# Patient Record
Sex: Male | Born: 1995 | Race: Black or African American | Hispanic: No | Marital: Single | Smoking: Former smoker
Health system: Southern US, Community
[De-identification: ages and names within clinical notes are randomized; demographics above are authoritative.]

## PROBLEM LIST (undated history)

## (undated) DIAGNOSIS — F988 Other specified behavioral and emotional disorders with onset usually occurring in childhood and adolescence: Secondary | ICD-10-CM

## (undated) DIAGNOSIS — J45909 Unspecified asthma, uncomplicated: Secondary | ICD-10-CM

## (undated) DIAGNOSIS — F259 Schizoaffective disorder, unspecified: Secondary | ICD-10-CM

## (undated) DIAGNOSIS — F32A Depression, unspecified: Secondary | ICD-10-CM

## (undated) DIAGNOSIS — F329 Major depressive disorder, single episode, unspecified: Secondary | ICD-10-CM

---
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
## 2015-06-02 ENCOUNTER — Ambulatory Visit (HOSPITAL_COMMUNITY)
Admission: AD | Admit: 2015-06-02 | Discharge: 2015-06-02 | Disposition: A | Discharge status: Home / Self Care | Payer: Medicaid Other | Attending: Psychiatry | Admitting: Psychiatry

## 2015-06-02 NOTE — BH Assessment (Signed)
Tele Assessment Note   Lawrence [name] is an 19 y.o. male. Pt denies  SI. Reports passive HI. Pt reports depressive symptoms, anxiety, and anger issues. Pt reports ongoing issues with his mother. Pt reports current drug use. Pt states he uses marijuana, cocaine, Xanax, and alcohol. Pt denies current mental health treatment. Pt admitted to previous outpatient treatment. Pt states he did not follow through with therapy or medication management. Pt reports the following depressive symptoms: (a) depressed mood most of the day, (b) difficulty getting out of bed, (c) irritablity, (d), and increased anger. Pt denies previous abuse.   Writer consulted with Dr. Dub Mikes. Dr. Dub Mikes sat down with the Pt to access the Pt and his current situation. After Dr. Dub Mikes had a discussion with the Pt he recommended outpatient resources. Pt provided with outpatient resources.   Diagnosis:  Major Depressive Disorder, Recurrent, Severe  Past Medical History:  Past Medical History  Diagnosis Date  . Asthma   . Depressed   . ADD (attention deficit disorder)     No past surgical history on file.  Family History: No family history on file.  Social History:  reports that he has been smoking.  He does not have any smokeless tobacco history on file. He reports that he uses illicit drugs (Marijuana). He reports that he does not drink alcohol.  Additional Social History:  Alcohol / Drug Use Pain Medications: Pt denies Prescriptions: Pt denies  Over the Counter: Pt denies History of alcohol / drug use?: Yes Longest period of sobriety (when/how long): NA  Substance #1 Name of Substance 1: Cocaine 1 - Age of First Use: 15 1 - Amount (size/oz): Unknown 1 - Frequency: NA 1 - Duration: NA 1 - Last Use / Amount: NA Substance #2 Name of Substance 2: Alcohol 2 - Age of First Use: 15 2 - Amount (size/oz): Unknown 2 - Frequency: NA 2 - Duration: NA 2 - Last Use / Amount: NA Substance #3 Name of Substance 3: Marijuana 3 -  Age of First Use: 16 3 - Amount (size/oz): Unknown 3 - Frequency: NA 3 - Duration: NA 3 - Last Use / Amount: NA  CIWA:   COWS:    PATIENT STRENGTHS: (choose at least two) Average or above average intelligence Communication skills  Allergies:  Allergies  Allergen Reactions  . Apple   . Corn-Containing Products     Home Medications:  (Not in a hospital admission)  OB/GYN Status:  No LMP for male patient.  General Assessment Data Location of Assessment: [facility] TTS Assessment: In system Is this a Tele or Face-to-Face Assessment?: Face-to-Face Is this an Initial Assessment or a Re-assessment for this encounter?: Initial Assessment Marital status: Single Maiden name: NA Is patient pregnant?: No Pregnancy Status: No Living Arrangements: Parent Can pt return to current living arrangement?: Yes Admission Status: Voluntary Is patient capable of signing voluntary admission?: Yes Referral Source: Self/Family/Friend Insurance type: Medicaid     Crisis Care Plan Living Arrangements: Parent Name of Psychiatrist: NA Name of Therapist: NA  Education Status Is patient currently in school?: Yes Current Grade: freshmen in college Highest grade of school patient has completed: 12 Name of school: NA Contact person: NA  Risk to self with the past 6 months Suicidal Ideation: No Has patient been a risk to self within the past 6 months prior to admission? : No Suicidal Intent: No Has patient had any suicidal intent within the past 6 months prior to admission? : No Is patient  at risk for suicide?: Yes (excessive drug use) Suicidal Plan?: No Has patient had any suicidal plan within the past 6 months prior to admission? : No Access to Means: No What has been your use of drugs/alcohol within the last 12 months?: cocaine, xanax, marijuana, alcohol Previous Attempts/Gestures: No How many times?: 0 Other Self Harm Risks: NA Triggers for Past Attempts: None  known Intentional Self Injurious Behavior: None Family Suicide History: No Recent stressful life event(s): Conflict (Comment) (conflict with mother) Persecutory voices/beliefs?: No Depression: Yes Depression Symptoms: Despondent, Insomnia, Tearfulness, Isolating, Fatigue, Guilt, Feeling worthless/self pity, Loss of interest in usual pleasures, Feeling angry/irritable Substance abuse history and/or treatment for substance abuse?: No Suicide prevention information given to non-admitted patients: Not applicable  Risk to Others within the past 6 months Homicidal Ideation: Yes-Currently Present Does patient have any lifetime risk of violence toward others beyond the six months prior to admission? : No Thoughts of Harm to Others: Yes-Currently Present Comment - Thoughts of Harm to Others: Pt states thoughts of harm for everyone Current Homicidal Intent: No Current Homicidal Plan: No Access to Homicidal Means: No Identified Victim: Pt states everybody History of harm to others?: No Assessment of Violence: On admission Violent Behavior Description: verbal altercations Does patient have access to weapons?: No Criminal Charges Pending?: No Does patient have a court date: No Is patient on probation?: No  Psychosis Hallucinations: None noted Delusions: None noted  Mental Status Report Appearance/Hygiene: Unremarkable Eye Contact: Fair Motor Activity: Freedom of movement Speech: Logical/coherent Level of Consciousness: Alert Mood: Depressed Affect: Depressed Anxiety Level: None Thought Processes: Coherent, Relevant Judgement: Unimpaired Orientation: Person, Place, Time, Situation, Appropriate for developmental age Obsessive Compulsive Thoughts/Behaviors: None  Cognitive Functioning Concentration: Normal Memory: Recent Intact, Remote Intact IQ: Average Insight: Poor Impulse Control: Poor Appetite: Fair Weight Loss: 0 Weight Gain: 0 Sleep: Decreased Total Hours of Sleep:  5 Vegetative Symptoms: None  ADLScreening [facility]) Patient's cognitive ability adequate to safely complete daily activities?: Yes Patient able to express need for assistance with ADLs?: Yes Independently performs ADLs?: Yes (appropriate for developmental age)  Prior Inpatient Therapy Prior Inpatient Therapy: No Prior Therapy Dates: NA Prior Therapy Facilty/Provider(s): NA Reason for Treatment: NA  Prior Outpatient Therapy Prior Outpatient Therapy: No Prior Therapy Dates: NA Prior Therapy Facilty/Provider(s): NA Reason for Treatment: NA Does patient have an ACCT team?: No Does patient have Intensive In-House Services?  : No Does patient have [facility] services? : No Does patient have P4CC services?: No  ADL Screening (condition at time of admission) Patient's cognitive ability adequate to safely complete daily activities?: Yes Is the patient deaf or have difficulty hearing?: No Does the patient have difficulty seeing, even when wearing glasses/contacts?: No Does the patient have difficulty concentrating, remembering, or making decisions?: No Patient able to express need for assistance with ADLs?: Yes Does the patient have difficulty dressing or bathing?: No Independently performs ADLs?: Yes (appropriate for developmental age) Does the patient have difficulty walking or climbing stairs?: No Weakness of Legs: None Weakness of Arms/Hands: None       Abuse/Neglect Assessment (Assessment to be complete while patient is alone) Physical Abuse: Denies Verbal Abuse: Denies Sexual Abuse: Denies Exploitation of patient/patient's resources: Denies Self-Neglect: Denies Values / Beliefs Cultural Requests During Hospitalization: None Spiritual Requests During Hospitalization: None   Advance Directives (For Healthcare) Does patient have an advance directive?: No Would patient like information on creating an advanced directive?: No - patient declined information     Additional Information  1:1 In Past 12 Months?: No CIRT Risk: No Elopement Risk: No Does patient have medical clearance?: No     Disposition:  Disposition Initial Assessment Completed for this Encounter: Yes Disposition of Patient: Outpatient treatment (Provided outpatient resources)  Theona Muhs D 06/02/2015 6:59 PM

## 2015-08-11 ENCOUNTER — Encounter (HOSPITAL_COMMUNITY): Payer: Self-pay | Admitting: Neurology

## 2015-08-11 ENCOUNTER — Emergency Department (HOSPITAL_COMMUNITY): Payer: Medicaid Other

## 2015-08-11 ENCOUNTER — Emergency Department (HOSPITAL_COMMUNITY)
Admission: EM | Admit: 2015-08-11 | Discharge: 2015-08-11 | Disposition: A | Discharge status: Home / Self Care | Payer: Medicaid Other | Attending: Emergency Medicine | Admitting: Emergency Medicine

## 2015-08-11 DIAGNOSIS — F17200 Nicotine dependence, unspecified, uncomplicated: Secondary | ICD-10-CM | POA: Diagnosis not present

## 2015-08-11 DIAGNOSIS — J45909 Unspecified asthma, uncomplicated: Secondary | ICD-10-CM | POA: Diagnosis not present

## 2015-08-11 DIAGNOSIS — R079 Chest pain, unspecified: Secondary | ICD-10-CM | POA: Diagnosis present

## 2015-08-11 DIAGNOSIS — R002 Palpitations: Secondary | ICD-10-CM | POA: Diagnosis not present

## 2015-08-11 DIAGNOSIS — Z8659 Personal history of other mental and behavioral disorders: Secondary | ICD-10-CM | POA: Insufficient documentation

## 2015-08-11 LAB — I-STAT CHEM 8, ED
BUN: 9 mg/dL (ref 6–20)
Calcium, Ion: 1.19 mmol/L (ref 1.12–1.23)
Chloride: 99 mmol/L — ABNORMAL LOW (ref 101–111)
Creatinine, Ser: 1.1 mg/dL (ref 0.61–1.24)
GLUCOSE: 84 mg/dL (ref 65–99)
HEMATOCRIT: 48 % (ref 39.0–52.0)
Hemoglobin: 16.3 g/dL (ref 13.0–17.0)
POTASSIUM: 4.8 mmol/L (ref 3.5–5.1)
Sodium: 138 mmol/L (ref 135–145)
TCO2: 28 mmol/L (ref 0–100)

## 2015-08-11 NOTE — ED Notes (Signed)
Pt stable, ambulatory, states understanding of discharge instructions 

## 2015-08-11 NOTE — ED Notes (Signed)
Pt reports today felt like "his heart was hurting" felt that his heart was "bloated". He was driving homer when the feeling started. Pt denies any s/s at current.

## 2015-08-11 NOTE — ED Provider Notes (Signed)
CSN: [id number]     Arrival date & time 08/11/15  1413 History   First MD Initiated Contact with Patient 08/11/15 1559     Chief Complaint  Patient presents with  . Chest Pain      HPI Patient presents to the emergency department complaining of an abnormal sensation in his chest.  He feels like the left side of his chest was slightly swollen when he looked in the mirror.  He started feeling short of breath.  He also had a sensation of palpitations.  He does admit to drinking energy drinks daily.  He denies significant shortness of breath.  He is asymptomatic at this time.  No near-syncope or syncope.  Palpitations were transient.  Denies unilateral leg swelling.  No history DVT or pulmonary embolism.   Past Medical History  Diagnosis Date  . Asthma   . Depressed   . ADD (attention deficit disorder)    History reviewed. No pertinent past surgical history. No family history on file. Social History  Substance Use Topics  . Smoking status: Current Some Day Smoker  . Smokeless tobacco: None  . Alcohol Use: No    Review of Systems  All other systems reviewed and are negative.     Allergies  Bee venom; Apple; Corn-containing products; Lac bovis; and Other  Home Medications   Prior to Admission medications   Medication Sig Start Date End Date Taking? Authorizing Provider  EPINEPHrine (EPIPEN JR) 0.15 MG/0.3ML injection Inject 0.15 mg into the muscle once. 12/12/11  Yes Historical Provider, MD  fluticasone (FLONASE) 50 MCG/ACT nasal spray Place 1 spray into both nostrils daily as needed. 12/01/12  Yes Historical Provider, MD  loratadine (CLARITIN) 10 MG tablet Take 10 mg by mouth daily as needed for allergies.   Yes Historical Provider, MD  montelukast (SINGULAIR) 10 MG tablet Take 10 mg by mouth at bedtime as needed (allergies, asthma).  12/12/11  Yes Historical Provider, MD   BP 115/75 mmHg  Pulse 70  Temp(Src) 98.4 F (36.9 C) (Oral)  Resp 11  SpO2 100% Physical Exam   Constitutional: He is oriented to person, place, and time. He appears well-developed and well-nourished.  HENT:  Head: Normocephalic and atraumatic.  Eyes: EOM are normal.  Neck: Normal range of motion.  Cardiovascular: Normal rate, regular rhythm, normal heart sounds and intact distal pulses.   Pulmonary/Chest: Effort normal and breath sounds normal. No respiratory distress.  Abdominal: Soft. He exhibits no distension. There is no tenderness.  Musculoskeletal: Normal range of motion.  Neurological: He is alert and oriented to person, place, and time.  Skin: Skin is warm and dry.  Psychiatric: He has a normal mood and affect. Judgment normal.  Nursing note and vitals reviewed.   ED Course  Procedures (including critical care time) Labs Review Labs Reviewed  I-STAT CHEM 8, ED - Abnormal; Notable for the following:    Chloride 99 (*)    All other components within normal limits    Imaging Review Dg Chest 2 View  08/11/2015  CLINICAL DATA:  Mid epigastric pain. Shortness of breath, palpitations. Smoker, asthma. EXAM: CHEST  2 VIEW COMPARISON:  None. FINDINGS: The heart size and mediastinal contours are within normal limits. Both lungs are clear. The visualized skeletal structures are unremarkable. IMPRESSION: No active cardiopulmonary disease. Electronically Signed   By: Charlett Nose M.D.   On: 08/11/2015 15:09   I have personally reviewed and evaluated these images and lab results as part of my medical decision-making.  EKG Interpretation   Date/Time:  Thursday August 11 2015 14:25:29 EST Ventricular Rate:  95 PR Interval:  146 QRS Duration: 92 QT Interval:  332 QTC Calculation: 417 R Axis:   -143 Text Interpretation:  Normal sinus rhythm Right superior axis deviation  Possible Right ventricular hypertrophy Abnormal ECG No significant change  was found Confirmed by Katrina Daddona  MD, Caryn BeeKEVIN ([id number]) on 08/11/2015 4:23:19 PM      MDM   Final diagnoses:  Chest pain,  unspecified chest pain type  Palpitations     Patient feels good at this time.  EKG and chest x-ray without abnormality.  Likely much of this was secondary to anxiety.  He is dealing with some depression and is currently in a very introspective state.  He is scheduled to follow-up with his psychiatrist as well.  He is with his mother.  All questions were answered.  He understands return to the ER for new or worsening symptoms.  Azalia BilisKevin Zuha Dejonge, MD 08/11/15 [phone]

## 2016-05-12 ENCOUNTER — Emergency Department (HOSPITAL_COMMUNITY)
Admission: EM | Admit: 2016-05-12 | Discharge: 2016-05-13 | Disposition: A | Discharge status: Other Acute Inpatient Hospital | Payer: Medicaid Other | Attending: Emergency Medicine | Admitting: Emergency Medicine

## 2016-05-12 ENCOUNTER — Encounter (HOSPITAL_COMMUNITY): Payer: Self-pay

## 2016-05-12 DIAGNOSIS — F918 Other conduct disorders: Secondary | ICD-10-CM | POA: Insufficient documentation

## 2016-05-12 DIAGNOSIS — F6381 Intermittent explosive disorder: Secondary | ICD-10-CM

## 2016-05-12 DIAGNOSIS — F1994 Other psychoactive substance use, unspecified with psychoactive substance-induced mood disorder: Secondary | ICD-10-CM | POA: Diagnosis present

## 2016-05-12 DIAGNOSIS — Z79899 Other long term (current) drug therapy: Secondary | ICD-10-CM | POA: Insufficient documentation

## 2016-05-12 DIAGNOSIS — F1220 Cannabis dependence, uncomplicated: Secondary | ICD-10-CM | POA: Diagnosis present

## 2016-05-12 DIAGNOSIS — J45909 Unspecified asthma, uncomplicated: Secondary | ICD-10-CM | POA: Insufficient documentation

## 2016-05-12 DIAGNOSIS — F909 Attention-deficit hyperactivity disorder, unspecified type: Secondary | ICD-10-CM | POA: Insufficient documentation

## 2016-05-12 DIAGNOSIS — F17200 Nicotine dependence, unspecified, uncomplicated: Secondary | ICD-10-CM | POA: Insufficient documentation

## 2016-05-12 DIAGNOSIS — R4689 Other symptoms and signs involving appearance and behavior: Secondary | ICD-10-CM

## 2016-05-12 LAB — RAPID URINE DRUG SCREEN, HOSP PERFORMED
AMPHETAMINES: NOT DETECTED
BARBITURATES: NOT DETECTED
BENZODIAZEPINES: NOT DETECTED
COCAINE: NOT DETECTED
Opiates: NOT DETECTED
TETRAHYDROCANNABINOL: POSITIVE — AB

## 2016-05-12 LAB — COMPREHENSIVE METABOLIC PANEL
ALBUMIN: 4.6 g/dL (ref 3.5–5.0)
ALT: 25 U/L (ref 17–63)
ANION GAP: 6 (ref 5–15)
AST: 32 U/L (ref 15–41)
Alkaline Phosphatase: 59 U/L (ref 38–126)
BUN: 9 mg/dL (ref 6–20)
CHLORIDE: 104 mmol/L (ref 101–111)
CO2: 28 mmol/L (ref 22–32)
Calcium: 9.5 mg/dL (ref 8.9–10.3)
Creatinine, Ser: 1.05 mg/dL (ref 0.61–1.24)
GFR calc non Af Amer: >60 mL/min (ref >60)
GLUCOSE: 106 mg/dL — AB (ref 65–99)
Potassium: 4.2 mmol/L (ref 3.5–5.1)
SODIUM: 138 mmol/L (ref 135–145)
Total Bilirubin: 0.5 mg/dL (ref 0.3–1.2)
Total Protein: 7.6 g/dL (ref 6.5–8.1)

## 2016-05-12 LAB — CBC WITH DIFFERENTIAL/PLATELET
BASOS PCT: 0 %
Basophils Absolute: 0 10*3/uL (ref 0.0–0.1)
EOS PCT: 4 %
Eosinophils Absolute: 0.3 10*3/uL (ref 0.0–0.7)
HEMATOCRIT: 44.2 % (ref 39.0–52.0)
Hemoglobin: 15 g/dL (ref 13.0–17.0)
Lymphocytes Relative: 21 %
Lymphs Abs: 1.6 10*3/uL (ref 0.7–4.0)
MCH: 30.2 pg (ref 26.0–34.0)
MCHC: 33.9 g/dL (ref 30.0–36.0)
MCV: 89.1 fL (ref 78.0–100.0)
MONO ABS: 0.6 10*3/uL (ref 0.1–1.0)
MONOS PCT: 8 %
Neutro Abs: 5 10*3/uL (ref 1.7–7.7)
Neutrophils Relative %: 67 %
PLATELETS: 177 10*3/uL (ref 150–400)
RBC: 4.96 MIL/uL (ref 4.22–5.81)
RDW: 13.3 % (ref 11.5–15.5)
WBC: 7.5 10*3/uL (ref 4.0–10.5)

## 2016-05-12 LAB — ETHANOL

## 2016-05-12 MED ORDER — LORAZEPAM 1 MG PO TABS: 1.0000 mg | ORAL_TABLET | Freq: Three times a day (TID) | ORAL | Status: DC | PRN

## 2016-05-12 MED ORDER — ACETAMINOPHEN 325 MG PO TABS
650.0000 mg | ORAL_TABLET | ORAL | Status: DC | PRN
Start: 2016-05-12 — End: 2016-05-13

## 2016-05-12 NOTE — ED Notes (Signed)
Patient noted sleeping in room. No complaints, stable, in no acute distress. Q15 minute rounds and monitoring via Security Cameras to continue.  

## 2016-05-12 NOTE — ED Notes (Signed)
Pt. Transferred to [facility] from ED to room. Report to include Situation, Background, Assessment and [facility]ations from [facility]. Pt. Oriented to unit including Q15 minute rounds as well as the security cameras for their protection. Patient is alert and oriented, warm and dry in no acute distress. Patient denies SI, HI, and AVH. Pt. Encouraged to let me know if needs arise.
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
## 2016-05-12 NOTE — BH Assessment (Signed)
[facility] Assessment Progress Note   Case was staffed with Lord DNP who recommended patient be re-evaluated in the a.m.    
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
## 2016-05-12 NOTE — ED Notes (Signed)
Pt wanded by security. 

## 2016-05-12 NOTE — ED Notes (Signed)
[facility]: [facility] Expected date:  Expected time:  Means of arrival:  Comments: 
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
## 2016-05-13 ENCOUNTER — Encounter (HOSPITAL_COMMUNITY): Payer: Self-pay | Admitting: *Deleted

## 2016-05-13 ENCOUNTER — Inpatient Hospital Stay (HOSPITAL_COMMUNITY)
Admission: AD | Admit: 2016-05-13 | Discharge: 2016-05-18 | DRG: 883 | Disposition: A | Discharge status: Home / Self Care | Payer: Medicaid Other | Attending: Psychiatry | Admitting: Psychiatry

## 2016-05-13 DIAGNOSIS — R45851 Suicidal ideations: Secondary | ICD-10-CM

## 2016-05-13 DIAGNOSIS — F1220 Cannabis dependence, uncomplicated: Secondary | ICD-10-CM | POA: Diagnosis present

## 2016-05-13 DIAGNOSIS — F332 Major depressive disorder, recurrent severe without psychotic features: Secondary | ICD-10-CM | POA: Diagnosis not present

## 2016-05-13 DIAGNOSIS — F6381 Intermittent explosive disorder: Principal | ICD-10-CM | POA: Diagnosis present

## 2016-05-13 DIAGNOSIS — F1994 Other psychoactive substance use, unspecified with psychoactive substance-induced mood disorder: Secondary | ICD-10-CM | POA: Diagnosis present

## 2016-05-13 DIAGNOSIS — Z818 Family history of other mental and behavioral disorders: Secondary | ICD-10-CM | POA: Diagnosis not present

## 2016-05-13 DIAGNOSIS — J45909 Unspecified asthma, uncomplicated: Secondary | ICD-10-CM | POA: Diagnosis present

## 2016-05-13 DIAGNOSIS — F17200 Nicotine dependence, unspecified, uncomplicated: Secondary | ICD-10-CM | POA: Diagnosis present

## 2016-05-13 DIAGNOSIS — Z79899 Other long term (current) drug therapy: Secondary | ICD-10-CM | POA: Diagnosis not present

## 2016-05-13 DIAGNOSIS — F909 Attention-deficit hyperactivity disorder, unspecified type: Secondary | ICD-10-CM | POA: Diagnosis present

## 2016-05-13 DIAGNOSIS — F329 Major depressive disorder, single episode, unspecified: Secondary | ICD-10-CM

## 2016-05-13 DIAGNOSIS — F32A Depression, unspecified: Secondary | ICD-10-CM

## 2016-05-13 MED ORDER — TRAZODONE HCL 50 MG PO TABS
50.0000 mg | ORAL_TABLET | Freq: Every day | ORAL | Status: DC
  Administered 2016-05-13 – 2016-05-16 (×2): 50 mg via ORAL
  Filled 2016-05-13: qty 7
  Filled 2016-05-13 (×7): qty 1

## 2016-05-13 MED ORDER — OXCARBAZEPINE 300 MG PO TABS
300.0000 mg | ORAL_TABLET | Freq: Two times a day (BID) | ORAL | Status: DC
  Administered 2016-05-13: 300 mg via ORAL
  Filled 2016-05-13: qty 1

## 2016-05-13 MED ORDER — MAGNESIUM HYDROXIDE 400 MG/5ML PO SUSP
30.0000 mL | Freq: Every day | ORAL | Status: DC | PRN
Start: 2016-05-13 — End: 2016-05-18

## 2016-05-13 MED ORDER — ACETAMINOPHEN 325 MG PO TABS: 650.0000 mg | ORAL_TABLET | ORAL | Status: DC | PRN

## 2016-05-13 MED ORDER — TRAZODONE HCL 50 MG PO TABS: 50.0000 mg | ORAL_TABLET | Freq: Every day | ORAL | Status: DC

## 2016-05-13 MED ORDER — OXCARBAZEPINE 300 MG PO TABS
300.0000 mg | ORAL_TABLET | Freq: Two times a day (BID) | ORAL | Status: DC
  Administered 2016-05-13 – 2016-05-18 (×10): 300 mg via ORAL
  Filled 2016-05-13 (×7): qty 1
  Filled 2016-05-13: qty 14
  Filled 2016-05-13 (×5): qty 1
  Filled 2016-05-13: qty 14
  Filled 2016-05-13 (×2): qty 1

## 2016-05-13 MED ORDER — ALUM & MAG HYDROXIDE-SIMETH 200-200-20 MG/5ML PO SUSP: 30.0000 mL | ORAL | Status: DC | PRN

## 2016-05-13 NOTE — ED Notes (Signed)
Patient noted sleeping in room. No complaints, stable, in no acute distress. Q15 minute rounds and monitoring via Security Cameras to continue.  

## 2016-05-13 NOTE — ED Notes (Signed)
On the phone 

## 2016-05-13 NOTE — ED Notes (Signed)
Pt ambulatory w/o difficulty to [facility] w/ [facility], belongings given to [employer] 
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
## 2016-05-13 NOTE — ED Notes (Signed)
[facility] here to transport 

## 2016-05-13 NOTE — Tx Team (Signed)
Initial Treatment Plan 05/13/2016 6:39 PM Lawrence Minusameron Knecht [id number]    PATIENT STRESSORS: Educational concerns Financial difficulties Health problems   PATIENT STRENGTHS: Ability for insight Active sense of humor Average or above average intelligence   PATIENT IDENTIFIED PROBLEMS: ADD Asthma MDD Polysubstnace Abuse                " I'm not a bad person"05/13/16  " I love my mother" 9/24/`7   DISCHARGE CRITERIA:  Ability to meet basic life and health needs Adequate post-discharge living arrangements Improved stabilization in mood, thinking, and/or behavior Medical problems require only outpatient monitoring  PRELIMINARY DISCHARGE PLAN: Attend aftercare/continuing care group Attend PHP/IOP Attend 12-step recovery group Outpatient therapy  PATIENT/FAMILY INVOLVEMENT: This treatment plan has been presented to and reviewed with the patient, Lawrence [name], and/or family member, .  The patient and family have been given the opportunity to ask questions and make suggestions.  Rich Braveuke, Donoven Pett Lynn, RN 05/13/2016, 6:39 PM

## 2016-05-13 NOTE — ED Notes (Signed)
[facility] contacted for transport 

## 2016-05-13 NOTE — Consult Note (Signed)
[facility] Face-to-Face Psychiatry Consult   Reason for Consult: suicidal thoughts, aggressive behavior Referring Physician: EDP Patient Identification: Lawrence [name] MRN:  [id number] Principal Diagnosis: Intermittent explosive disorder Diagnosis:   Patient Active Problem List   Diagnosis Date Noted  . Intermittent explosive disorder [F63.81] 05/13/2016    Priority: High  . Cannabis use disorder, severe, dependence (HCC) [F12.20] 05/13/2016    Priority: High  . Substance induced mood disorder (HCC) [F19.94] 05/13/2016    Priority: High  . Intoxication by drug [facility]) [Z57.471] 07/12/2014  . Intoxication [R69] 07/12/2014  . Depression [F32.9] 07/12/2014  . Overdose of antitussive [T48.3X1A]     Total Time spent with patient: 45 minutes  Subjective:   Lawrence [name] is a 20 y.o. male patient admitted with physically aggressive.  HPI:  Lawrence [name] is an 20 y.o. male with past history of Polysubstance abuse, ADHD, Anxiety and depression. Patient was brought to [facility] under IVC taking out by his mother. Patient was interviewed in the presence of his mother and he reported that he was brought to the hospital after arguments with his mother. Mother reports that patient verbalized suicidal thoughts yesterday and stated that he wanted to die and be with Jesus. Patient has been getting increasingly agitated, hostile, physically aggressive towards his mother and has been punching holes in the wall, throws things, yells and cries for no reasons. Patient reports that he has been prescribed medications in the past but never took it. He has been self medicating with drugs.Patient reports prior history of  cocaine, Xanax, alcohol abuse and current addiction to Marijuana.  Patient is extremely labile but denies delusions, psychosis, SH/HI.  Past Psychiatric History: as above  Risk to Self: Suicidal Ideation: No Suicidal Intent: No Is patient at risk for suicide?: No Suicidal Plan?: No Access to Means:  No What has been your use of drugs/alcohol within the last 12 months?: denies current use How many times?: 1 Other Self Harm Risks: none Triggers for Past Attempts: Unknown Intentional Self Injurious Behavior: None Risk to Others: Homicidal Ideation: No Thoughts of Harm to Others: No Current Homicidal Intent: No Current Homicidal Plan: No Access to Homicidal Means: No Identified Victim: na History of harm to others?: No Assessment of Violence: None Noted Violent Behavior Description: patient did punch hole in wall Does patient have access to weapons?: No Criminal Charges Pending?: No Does patient have a court date: No Prior Inpatient Therapy: Prior Inpatient Therapy: No Prior Therapy Dates: na Prior Therapy Facilty/Provider(s): na Reason for Treatment: na Prior Outpatient Therapy: Prior Outpatient Therapy: No Prior Therapy Dates: na Prior Therapy Facilty/Provider(s): na Reason for Treatment: na Does patient have an ACCT team?: No Does patient have Intensive In-House Services?  : No Does patient have [facility] services? : No Does patient have P4CC services?: No  Past Medical History:  Past Medical History:  Diagnosis Date  . ADD (attention deficit disorder)   . Asthma   . Depressed    History reviewed. No pertinent surgical history. Family History: No family history on file. Family Psychiatric  History: Social History:  History  Alcohol Use No     History  Drug Use  . Types: Marijuana    Social History   Social History  . Marital status: Single    Spouse name: N/A  . Number of children: N/A  . Years of education: N/A   Social History Main Topics  . Smoking status: Current Some Day Smoker  . Smokeless tobacco: Never Used  . Alcohol use  No  . Drug use:     Types: Marijuana  . Sexual activity: Not Asked   Other Topics Concern  . None   Social History Narrative  . None   Additional Social History:    Allergies:   Allergies  Allergen Reactions  .  Bee Venom Anaphylaxis  . Apple   . Corn-Containing Products   . Lac Bovis Itching    Milk Related  . Other     Green Beans:   Other reaction(s): ASTHMA, RASH    Labs:  Results for orders placed or performed during the hospital encounter of 05/12/16 (from the past 48 hour(s))  Comprehensive metabolic panel     Status: Abnormal   Collection Time: 05/12/16  5:40 PM  Result Value Ref Range   Sodium 138 135 - 145 mmol/L   Potassium 4.2 3.5 - 5.1 mmol/L   Chloride 104 101 - 111 mmol/L   CO2 28 22 - 32 mmol/L   Glucose, Bld 106 (H) 65 - 99 mg/dL   BUN 9 6 - 20 mg/dL   Creatinine, Ser 1.05 0.61 - 1.24 mg/dL   Calcium 9.5 8.9 - 10.3 mg/dL   Total Protein 7.6 6.5 - 8.1 g/dL   Albumin 4.6 3.5 - 5.0 g/dL   AST 32 15 - 41 U/L   ALT 25 17 - 63 U/L   Alkaline Phosphatase 59 38 - 126 U/L   Total Bilirubin 0.5 0.3 - 1.2 mg/dL   GFR calc non Af Amer >60 >60 mL/min   GFR calc Af Amer >60 >60 mL/min    Comment: (NOTE) The eGFR has been calculated using the CKD EPI equation. This calculation has not been validated in all clinical situations. eGFR's persistently <60 mL/min signify possible Chronic Kidney Disease.    Anion gap 6 5 - 15  Urine rapid drug screen (hosp performed)     Status: Abnormal   Collection Time: 05/12/16  5:40 PM  Result Value Ref Range   Opiates NONE DETECTED NONE DETECTED   Cocaine NONE DETECTED NONE DETECTED   Benzodiazepines NONE DETECTED NONE DETECTED   Amphetamines NONE DETECTED NONE DETECTED   Tetrahydrocannabinol POSITIVE (A) NONE DETECTED   Barbiturates NONE DETECTED NONE DETECTED    Comment:        DRUG SCREEN FOR MEDICAL PURPOSES ONLY.  IF CONFIRMATION IS NEEDED FOR ANY PURPOSE, NOTIFY LAB WITHIN 5 DAYS.        LOWEST DETECTABLE LIMITS FOR URINE DRUG SCREEN Drug Class       Cutoff (ng/mL) Amphetamine      1000 Barbiturate      200 Benzodiazepine   762 Tricyclics       831 Opiates          300 Cocaine          300 THC              50   Ethanol      Status: None   Collection Time: 05/12/16  5:40 PM  Result Value Ref Range   Alcohol, Ethyl (B) <5 <5 mg/dL    Comment:        LOWEST DETECTABLE LIMIT FOR SERUM ALCOHOL IS 5 mg/dL FOR MEDICAL PURPOSES ONLY   CBC with Differential     Status: None   Collection Time: 05/12/16  5:40 PM  Result Value Ref Range   WBC 7.5 4.0 - 10.5 K/uL   RBC 4.96 4.22 - 5.81 MIL/uL   Hemoglobin 15.0 13.0 - 17.0  g/dL   HCT 44.2 39.0 - 52.0 %   MCV 89.1 78.0 - 100.0 fL   MCH 30.2 26.0 - 34.0 pg   MCHC 33.9 30.0 - 36.0 g/dL   RDW 13.3 11.5 - 15.5 %   Platelets 177 150 - 400 K/uL   Neutrophils Relative % 67 %   Neutro Abs 5.0 1.7 - 7.7 K/uL   Lymphocytes Relative 21 %   Lymphs Abs 1.6 0.7 - 4.0 K/uL   Monocytes Relative 8 %   Monocytes Absolute 0.6 0.1 - 1.0 K/uL   Eosinophils Relative 4 %   Eosinophils Absolute 0.3 0.0 - 0.7 K/uL   Basophils Relative 0 %   Basophils Absolute 0.0 0.0 - 0.1 K/uL    Current Facility-Administered Medications  Medication Dose Route Frequency Provider Last Rate Last Dose  . acetaminophen (TYLENOL) tablet 650 mg  650 mg Oral Q4H PRN Davonna Belling, MD      . Oxcarbazepine (TRILEPTAL) tablet 300 mg  300 mg Oral BID Corena Pilgrim, MD      . traZODone (DESYREL) tablet 50 mg  50 mg Oral QHS Corena Pilgrim, MD       Current Outpatient Prescriptions  Medication Sig Dispense Refill  . EPINEPHrine (EPIPEN JR) 0.15 MG/0.3ML injection Inject 0.15 mg into the muscle as needed for anaphylaxis.       Musculoskeletal: Strength & Muscle Tone: within normal limits Gait & Station: normal Patient leans: N/A  Psychiatric Specialty Exam: Physical Exam  Psychiatric: His affect is angry and labile. He is agitated, aggressive and combative. Cognition and memory are normal. He expresses impulsivity. He expresses suicidal ideation.    Review of Systems  Constitutional: Negative.   HENT: Negative.   Eyes: Negative.   Respiratory: Negative.   Cardiovascular: Negative.    Gastrointestinal: Negative.   Genitourinary: Negative.   Musculoskeletal: Negative.   Skin: Negative.   Neurological: Negative.   Endo/Heme/Allergies: Negative.   Psychiatric/Behavioral: Positive for substance abuse. The patient has insomnia.     Blood pressure 102/55, pulse 89, temperature 97.7 F (36.5 C), temperature source Oral, resp. rate 17, height '6\' 2"'$  (1.88 m), weight 88.5 kg (195 lb), SpO2 93 %.Body mass index is 25.04 kg/m.  General Appearance: Disheveled  Eye Contact:  Good  Speech:  Pressured  Volume:  Increased  Mood:  Irritable  Affect:  Labile  Thought Process:  Coherent and Descriptions of Associations: Loose  Orientation:  Full (Time, Place, and Person)  Thought Content:  Obsessions  Suicidal Thoughts:  Yes.  without intent/plan  Homicidal Thoughts:  No  Memory:  Immediate;   Good Recent;   Good Remote;   Good  Judgement:  Impaired  Insight:  Shallow  Psychomotor Activity:  Increased  Concentration:  Concentration: Poor and Attention Span: Poor  Recall:  Good  Fund of Knowledge:  Good  Language:  Good  Akathisia:  No  Handed:  Right  AIMS (if indicated):     Assets:  Social Support  ADL's:  Intact  Cognition:  WNL  Sleep:   poor     Treatment Plan Summary: Daily contact with patient to assess and evaluate symptoms and progress in treatment, Medication management Start Trileptal 300 mg bid for mood stabilization Start Trazodone 50 mg qhs prn for sleep  Disposition: Recommend psychiatric Inpatient admission when medically cleared. Supportive therapy provided about ongoing stressors. Needs inpatient admission for stabilization  Corena Pilgrim, MD 05/13/2016 11:07 AM

## 2016-05-14 DIAGNOSIS — F1994 Other psychoactive substance use, unspecified with psychoactive substance-induced mood disorder: Secondary | ICD-10-CM

## 2016-05-14 DIAGNOSIS — F332 Major depressive disorder, recurrent severe without psychotic features: Secondary | ICD-10-CM

## 2016-05-14 DIAGNOSIS — F1220 Cannabis dependence, uncomplicated: Secondary | ICD-10-CM

## 2016-05-14 DIAGNOSIS — Z79899 Other long term (current) drug therapy: Secondary | ICD-10-CM

## 2016-05-14 DIAGNOSIS — Z791 Long term (current) use of non-steroidal anti-inflammatories (NSAID): Secondary | ICD-10-CM

## 2016-05-14 DIAGNOSIS — F6381 Intermittent explosive disorder: Principal | ICD-10-CM

## 2016-05-14 LAB — TSH: TSH: 0.913 u[IU]/mL (ref 0.350–4.500)

## 2016-05-14 MED ORDER — ESCITALOPRAM OXALATE 10 MG PO TABS
10.0000 mg | ORAL_TABLET | Freq: Every day | ORAL | Status: DC
  Administered 2016-05-14 – 2016-05-18 (×5): 10 mg via ORAL
  Filled 2016-05-14: qty 7
  Filled 2016-05-14 (×6): qty 1

## 2016-05-14 MED ORDER — NICOTINE POLACRILEX 2 MG MT GUM: 2.0000 mg | CHEWING_GUM | OROMUCOSAL | Status: DC | PRN

## 2016-05-14 NOTE — Progress Notes (Signed)
Adult Psychoeducational Group Note  Date:  05/14/2016 Time:  10:37 PM  Group Topic/Focus:  Wrap-Up Group:   The focus of this group is to help patients review their daily goal of treatment and discuss progress on daily workbooks.   Participation Level:  Active  Participation Quality:  Appropriate  Affect:  Appropriate  Cognitive:  Alert  Insight: Appropriate  Engagement in Group:  Engaged  Modes of Intervention:  Discussion  Additional Comments:  On a scale between 1-10, (1=worse, 10=best) patient rated his day a an 8. Patient states he maintained a good mood. Patient's goal for today was to control his anger and bitterness. Patient met goal.  Lawrence [name] 05/14/2016, 10:37 PM

## 2016-05-14 NOTE — [facility] Group Notes (Signed)
[facility] LCSW Group Therapy  05/14/2016 1:15pm  Type of Therapy:  Group Therapy vercoming Obstacles  Participation Level:  Active  Participation Quality:  Appropriate   Affect:  Appropriate  Cognitive:  Appropriate and Oriented  Insight:  Developing/Improving and Improving  Engagement in Therapy:  Improving  Modes of Intervention:  Discussion, Exploration, Problem-solving and Support  Description of Group:   In this group patients will be encouraged to explore what they see as obstacles to their own wellness and recovery. They will be guided to discuss their thoughts, feelings, and behaviors related to these obstacles. The group will process together ways to cope with barriers, with attention given to specific choices patients can make. Each patient will be challenged to identify changes they are motivated to make in order to overcome their obstacles. This group will be process-oriented, with patients participating in exploration of their own experiences as well as giving and receiving support and challenge from other group members.  Summary of Patient Progress: Pt identified social anxiety as an obstacle that he is facing and expresses a desire to have meaningful relationships. Pt reports that he is highly motivated to become more socially aware and engage with other people. Pt interacted well with peers and offered support and encouragement.   Therapeutic Modalities:   Cognitive Behavioral Therapy Solution Focused Therapy Motivational Interviewing Relapse Prevention Therapy   Chad CordialLauren Carter, LCSWA 05/14/2016 4:29 PM

## 2016-05-14 NOTE — H&P (Signed)
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 Intermittent explosive disorder [F63.81] 05/13/2016  . Cannabis use disorder, severe, dependence ([place]) [F12.20] 05/13/2016  . Substance induced mood disorder ([place]) [F19.94] 05/13/2016  . Intoxication by drug [facility]) [E72.094] 07/12/2014  . Intoxication [R69] 07/12/2014  . Depression [F32.9] 07/12/2014  . Overdose of antitussive [T48.3X1A]    History of Present Illness: Lawrence Harrisis an 20 y.o.male with past history of Polysubstance use disorder--alcohol,cocain and cannabis-, ADHD, Anxiety and depression. Patient was brought to [facility] under IVC taken out by his mother. At the [facility] Patient was interviewed in the presence of his mother and he reported that he was brought to the hospital after arguments with his mother. Mother reported that patient verbalized suicidal thoughts the day before and stated that he wanted to die and be with Jesus. She also reported that  Patient had been getting increasingly agitated, hostile, physically aggressive towards her and had been punching holes in the wall along with throwing things, yelling and crying for no reasons. Patient now reports that he has been prescribed adderral  in the past but never took it. He has been self medicating with drugs though he is quick to admit that cannabis increases his paranoia.Patient reports prior history of  cocaine, Xanax, alcohol abuse and current addiction to Marijuana.   Associated Signs/Symptoms: Depression Symptoms:  depressed mood, psychomotor agitation, feelings of worthlessness/guilt, suicidal thoughts without plan, anxiety, (Hypo) Manic Symptoms:  None Anxiety Symptoms:  None Psychotic Symptoms:   None PTSD Symptoms: None Total Time spent with patient: 1 hour  Past Psychiatric History: History of ADHD and was on [place] the last year of High School but none since graduation. Had both school and outside counseling last year of High School  Is the patient at risk to self? No.  Has the patient been a risk to self in the past 6 months? Yes.    Has the patient been a risk to self within the distant past? Yes.    Is the patient a risk to others? No.  Has the patient been a risk to others in the past 6 months? Yes.   Yelling at [facility] who felt threatened Has the patient been a risk to others within the distant past? Yes.   Yelling at [facility] who felt threatened  Prior Inpatient Therapy:  None Prior Outpatient Therapy:   See above  Alcohol Screening: Patient refused Alcohol Screening Tool: Yes 1. How often do you have a drink containing alcohol?: Monthly or less 2. How many drinks containing alcohol do you have on a typical day when you are drinking?: 3 or 4 3. How often do you have six or more drinks on one occasion?: Less than monthly Preliminary Score: 2 4. How often during the last year have you found that you were not able to stop drinking once you had started?: Never 5. How often during the last year have you failed to do what was normally expected from you becasue of drinking?: Never 6. How often during the last year have you needed a first drink in the morning to get yourself going after a heavy drinking session?: Never 7. How often during the last year have you had a feeling of guilt of remorse after drinking?: Never 8. How often during  the last year have you been unable to remember what happened the night before because you had been drinking?: Never 9. Have you or someone else been injured as a result of your drinking?: No 10. Has a relative or friend or a doctor or another health worker been concerned about your drinking or suggested you cut down?: No Alcohol Use Disorder  Identification Test Final Score (AUDIT): 3 Brief Intervention: AUDIT score less than 7 or less-screening does not suggest unhealthy drinking-brief intervention not indicated Substance Abuse History in the last 12 months:  Yes.   Consequences of Substance Abuse: Legal Consequences:  underage drinking ata 18 charge. Did 5 hours of community service Previous Psychotropic Medications: Yes  Psychological Evaluations: No  Past Medical History:  Past Medical History:  Diagnosis Date  . ADD (attention deficit disorder)   . Asthma   . Depressed    History reviewed. No pertinent surgical history. Family History: History reviewed. No pertinent family history. Family Psychiatric  History: Mother, Maternal Grandmother aunt have depression, Father has made 2 suicide attempts and a cousin completed suicide Tobacco Screening: Have you used any form of tobacco in the last 30 days? (Cigarettes, Smokeless Tobacco, Cigars, and/or Pipes): Yes Tobacco use, Select all that apply: 4 or less cigarettes per day Are you interested in Tobacco Cessation Medications?: Yes, will notify MD for an order Counseled patient on smoking cessation including recognizing danger situations, developing coping skills and basic information about quitting provided: Yes Social History:  History  Alcohol Use No     History  Drug Use  . Types: Marijuana    Additional Social History:      Pain Medications: n/a History of alcohol / drug use?: Yes Longest period of sobriety (when/how long): drinks  few drinks a week Negative Consequences of Use: Legal Withdrawal Symptoms: Agitation, Aggressive/Assaultive                    Allergies:   Allergies  Allergen Reactions  . Bee Venom Anaphylaxis  . Apple   . Corn-Containing Products   . Lac Bovis Itching    Milk Related  . Other     Green Beans:   Other reaction(s): ASTHMA, RASH   Lab Results:  Results for orders placed or performed during the hospital encounter  of 05/12/16 (from the past 48 hour(s))  Comprehensive metabolic panel     Status: Abnormal   Collection Time: 05/12/16  5:40 PM  Result Value Ref Range   Sodium 138 135 - 145 mmol/L   Potassium 4.2 3.5 - 5.1 mmol/L   Chloride 104 101 - 111 mmol/L   CO2 28 22 - 32 mmol/L   Glucose, Bld 106 (H) 65 - 99 mg/dL   BUN 9 6 - 20 mg/dL   Creatinine, Ser 1.05 0.61 - 1.24 mg/dL   Calcium 9.5 8.9 - 10.3 mg/dL   Total Protein 7.6 6.5 - 8.1 g/dL   Albumin 4.6 3.5 - 5.0 g/dL   AST 32 15 - 41 U/L   ALT 25 17 - 63 U/L   Alkaline Phosphatase 59 38 - 126 U/L   Total Bilirubin 0.5 0.3 - 1.2 mg/dL   GFR calc non Af Amer >60 >60 mL/min   GFR calc Af Amer >60 >60 mL/min    Comment: (NOTE) The eGFR has been calculated using the CKD EPI equation. This calculation has not been validated in all clinical situations. eGFR's persistently <60 mL/min signify possible Chronic Kidney Disease.  Anion gap 6 5 - 15  Urine rapid drug screen (hosp performed)     Status: Abnormal   Collection Time: 05/12/16  5:40 PM  Result Value Ref Range   Opiates NONE DETECTED NONE DETECTED   Cocaine NONE DETECTED NONE DETECTED   Benzodiazepines NONE DETECTED NONE DETECTED   Amphetamines NONE DETECTED NONE DETECTED   Tetrahydrocannabinol POSITIVE (A) NONE DETECTED   Barbiturates NONE DETECTED NONE DETECTED    Comment:        DRUG SCREEN FOR MEDICAL PURPOSES ONLY.  IF CONFIRMATION IS NEEDED FOR ANY PURPOSE, NOTIFY LAB WITHIN 5 DAYS.        LOWEST DETECTABLE LIMITS FOR URINE DRUG SCREEN Drug Class       Cutoff (ng/mL) Amphetamine      1000 Barbiturate      200 Benzodiazepine   656 Tricyclics       812 Opiates          300 Cocaine          300 THC              50   Ethanol     Status: None   Collection Time: 05/12/16  5:40 PM  Result Value Ref Range   Alcohol, Ethyl (B) <5 <5 mg/dL    Comment:        LOWEST DETECTABLE LIMIT FOR SERUM ALCOHOL IS 5 mg/dL FOR MEDICAL PURPOSES ONLY   CBC with Differential      Status: None   Collection Time: 05/12/16  5:40 PM  Result Value Ref Range   WBC 7.5 4.0 - 10.5 K/uL   RBC 4.96 4.22 - 5.81 MIL/uL   Hemoglobin 15.0 13.0 - 17.0 g/dL   HCT 44.2 39.0 - 52.0 %   MCV 89.1 78.0 - 100.0 fL   MCH 30.2 26.0 - 34.0 pg   MCHC 33.9 30.0 - 36.0 g/dL   RDW 13.3 11.5 - 15.5 %   Platelets 177 150 - 400 K/uL   Neutrophils Relative % 67 %   Neutro Abs 5.0 1.7 - 7.7 K/uL   Lymphocytes Relative 21 %   Lymphs Abs 1.6 0.7 - 4.0 K/uL   Monocytes Relative 8 %   Monocytes Absolute 0.6 0.1 - 1.0 K/uL   Eosinophils Relative 4 %   Eosinophils Absolute 0.3 0.0 - 0.7 K/uL   Basophils Relative 0 %   Basophils Absolute 0.0 0.0 - 0.1 K/uL    Blood Alcohol level:  Lab Results  Component Value Date   ETH <5 05/12/2016   ETH <11 75/17/0017    Metabolic Disorder Labs:  No results found for: HGBA1C, MPG No results found for: PROLACTIN No results found for: CHOL, TRIG, HDL, CHOLHDL, VLDL, LDLCALC  Current Medications: Current Facility-Administered Medications  Medication Dose Route Frequency Provider Last Rate Last Dose  . acetaminophen (TYLENOL) tablet 650 mg  650 mg Oral Q4H PRN Patrecia Pour, NP      . alum & mag hydroxide-simeth (MAALOX/MYLANTA) 200-200-20 MG/5ML suspension 30 mL  30 mL Oral Q4H PRN Patrecia Pour, NP      . magnesium hydroxide (MILK OF MAGNESIA) suspension 30 mL  30 mL Oral Daily PRN Patrecia Pour, NP      . nicotine polacrilex (NICORETTE) gum 2 mg  2 mg Oral PRN Jenne Campus, MD      . Oxcarbazepine (TRILEPTAL) tablet 300 mg  300 mg Oral BID Patrecia Pour, NP   300 mg at 05/14/16 4944  .  traZODone (DESYREL) tablet 50 mg  50 mg Oral QHS Patrecia Pour, NP   50 mg at 05/13/16 2122   PTA Medications: Prescriptions Prior to Admission  Medication Sig Dispense Refill Last Dose  . EPINEPHrine (EPIPEN JR) 0.15 MG/0.3ML injection Inject 0.15 mg into the muscle as needed for anaphylaxis.    unknown    Musculoskeletal: Strength & Muscle Tone: within  normal limits Gait & Station: normal Patient leans: N/A  Psychiatric Specialty Exam: Physical Exam  ROS  Blood pressure 128/71, pulse 74, temperature 98.1 F (36.7 C), temperature source Oral, resp. rate 16, height 6' (1.829 m), weight 80.7 kg (178 lb).Body mass index is 24.14 kg/m.  General Appearance: Casual  Eye Contact:  Good  Speech:  Clear and Coherent and Normal Rate  Volume:  Normal  Mood:  Depressed  Affect:  Depressed  Thought Process:  Coherent and Goal Directed  Orientation:  Full (Time, Place, and Person)  Thought Content:  Logical  Suicidal Thoughts:  No  Homicidal Thoughts:  No  Memory:  Immediate;   Fair Recent;   Fair Remote;   Fair  Judgement:  Fair  Insight:  Fair  Psychomotor Activity:  Normal  Concentration:  Concentration: Fair and Attention Span: Fair  Recall:  [employer] of Knowledge:  Fair  Language:  Fair  Akathisia:  No  Handed:  Right  AIMS (if indicated):     Assets:  Communication Skills Desire for Improvement Housing Physical Health Social Support Transportation  ADL's:  Intact  Cognition:  WNL  Sleep:  Number of Hours: 6.5    Treatment Plan Summary: Daily contact with patient to assess and evaluate symptoms and progress in treatment, Medication management and Plan is for patient to continue trileptal but add an antidepressant since his predominant mood is depressed.  Observation Level/Precautions:  15 minute checks  Laboratory:  tsh  Psychotherapy:    Medications:    Consultations:    Discharge Concerns:    Estimated LOS:  Other:     Physician Treatment Plan for Primary Diagnosis: <principal problem not specified> Long Term Goal(s): Improvement in symptoms so as ready for discharge  Short Term Goals: Ability to identify changes in lifestyle to reduce recurrence of condition will improve, Ability to verbalize feelings will improve, Ability to disclose and discuss suicidal ideas, Ability to demonstrate self-control will  improve, Ability to identify and develop effective coping behaviors will improve, Ability to maintain clinical measurements within normal limits will improve, Compliance with prescribed medications will improve and Ability to identify triggers associated with substance abuse/mental health issues will improve  Physician Treatment Plan for Secondary Diagnosis: Active Problems:   Intermittent explosive disorder  Long Term Goal(s): Improvement in symptoms so as ready for discharge  Short Term Goals: Ability to identify changes in lifestyle to reduce recurrence of condition will improve, Ability to verbalize feelings will improve, Ability to disclose and discuss suicidal ideas, Ability to demonstrate self-control will improve, Ability to identify and develop effective coping behaviors will improve, Ability to maintain clinical measurements within normal limits will improve, Compliance with prescribed medications will improve and Ability to identify triggers associated with substance abuse/mental health issues will improve  I certify that inpatient services furnished can reasonably be expected to improve the patient's condition.    Ruffin Frederick, MD 9/25/20178:31 AM

## 2016-05-14 NOTE — [facility] Suicide Risk Assessment (Signed)
[facility]   Nursing information obtained from:    Demographic factors:    Current Mental Status:    Loss Factors:    Historical Factors:    Risk Reduction Factors:     Total Time spent with patient: 1 hour Principal Problem: <principal problem not specified> Diagnosis:   Patient Active Problem List   Diagnosis Date Noted  . Intermittent explosive disorder [F63.81] 05/13/2016  . Cannabis use disorder, severe, dependence ([facility]) [F12.20] 05/13/2016  . Substance induced mood disorder ([facility]) [F19.94] 05/13/2016  . Intoxication by drug [facility]) [Z61.096][F19.929] 07/12/2014  . Intoxication [R69] 07/12/2014  . Depression [F32.9] 07/12/2014  . Overdose of antitussive [T48.3X1A]    Subjective Data: See admission assessment  Continued Clinical Symptoms:  Alcohol Use Disorder Identification Test Final Score (AUDIT): 3 The "Alcohol Use Disorders Identification Test", Guidelines for Use in Primary Care, Second Edition.  World [employer] [facility] [facility](WHO). Score between 0-7:  no or low risk or alcohol related problems. Score between 8-15:  moderate risk of alcohol related problems. Score between 16-19:  high risk of alcohol related problems. Score 20 or above:  warrants further diagnostic evaluation for alcohol dependence and treatment.   CLINICAL FACTORS:   Depression:   Impulsivity   Musculoskeletal:See admission Assessment   Psychiatric Specialty Exam:See admission assessment Physical Exam  ROS  Blood pressure 128/71, pulse 74, temperature 98.1 F (36.7 C), temperature source Oral, resp. rate 16, height 6' (1.829 m), weight 80.7 kg (178 lb).Body mass index is 24.14 kg/m.      COGNITIVE FEATURES THAT CONTRIBUTE TO RISK:  None    SUICIDE RISK:   Moderate:  Frequent suicidal ideation with limited intensity, and duration, some specificity in terms of plans, no associated intent, good self-control, limited dysphoria/symptomatology, some risk factors present, and  identifiable protective factors, including available and accessible social support.   PLAN OF CARE: See admission assessment  I certify that inpatient services furnished can reasonably be expected to improve the patient's condition.  Wynelle BourgeoisUreh N Lekauwa, MD 05/14/2016, 12:53 PM

## 2016-05-14 NOTE — Tx Team (Signed)
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 acetaminophen (TYLENOL) tablet 650 mg  650 mg Oral Q4H PRN Patrecia Pour, NP      . alum & mag hydroxide-simeth (MAALOX/MYLANTA) 200-200-20 MG/5ML suspension 30 mL  30 mL Oral Q4H PRN Patrecia Pour, NP      . escitalopram (LEXAPRO) tablet 10 mg  10 mg Oral Daily Sueanne Margarita, MD   10 mg at 05/14/16 1208  . magnesium hydroxide (MILK OF MAGNESIA) suspension 30 mL  30 mL Oral Daily PRN Patrecia Pour, NP      . nicotine polacrilex (NICORETTE) gum 2 mg  2 mg Oral PRN Jenne Campus, MD      . Oxcarbazepine (TRILEPTAL) tablet 300 mg  300 mg Oral BID Patrecia Pour, NP   300 mg at 05/14/16 0223  . traZODone (DESYREL) tablet 50 mg  50 mg Oral QHS Patrecia Pour, NP   50 mg at 05/13/16 2122    PTA Medications: Prescriptions Prior to Admission  Medication Sig Dispense Refill Last Dose  . EPINEPHrine (EPIPEN JR) 0.15 MG/0.3ML injection Inject 0.15 mg into the muscle as needed for anaphylaxis.    unknown    Treatment Modalities: Medication Management, Group therapy, Case management,  1 to 1 session with clinician, Psychoeducation, Recreational therapy.   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, moderate Long Term Goal(s): Improvement in symptoms so as ready for discharge  Short Term Goals: Ability to identify changes in lifestyle to reduce recurrence of condition will improve, Ability to verbalize feelings will improve, Ability to disclose and discuss suicidal ideas, Ability to demonstrate self-control will improve, Ability to identify and  develop effective coping behaviors will improve, Ability to maintain clinical measurements within normal limits will improve, Compliance with prescribed medications will improve and Ability to identify triggers associated with substance abuse/mental health issues will improve  Medication Management: Evaluate patient's response, side effects, and tolerance of medication regimen.  Therapeutic Interventions: 1 to 1 sessions, Unit Group sessions and Medication administration.  Evaluation of Outcomes: Not Met  Physician Treatment Plan for Secondary Diagnosis: Active Problems:   Intermittent explosive disorder   Long Term Goal(s): Improvement in symptoms so as ready for discharge  Short Term Goals: Ability to identify changes in lifestyle to reduce recurrence of condition will improve, Ability to verbalize feelings will improve, Ability to disclose and discuss suicidal ideas, Ability to demonstrate self-control will improve, Ability to identify and develop effective coping behaviors will improve, Ability to maintain clinical measurements within normal limits will improve, Compliance with prescribed medications will improve and Ability to identify triggers associated with substance abuse/mental health issues will improve  Medication Management: Evaluate patient's response, side effects, and tolerance of medication regimen.  Therapeutic Interventions: 1 to 1 sessions, Unit Group sessions and Medication administration.  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 RN will report any adverse and/or side effects to prescribing provider.  Therapeutic Interventions: 1 on 1 counseling sessions, Psychoeducation, Medication administration, Evaluate responses to treatment, Monitor vital signs and CBGs as ordered, Perform/monitor CIWA, COWS, AIMS and Fall Risk screenings as ordered, Perform wound care treatments as ordered.  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  Short Term Goals: Engage patient in aftercare planning with referrals and resources, Increase emotional regulation, Identify triggers associated with mental health/substance abuse issues and Increase skills for wellness and recovery  Therapeutic Interventions: Assess for all discharge needs, 1 to 1 time with Social worker, Explore available resources and support systems, Assess for adequacy in community support network, Educate family and significant other(s) on suicide prevention, Complete Psychosocial Assessment, Interpersonal group therapy.  Evaluation of Outcomes: Not Met   Progress in Treatment: Attending groups: Pt is new to milieu, continuing to assess  Participating in groups: Pt is new to milieu, continuing to assess  Taking medication as prescribed: Yes, MD continues to assess for medication changes as needed Toleration medication: Yes, no side effects reported at this time Family/Significant other contact made: No, CSW assessing for appropriate contact Patient understands diagnosis: Continuing to assess Discussing patient identified problems/goals with staff: Yes Medical problems stabilized or resolved: Yes Denies suicidal/homicidal ideation: Yes Issues/concerns  per patient self-inventory: None Other: N/A  New problem(s) identified: None identified at this time.   New Short Term/Long Term Goal(s): None identified at this time.   Discharge Plan or Barriers: Pt will return home and follow-up with outpatient services.   Reason for Continuation of Hospitalization: Anxiety Depression Medication stabilization Suicidal ideation  Estimated Length of Stay: 3-5 days  Attendees: Patient: 05/14/2016  4:31 PM  Physician: Dr. Ananias Pilgrim 05/14/2016  4:31 PM  Nursing: Loletta Specter 05/14/2016  4:31 PM  RN Care Manager: Lars Pinks, RN 05/14/2016  4:31 PM  Social Worker: Peri Maris, LCSW; Erasmo Downer Drinkard, LCSW 05/14/2016  4:31 PM  Recreational Therapist:  05/14/2016  4:31 PM  Other: Lindell Spar, NP; Samuel Jester, NP 05/14/2016  4:31 PM  Other:  05/14/2016  4:31 PM  Other: 05/14/2016  4:31 PM    Scribe for Treatment Team: Bo Mcclintock, LCSW 05/14/2016 4:31 PM

## 2016-05-14 NOTE — Plan of Care (Signed)
Problem: Medication: Goal: Compliance with prescribed medication regimen will improve Outcome: Progressing Pt has been compliant with medications tonight.    

## 2016-05-14 NOTE — Progress Notes (Signed)
Adult Psychoeducational Group Note  Date:  05/13/2016 Time:  2145  Group Topic/Focus:  Wrap-Up Group:   The focus of this group is to help patients review their daily goal of treatment and discuss progress on daily workbooks.   Participation Level:  Active  Participation Quality:  Appropriate  Affect:  Appropriate  Cognitive:  Alert, Appropriate and Oriented  Insight: Appropriate  Engagement in Group:  Engaged  Modes of Intervention:  Discussion  Additional Comments:  Patient attended group and said her day was a 7.  He did not any goal for the day.  His coping skills are drawing, mediation and socializing.  Tylen Leverich W Reygan Heagle 05/14/2016, 2:00 AM

## 2016-05-14 NOTE — Progress Notes (Signed)
D: Patient up and visible in the milieu. Spoke with patient 1:1. Rates sleep good, appetite fair, energy normal and concentration good. Patient's affect anxious, mood depressed. Rating his depression at a 5/10, hopelessness at a 5/10 and anxiety at a 6/10. States goal for today is to "control anger." Denies pain, physical problems.   A: Medicated per orders, no prns requested or needed. Emotional support offered and self inventory reviewed.  R: Patient denies SI/HI and remains safe on level III obs. Behavior appropriate, no outbursts or episodes of anger.

## 2016-05-14 NOTE — Progress Notes (Signed)
D: Pt was in the day room upon initial approach.  Pt is apprehensive with depressed, anxious mood.  Pt denies SI/HI, denies hallucinations, denies pain.  Pt's goal is to work on "anger" and to "adjust to the unit."  Pt has been visible in milieu interacting with peers and staff appropriately.  Pt attended evening group.   A: Introduced self to pt.  Actively listened to pt and offered support and encouragement. Medications administered per order.   R: Pt is compliant with medications.  Pt verbally contracts for safety.  Will continue to monitor and assess.

## 2016-05-15 NOTE — Progress Notes (Signed)
[facility] MD Progress Note  05/15/2016 1:33 PM Lawrence [name]  MRN:  [id number] Subjective:  Met with treatment team this morning who reported that patient was still depressed though patient reports improving mood Denies any suicidal thought. Feel Grandmother is supportive. Patient is participating in groups. No medication side effects reported Principal Problem: <principal problem not specified> Diagnosis:   Patient Active Problem List   Diagnosis Date Noted  . Intermittent explosive disorder [F63.81] 05/13/2016  . Cannabis use disorder, severe, dependence (HCC) [F12.20] 05/13/2016  . Substance induced mood disorder (HCC) [F19.94] 05/13/2016  . Intoxication by drug (HCC) [F19.929] 07/12/2014  . Intoxication [R69] 07/12/2014  . Depression [F32.9] 07/12/2014  . Overdose of antitussive [T48.3X1A]    Total Time spent with patient: 30 minutes  Past Psychiatric History: see admission assessment  Past Medical History:  Past Medical History:  Diagnosis Date  . ADD (attention deficit disorder)   . Asthma   . Depressed    History reviewed. No pertinent surgical history. Family History: History reviewed. No pertinent family history. Family Psychiatric  History: See admission assessment Social History:  History  Alcohol Use No     History  Drug Use  . Types: Marijuana    Social History   Social History  . Marital status: Single    Spouse name: N/A  . Number of children: N/A  . Years of education: N/A   Social History Main Topics  . Smoking status: Current Some Day Smoker  . Smokeless tobacco: Never Used  . Alcohol use No  . Drug use:     Types: Marijuana  . Sexual activity: Not Asked   Other Topics Concern  . None   Social History Narrative  . None   Additional Social History:    Pain Medications: n/a History of alcohol / drug use?: Yes Longest period of sobriety (when/how long): drinks  few drinks a week Negative Consequences of Use: Legal Withdrawal Symptoms:  Agitation, Aggressive/Assaultive                    Sleep: Good  Appetite:  Good  Current Medications: Current Facility-Administered Medications  Medication Dose Route Frequency Provider Last Rate Last Dose  . acetaminophen (TYLENOL) tablet 650 mg  650 mg Oral Q4H PRN Jamison Y Lord, NP      . alum & mag hydroxide-simeth (MAALOX/MYLANTA) 200-200-20 MG/5ML suspension 30 mL  30 mL Oral Q4H PRN Jamison Y Lord, NP      . escitalopram (LEXAPRO) tablet 10 mg  10 mg Oral Daily  N , MD   10 mg at 05/15/16 0807  . magnesium hydroxide (MILK OF MAGNESIA) suspension 30 mL  30 mL Oral Daily PRN Jamison Y Lord, NP      . nicotine polacrilex (NICORETTE) gum 2 mg  2 mg Oral PRN Fernando A Cobos, MD      . Oxcarbazepine (TRILEPTAL) tablet 300 mg  300 mg Oral BID Jamison Y Lord, NP   300 mg at 05/15/16 0806  . traZODone (DESYREL) tablet 50 mg  50 mg Oral QHS Jamison Y Lord, NP   50 mg at 05/13/16 2122    Lab Results:  Results for orders placed or performed during the hospital encounter of 05/13/16 (from the past 48 hour(s))  TSH     Status: None   Collection Time: 05/14/16  6:24 PM  Result Value Ref Range   TSH 0.913 0.350 - 4.500 uIU/mL    Comment: Performed at [facility]      Blood Alcohol level:  Lab Results  Component Value Date   ETH <5 05/12/2016   ETH <11 63/14/9702    Metabolic Disorder Labs: No results found for: HGBA1C, MPG No results found for: PROLACTIN No results found for: CHOL, TRIG, HDL, CHOLHDL, VLDL, LDLCALC  Physical Findings: AIMS: Facial and Oral Movements Muscles of Facial Expression: None, normal Lips and Perioral Area: None, normal Jaw: None, normal Tongue: None, normal,Extremity Movements Upper (arms, wrists, hands, fingers): None, normal Lower (legs, knees, ankles, toes): None, normal, Trunk Movements Neck, shoulders, hips: None, normal, Overall Severity Severity of abnormal movements (highest score from questions above):  None, normal Incapacitation due to abnormal movements: None, normal Patient's awareness of abnormal movements (rate only patient's report): No Awareness, Dental Status Current problems with teeth and/or dentures?: No Does patient usually wear dentures?: No  CIWA:  CIWA-Ar Total: 0 COWS:  COWS Total Score: 0  Musculoskeletal: Strength & Muscle Tone: within normal limits Gait & Station: normal Patient leans: N/A  Psychiatric Specialty Exam: Physical Exam  ROS  Blood pressure 110/70, pulse 68, temperature 97.8 F (36.6 C), temperature source Oral, resp. rate 16, height 6' (1.829 m), weight 80.7 kg (178 lb).Body mass index is 24.14 kg/m.  General Appearance: Casual  Eye Contact:  Good  Speech:  Clear and Coherent and Normal Rate  Volume:  Normal  Mood:  Depressed  Affect:  Appropriate and Congruent  Thought Process:  Coherent and Goal Directed  Orientation:  Full (Time, Place, and Person)  Thought Content:  Logical  Suicidal Thoughts:  No  Homicidal Thoughts:  No  Memory:  Immediate;   Fair Recent;   Fair Remote;   Fair  Judgement:  Fair  Insight:  Fair  Psychomotor Activity:  Normal  Concentration:  Concentration: Fair and Attention Span: Fair  Recall:  [employer] of Knowledge:  Fair  Language:  Fair  Akathisia:  No  Handed:  Right  AIMS (if indicated):     Assets:  Communication Skills Desire for Improvement Housing Physical Health Social Support  ADL's:  Intact  Cognition:  WNL  Sleep:  Number of Hours: 6.5     Treatment Plan Summary: Daily contact with patient to assess and evaluate symptoms and progress in treatment, Medication management and Plan is for patient to continue to participate in milieu individual and group therapy  Ruffin Frederick, MD 05/15/2016, 1:33 PM

## 2016-05-15 NOTE — [facility] Group Notes (Signed)
[facility] LCSW Group Therapy 05/15/2016 1:15 PM  Type of Therapy: Group Therapy- Feelings about Diagnosis  Participation Level: Active   Participation Quality:  Bizarre, off-topic  Affect:  Incongruent at times  Cognitive: Alert and Oriented   Insight:  Limited  Engagement in Therapy: Developing/Improving and Engaged   Modes of Intervention: Clarification, Confrontation, Discussion, Education, Exploration, Limit-setting, Orientation, Problem-solving, Rapport Building, Dance movement psychotherapisteality Testing, Socialization and Support  Description of Group:   This group will allow patients to explore their thoughts and feelings about diagnoses they have received. Patients will be guided to explore their level of understanding and acceptance of these diagnoses. Facilitator will encourage patients to process their thoughts and feelings about the reactions of others to their diagnosis, and will guide patients in identifying ways to discuss their diagnosis with significant others in their lives. This group will be process-oriented, with patients participating in exploration of their own experiences as well as giving and receiving support and challenge from other group members.  Summary of Progress/Problems:  Pt participation was bizarre at times, describing that he feels "his brain is still trying to create it's own life and even if I only had 25% of a brain, it would still be trying to grow" and "if there isn't a pill that can be taken by 30 million people and interact the exact same way with no side effects and make everyone feel great, it shouldn't even be given to 30 people." Pt reports that he is "anti-medication" and thinks "natural" substances will not kill anyone.  Therapeutic Modalities:   Cognitive Behavioral Therapy Solution Focused Therapy Motivational Interviewing Relapse Prevention Therapy  Chad CordialLauren Carter, LCSWA 05/15/2016 3:52 PM
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
## 2016-05-15 NOTE — Progress Notes (Signed)
D: Pt denies any form of depression, anxiety, pain, SI, HI or AVH; he states, "I'm fine now because I have had the opportunity to think about my actions." Pt was observed in the dayroom interacting with peers A: Medications offered as prescribed.  Support, encouragement, and safe environment provided.  15-minute safety checks continue. R: Pt was med compliant.  Pt attended group. Safety checks continue.

## 2016-05-15 NOTE — [facility] Group Notes (Signed)
Pt attended spiritual care group on grief and loss facilitated by chaplain Burnis KingfisherMatthew Wilbur [place]   Group opened with brief discussion and psycho-social ed around grief and loss in relationships and in relation to self - identifying life patterns, circumstances, changes that cause losses. Established group norm of speaking from own life experience. Group goal of establishing open and affirming space for members to share loss and experience with grief, normalize grief experience and provide psycho social education and grief support.    [name] was present throughout group.  He was attentive to other group members and participated in discussion voluntarily.  He identified with another group member who described feeling isolated and was acknowledging how history of incarceration influenced his reluctance to trust others.  [name] stated he could not know what experience of incarceration was like, but he felt a similar feeling of isolation from others. [name] was supportive of other group members, though vague in his answers / contributions.  Spoke about relying on love and being hopeful that he could love himself and love others.    Belva CromeStalnaker, Katiana Ruland Wayne MDiv

## 2016-05-15 NOTE — [facility] Group Notes (Signed)
Adult Psychoeducational Group Note  Date:  05/15/2016 Time:  9:36 PM  Group Topic/Focus:  Wrap-Up Group:   The focus of this group is to help patients review their daily goal of treatment and discuss progress on daily workbooks.   Participation Level:  Active  Participation Quality:  Appropriate  Affect:  Appropriate  Cognitive:  Appropriate  Insight: Appropriate  Engagement in Group:  Engaged  Modes of Intervention:  Discussion  Additional Comments:  Pt stated that her goal was to learn how to forgive people. Pt stated that her day was a 10 out 10. Lawrence [name], [name] 05/15/2016, 9:36 PM

## 2016-05-15 NOTE — Progress Notes (Signed)
D: Pt visible in milieu, verbally engaged with peers this AM on initial approach. Pt presents with depressed affect and mood. Speech is logical, denies SI, HI, AVH and pain at this time. Verbally contracts for safety "yeah, I'll let you know".  A: Support and availability provided to pt. Encouraged to voice concerns. Scheduled medication administered as prescribed. Q 15 minutes checks maintained for safety on and off unit without incident or self harm gestures.  R: Pt attended scheduled groups. Compliant with medications. Denies concerns at this time. POC continues for mood stabilization and safety.

## 2016-05-15 NOTE — [facility] Group Notes (Signed)
[facility] Group Notes:  (Nursing/MHT/Case Management/Adjunct)  Date:  05/15/2016  Time:  0900 am  Type of Therapy:  Orientation Group  Participation Level:  Active  Participation Quality:  Appropriate  Affect:  Appropriate  Cognitive:  Appropriate  Insight:  Appropriate  Engagement in Group:  Supportive  Modes of Intervention:  Support  Summary of Progress/Problems: Patient states he is new to the unit.  He listened attentively and asked appropriate questions.  He was supportive of his peers.  Cranford MonBeaudry, Rembert Browe Evans 05/15/2016, 10:48 AM

## 2016-05-15 NOTE — Progress Notes (Signed)
D: Lawrence [name] states his goal today was all about "forgiveness". Forgiving himself as well as his mother for the argument they had prior to him being admitted. He was able to speak with his mother tonight on the telephone and he states "everything is ok now". Denies SI/HI/AVH at this time. Contracts for safety.  A: Encouragement and support given. Q15 minute room checks for patient safety.  R: Continue to monitor for patient safety and medication effectiveness.

## 2016-05-16 MED ORDER — OXCARBAZEPINE 300 MG PO TABS: 300.0000 mg | ORAL_TABLET | Freq: Two times a day (BID) | ORAL | 0 refills | Status: DC

## 2016-05-16 MED ORDER — ESCITALOPRAM OXALATE 10 MG PO TABS: 10.0000 mg | ORAL_TABLET | Freq: Every day | ORAL | 0 refills | Status: DC

## 2016-05-16 MED ORDER — RISPERIDONE 1 MG PO TABS: 1.0000 mg | ORAL_TABLET | Freq: Every day | ORAL | 0 refills | Status: DC

## 2016-05-16 MED ORDER — RISPERIDONE 1 MG PO TABS
1.0000 mg | ORAL_TABLET | Freq: Every day | ORAL | Status: DC
  Administered 2016-05-16: 1 mg via ORAL
  Filled 2016-05-16 (×2): qty 1
  Filled 2016-05-16: qty 7
  Filled 2016-05-16: qty 1

## 2016-05-16 MED ORDER — TRAZODONE HCL 50 MG PO TABS: 50.0000 mg | ORAL_TABLET | Freq: Every day | ORAL | 0 refills | Status: DC

## 2016-05-16 NOTE — Progress Notes (Signed)
DAR NOTE: Patient presents with anxious affect and depressed mood.  Denies pain, auditory and visual hallucinations.  Rates depression at 5, hopelessness at 6, and anxiety at 6.  Maintained on routine safety checks.  Medications given as prescribed.  Support and encouragement offered as needed. Patient observed socializing with peers in the dayroom.  Offered no complaint.

## 2016-05-16 NOTE — Progress Notes (Signed)
[facility] MD Progress Note  05/16/2016 10:19 AM Lawrence [name]  MRN:  [id number] Subjective:  Met with treatment team this morning who reported that patient has been difficulty communicating in group and processing information. When asked about this patient eas tangential and talked about anxiety instead. He is agreeable to giving risperdal a trial. He reports depressed and anxious mood but denies suicidal ideation and audio/visual hallucinations. Feel Grandmother is supportive. Patient is participating in groups. No medication side effects reported Principal Problem: <principal problem not specified> Diagnosis:   Patient Active Problem List   Diagnosis Date Noted  . Intermittent explosive disorder [F63.81] 05/13/2016  . Cannabis use disorder, severe, dependence ([place]) [F12.20] 05/13/2016  . Substance induced mood disorder ([place]) [F19.94] 05/13/2016  . Intoxication by drug [facility]) [C12.751] 07/12/2014  . Intoxication [R69] 07/12/2014  . Depression [F32.9] 07/12/2014  . Overdose of antitussive [T48.3X1A]    Total Time spent with patient: 30 minutes  Past Psychiatric History: see admission assessment  Past Medical History:  Past Medical History:  Diagnosis Date  . ADD (attention deficit disorder)   . Asthma   . Depressed    History reviewed. No pertinent surgical history. Family History: History reviewed. No pertinent family history. Family Psychiatric  History: See admission assessment Social History:  History  Alcohol Use No     History  Drug Use  . Types: Marijuana    Social History   Social History  . Marital status: Single    Spouse name: N/A  . Number of children: N/A  . Years of education: N/A   Social History Main Topics  . Smoking status: Current Some Day Smoker  . Smokeless tobacco: Never Used  . Alcohol use No  . Drug use:     Types: Marijuana  . Sexual activity: Not Asked   Other Topics Concern  . None   Social History Narrative  . None   Additional Social  History:    Pain Medications: n/a History of alcohol / drug use?: Yes Longest period of sobriety (when/how long): drinks  few drinks a week Negative Consequences of Use: Legal Withdrawal Symptoms: Agitation, Aggressive/Assaultive                    Sleep: Good  Appetite:  Good  Current Medications: Current Facility-Administered Medications  Medication Dose Route Frequency Provider Last Rate Last Dose  . acetaminophen (TYLENOL) tablet 650 mg  650 mg Oral Q4H PRN Patrecia Pour, NP      . alum & mag hydroxide-simeth (MAALOX/MYLANTA) 200-200-20 MG/5ML suspension 30 mL  30 mL Oral Q4H PRN Patrecia Pour, NP      . escitalopram (LEXAPRO) tablet 10 mg  10 mg Oral Daily Sueanne Margarita, MD   10 mg at 05/16/16 0749  . magnesium hydroxide (MILK OF MAGNESIA) suspension 30 mL  30 mL Oral Daily PRN Patrecia Pour, NP      . nicotine polacrilex (NICORETTE) gum 2 mg  2 mg Oral PRN Jenne Campus, MD      . Oxcarbazepine (TRILEPTAL) tablet 300 mg  300 mg Oral BID Patrecia Pour, NP   300 mg at 05/16/16 0749  . traZODone (DESYREL) tablet 50 mg  50 mg Oral QHS Patrecia Pour, NP   50 mg at 05/13/16 2122    Lab Results:  Results for orders placed or performed during the hospital encounter of 05/13/16 (from the past 48 hour(s))  TSH     Status: None  Collection Time: 05/14/16  6:24 PM  Result Value Ref Range   TSH 0.913 0.350 - 4.500 uIU/mL    Comment: Performed at [facility]    Blood Alcohol level:  Lab Results  Component Value Date   [facility] <5 05/12/2016   ETH <11 89/37/3428    Metabolic Disorder Labs: No results found for: HGBA1C, MPG No results found for: PROLACTIN No results found for: CHOL, TRIG, HDL, CHOLHDL, VLDL, LDLCALC  Physical Findings: AIMS: Facial and Oral Movements Muscles of Facial Expression: None, normal Lips and Perioral Area: None, normal Jaw: None, normal Tongue: None, normal,Extremity Movements Upper (arms, wrists, hands, fingers):  None, normal Lower (legs, knees, ankles, toes): None, normal, Trunk Movements Neck, shoulders, hips: None, normal, Overall Severity Severity of abnormal movements (highest score from questions above): None, normal Incapacitation due to abnormal movements: None, normal Patient's awareness of abnormal movements (rate only patient's report): No Awareness, Dental Status Current problems with teeth and/or dentures?: No Does patient usually wear dentures?: No  CIWA:  CIWA-Ar Total: 0 COWS:  COWS Total Score: 0  Musculoskeletal: Strength & Muscle Tone: within normal limits Gait & Station: normal Patient leans: N/A  Psychiatric Specialty Exam: Physical Exam  ROS  Blood pressure 126/66, pulse 79, temperature 98.6 F (37 C), temperature source Oral, resp. rate 18, height 6' (1.829 m), weight 80.7 kg (178 lb).Body mass index is 24.14 kg/m.  General Appearance: Casual  Eye Contact:  Good  Speech:  Patient is having difficulty processing information and is often tangential  Volume:  Normal  Mood:  Depressed  Affect:  Appropriate and Congruent  Thought Process:    Orientation:  Full (Time, Place, and Person)  Thought Content:  Logical  Suicidal Thoughts:  No  Homicidal Thoughts:  No  Memory:  Immediate;   Fair Recent;   Fair Remote;   Fair  Judgement:  Fair  Insight:  Fair  Psychomotor Activity:  Normal  Concentration:  Concentration: Fair and Attention Span: Fair  Recall:  [employer] of Knowledge:  Fair  Language:  Fair  Akathisia:  No  Handed:  Right  AIMS (if indicated):     Assets:  Communication Skills Desire for Improvement Housing Physical Health Social Support  ADL's:  Intact  Cognition:  WNL  Sleep:  Number of Hours: 6     Treatment Plan Summary: Daily contact with patient to assess and evaluate symptoms and progress in treatment, Medication management and Plan is for patient to continue to participate in milieu individual and group therapy Start rispedal '1mg'$  at  bedtime Ruffin Frederick, MD 05/16/2016, 10:19 AM

## 2016-05-16 NOTE — [facility] Group Notes (Signed)
[facility] LCSW Group Therapy 05/16/2016 1:15 PM  Type of Therapy: Group Therapy- Emotion Regulation  Participation Level: Active   Participation Quality:  Appropriate  Affect: Appropriate  Cognitive: Alert and Oriented   Insight:  Developing/Improving  Engagement in Therapy: Developing/Improving and Engaged   Modes of Intervention: Clarification, Confrontation, Discussion, Education, Exploration, Limit-setting, Orientation, Problem-solving, Rapport Building, Dance movement psychotherapisteality Testing, Socialization and Support  Summary of Progress/Problems: The topic for group today was emotional regulation. This group focused on both positive and negative emotion identification and allowed group members to process ways to identify feelings, regulate negative emotions, and find healthy ways to manage internal/external emotions. Group members were asked to reflect on a time when their reaction to an emotion led to a negative outcome and explored how alternative responses using emotion regulation would have benefited them. Group members were also asked to discuss a time when emotion regulation was utilized when a negative emotion was experienced. Pt was receptive to emotion regulation handout but continues to be hesitant to accept having a mental health condition.    Lawrence CordialLauren Carter, LCSWA 05/16/2016 3:32 PM

## 2016-05-16 NOTE — Progress Notes (Signed)
Per Dr. Seth Bake[place]ekwauwa, do not discharge pt today. Order entered in error. Writer notified Erskine SquibbJane, RN., that pt is not discharging today per MD.

## 2016-05-16 NOTE — [facility] Group Notes (Signed)
Pt attended group today. Pt stated his day went well he has worked on [employer]"Forgiveness" especially related to his mother. Pt expressed random things that did not relate to forgiveness and never expressed why it was related to his mother. Pt had a great day.

## 2016-05-16 NOTE — [facility] Suicide Risk Assessment (Signed)
[facility] INPATIENT:  Family/Significant Other Suicide Prevention Education  Suicide Prevention Education:  Education Completed; Lawrence Chessmananika Clark, Pt's mother [phone], has been identified by the patient as the family member/significant other with whom the patient will be residing, and identified as the person(s) who will aid the patient in the event of a mental health crisis (suicidal ideations/suicide attempt).  With written consent from the patient, the family member/significant other has been provided the following suicide prevention education, prior to the and/or following the discharge of the patient.  The suicide prevention education provided includes the following:  Suicide risk factors  Suicide prevention and interventions  National Suicide Hotline telephone number  [facility] assessment telephone number  [facility] Emergency Assistance 911  [facility] and/or [facility]idential Mobile [facility] telephone number  Request made of family/significant other to:  Remove weapons (e.g., guns, rifles, knives), all items previously/currently identified as safety concern.    Remove drugs/medications (over-the-counter, prescriptions, illicit drugs), all items previously/currently identified as a safety concern.  The family member/significant other verbalizes understanding of the suicide prevention education information provided.  The family member/significant other agrees to remove the items of safety concern listed above.  Elaina HoopsLauren M Carter 05/16/2016, 3:52 PM

## 2016-05-17 NOTE — Progress Notes (Signed)
Adult Psychoeducational Group Note  Date:  05/17/2016 Time:  0845 am  Group Topic/Focus:  Orientation:   The focus of this group is to educate the patient on the purpose and policies of crisis stabilization and provide a format to answer questions about their admission.  The group details unit policies and expectations of patients while admitted.   Participation Level:  Active  Participation Quality:  Appropriate  Affect:  Appropriate  Cognitive:  Appropriate  Insight: Appropriate  Engagement in Group:  Engaged  Modes of Intervention:  Discussion, Education and Orientation  Additional Comments:   Shyvonne Chastang L 05/17/2016, 4:08 PM

## 2016-05-17 NOTE — [facility] Group Notes (Signed)
[facility] Group Therapy 05/17/2016 1:15pm  Type of Therapy: Mental Health Association Presentation  Participation Level: Active  Participation Quality: Attentive  Affect: Appropriate  Cognitive: Oriented  Insight: Developing/Improving  Engagement in Therapy: Engaged  Modes of Intervention: Discussion, Education and Socialization  Summary of Progress/Problems: Mental Health Association (MHA) Speaker came to talk about his personal journey with substance abuse and addiction. The pt processed ways by which to relate to the speaker. MHA speaker provided handouts and educational information pertaining to groups and services offered by the [facility]. Pt was engaged in speaker's presentation and was receptive to resources provided.    Chad CordialLauren Carter, LCSWA 05/17/2016 2:08 PM

## 2016-05-17 NOTE — Progress Notes (Signed)
   D: Pt was in the dayroom interacting with his peers. Informed the writer that he may be up for discharge tomorrow. Stated, however that he may not want to go. Stated, " I may wait until Friday". Per pt he is concerned about his temper, and wants to make sure he's better before discharge. Stated, "I want to make sure it doesn't happen again. Im concerned for my family".  A:  Support and encouragement was offered. 15 min checks continued for safety.  R: Pt remains safe.

## 2016-05-17 NOTE — [facility] Group Notes (Signed)
Pt attended group and the subject was on lifestyle changes. Writer ask what is one thing you would change about yourself or your life? Pt stated to change my perception on life. Learning how to forgive myself and love myself.

## 2016-05-17 NOTE — Progress Notes (Signed)
D: Pt presents with flat affect and depressed mood. Pt reported feeling groggy this morning from taking bedtime medications. MD made aware. Pt reported improved mood and denies any agitation and anger episodes this morning. Pt rated depression 6/10. Anxiety 5/10. Pt compliant with taking meds and participating in groups.  A: Medications reviewed with pt. Medications administered as ordered per MD. Verbal support provided. Pt encouraged to attend groups. 15 minute checks performed for safety.  R: Pt receptive to tx.

## 2016-05-17 NOTE — Progress Notes (Signed)
[facility] MD Progress Note  05/17/2016 2:36 PM Lawrence [name]  MRN:  [id number] Subjective:  Met with treatment team this morning who had reported that patient has been difficulty communicating in group and processing information.Patient was statred on risperdal alst night and reports today that his thoughts are not racing as much and so able to process information better. He is attending groups   No medication side effects reported Principal Problem: <principal problem not specified> Diagnosis:   Patient Active Problem List   Diagnosis Date Noted  . Intermittent explosive disorder [F63.81] 05/13/2016  . Cannabis use disorder, severe, dependence ([place]) [F12.20] 05/13/2016  . Substance induced mood disorder ([place]) [F19.94] 05/13/2016  . Intoxication by drug [facility]) [W09.811] 07/12/2014  . Intoxication [R69] 07/12/2014  . Depression [F32.9] 07/12/2014  . Overdose of antitussive [T48.3X1A]    Total Time spent with patient: 30 minutes  Past Psychiatric History: see admission assessment  Past Medical History:  Past Medical History:  Diagnosis Date  . ADD (attention deficit disorder)   . Asthma   . Depressed    History reviewed. No pertinent surgical history. Family History: History reviewed. No pertinent family history. Family Psychiatric  History: See admission assessment Social History:  History  Alcohol Use No     History  Drug Use  . Types: Marijuana    Social History   Social History  . Marital status: Single    Spouse name: N/A  . Number of children: N/A  . Years of education: N/A   Social History Main Topics  . Smoking status: Current Some Day Smoker  . Smokeless tobacco: Never Used  . Alcohol use No  . Drug use:     Types: Marijuana  . Sexual activity: Not Asked   Other Topics Concern  . None   Social History Narrative  . None   Additional Social History:    Pain Medications: n/a History of alcohol / drug use?: Yes Longest period of sobriety (when/how long):  drinks  few drinks a week Negative Consequences of Use: Legal Withdrawal Symptoms: Agitation, Aggressive/Assaultive                    Sleep: Good  Appetite:  Good  Current Medications: Current Facility-Administered Medications  Medication Dose Route Frequency Provider Last Rate Last Dose  . acetaminophen (TYLENOL) tablet 650 mg  650 mg Oral Q4H PRN Patrecia Pour, NP      . alum & mag hydroxide-simeth (MAALOX/MYLANTA) 200-200-20 MG/5ML suspension 30 mL  30 mL Oral Q4H PRN Patrecia Pour, NP      . escitalopram (LEXAPRO) tablet 10 mg  10 mg Oral Daily Sueanne Margarita, MD   10 mg at 05/17/16 0824  . magnesium hydroxide (MILK OF MAGNESIA) suspension 30 mL  30 mL Oral Daily PRN Patrecia Pour, NP      . nicotine polacrilex (NICORETTE) gum 2 mg  2 mg Oral PRN Jenne Campus, MD      . Oxcarbazepine (TRILEPTAL) tablet 300 mg  300 mg Oral BID Patrecia Pour, NP   300 mg at 05/17/16 0824  . risperiDONE (RISPERDAL) tablet 1 mg  1 mg Oral QHS Sueanne Margarita, MD   1 mg at 05/16/16 2343  . traZODone (DESYREL) tablet 50 mg  50 mg Oral QHS Patrecia Pour, NP   50 mg at 05/16/16 2343    Lab Results:  No results found for this or any previous visit (from the past 49 hour(s)).  Blood Alcohol level:  Lab Results  Component Value Date   ETH <5 05/12/2016   ETH <11 16/05/9603    Metabolic Disorder Labs: No results found for: HGBA1C, MPG No results found for: PROLACTIN No results found for: CHOL, TRIG, HDL, CHOLHDL, VLDL, LDLCALC  Physical Findings: AIMS: Facial and Oral Movements Muscles of Facial Expression: None, normal Lips and Perioral Area: None, normal Jaw: None, normal Tongue: None, normal,Extremity Movements Upper (arms, wrists, hands, fingers): None, normal Lower (legs, knees, ankles, toes): None, normal, Trunk Movements Neck, shoulders, hips: None, normal, Overall Severity Severity of abnormal movements (highest score from questions above): None, normal Incapacitation  due to abnormal movements: None, normal Patient's awareness of abnormal movements (rate only patient's report): No Awareness, Dental Status Current problems with teeth and/or dentures?: No Does patient usually wear dentures?: No  CIWA:  CIWA-Ar Total: 0 COWS:  COWS Total Score: 0  Musculoskeletal: Strength & Muscle Tone: within normal limits Gait & Station: normal Patient leans: N/A  Psychiatric Specialty Exam: Physical Exam  ROS None reporte  Blood pressure (!) 102/57, pulse 80, temperature 97.8 F (36.6 C), temperature source Oral, resp. rate 18, height 6' (1.829 m), weight 80.7 kg (178 lb).Body mass index is 24.14 kg/m.  General Appearance: Casual  Eye Contact:  Good  Speech:  Patient is having difficulty processing information and is often tangential  Volume:  Normal  Mood:  Less depressed  Affect:  Appropriate and Congruent  Thought Process:  Less tangential  Orientation:  full  Thought Content:  Logical  Suicidal Thoughts:  No  Homicidal Thoughts:  No  Memory:  Immediate;   Fair Recent;   Fair Remote;   Fair  Judgement:  Fair  Insight:  Fair  Psychomotor Activity:  Normal  Concentration:  Concentration: Fair and Attention Span: Fair  Recall:  [employer] of Knowledge:  Fair  Language:  Fair  Akathisia:  No  Handed:  Right  AIMS (if indicated):     Assets:  Communication Skills Desire for Improvement Housing Physical Health Social Support  ADL's:  Intact  Cognition:  WNL  Sleep:  Number of Hours: 6.75     Treatment Plan Summary: Daily contact with patient to assess and evaluate symptoms and progress in treatment, Medication management and Plan is for patient to continue to participate in milieu individual and group therapy Continue rispedal 91m at bedtime URuffin Frederick MD 05/17/2016, 2:36 PM

## 2016-05-18 MED ORDER — RISPERIDONE 1 MG PO TABS: 1.0000 mg | ORAL_TABLET | Freq: Every day | ORAL | 0 refills | Status: DC

## 2016-05-18 MED ORDER — ESCITALOPRAM OXALATE 10 MG PO TABS: 10.0000 mg | ORAL_TABLET | Freq: Every day | ORAL | 0 refills | Status: DC

## 2016-05-18 MED ORDER — NICOTINE POLACRILEX 2 MG MT GUM: 2.0000 mg | CHEWING_GUM | OROMUCOSAL | 0 refills | Status: DC | PRN

## 2016-05-18 MED ORDER — TRAZODONE HCL 50 MG PO TABS: 50.0000 mg | ORAL_TABLET | Freq: Every day | ORAL | 0 refills | Status: DC

## 2016-05-18 MED ORDER — OXCARBAZEPINE 300 MG PO TABS: 300.0000 mg | ORAL_TABLET | Freq: Two times a day (BID) | ORAL | 0 refills | Status: DC

## 2016-05-18 NOTE — Progress Notes (Addendum)
D: Pt denies any form of depression, anxiety, pain, SI, HI or AVH; he states, "I'm doing okay; ready to go home." Pt was observed in the dayroom interacting with peers. Pt remained cal and cooperative. A: Medications offered as prescribed.  Support, encouragement, and safe environment provided.  15-minute safety checks continue. R: Pt refused 2200 medicaions.  Pt attended karaoke group. Safety checks continue.

## 2016-05-18 NOTE — Progress Notes (Signed)
D: patient is A&Ox4, denies SI/HI and A/VH. Patient stated he hated taking medications but will take them when he goes home, however, he stated he believes the meds are only temporary and he hopes to get off them soon. Patient stated he is going home with his mom to get his clothes and some belongings, but will be staying with his grandmother.  A: Patient took medications as scheduled. Continue monitoring patient 1:1.  R: Patient remains safe. Patient verbally agreed to let staff know if he no longer feels safe.  Elyza Whitt, Wyman SongsterAngela Marie, RN

## 2016-05-18 NOTE — Tx Team (Signed)
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 acetaminophen (TYLENOL) tablet 650 mg  650 mg Oral Q4H PRN Charm Rings, NP      . alum & mag hydroxide-simeth (MAALOX/MYLANTA) 200-200-20 MG/5ML suspension 30 mL  30 mL Oral Q4H PRN Charm Rings, NP      . escitalopram (LEXAPRO) tablet 10 mg  10 mg Oral Daily Molinda Bailiff, MD   10 mg at 05/18/16 0814  . magnesium hydroxide (MILK OF MAGNESIA) suspension 30 mL  30 mL Oral Daily PRN Charm Rings, NP      . nicotine polacrilex (NICORETTE) gum 2 mg  2 mg Oral PRN Craige Cotta, MD      . Oxcarbazepine (TRILEPTAL) tablet 300 mg  300 mg Oral BID Charm Rings, NP   300 mg at 05/18/16 0814  . risperiDONE (RISPERDAL) tablet 1 mg  1 mg Oral QHS Molinda Bailiff, MD   1 mg at 05/16/16 2343  . traZODone (DESYREL) tablet 50 mg  50 mg Oral QHS Charm Rings, NP   50 mg at 05/16/16 2343    PTA Medications: Prescriptions Prior to Admission  Medication Sig Dispense Refill Last Dose  . EPINEPHrine (EPIPEN JR) 0.15 MG/0.3ML injection Inject 0.15 mg into the muscle as needed for anaphylaxis.    unknown    Treatment Modalities: Medication Management, Group therapy, Case management,  1 to 1 session with clinician, Psychoeducation, Recreational therapy.   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, moderate Long Term Goal(s): Improvement in symptoms so as ready for discharge  Short Term Goals: Ability to identify changes in lifestyle to reduce recurrence of condition will improve, Ability to verbalize feelings will improve, Ability to disclose and  discuss suicidal ideas, Ability to demonstrate self-control will improve, Ability to identify and develop effective coping behaviors will improve, Ability to maintain clinical measurements within normal limits will improve, Compliance with prescribed medications will improve and Ability to identify triggers associated with substance abuse/mental health issues will improve  Medication Management: Evaluate patient's response, side effects, and tolerance of medication regimen.  Therapeutic Interventions: 1 to 1 sessions, Unit Group sessions and Medication administration.  Evaluation of Outcomes: Adequate for Discharge  Physician Treatment Plan for Secondary Diagnosis: Active Problems:   Intermittent explosive disorder   Long Term Goal(s): Improvement in symptoms so as ready for discharge  Short Term Goals: Ability to identify changes in lifestyle to reduce recurrence of condition will improve, Ability to verbalize feelings will improve, Ability to disclose and discuss suicidal ideas, Ability to demonstrate self-control will improve, Ability to identify and develop effective coping behaviors will improve, Ability to maintain clinical measurements within normal limits will improve, Compliance with prescribed medications will improve and Ability to identify triggers associated with substance abuse/mental health issues will improve  Medication Management: Evaluate patient's response, side effects, and tolerance of medication regimen.  Therapeutic Interventions: 1 to 1 sessions, Unit Group sessions and Medication administration.  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 RN will report any adverse and/or side effects to prescribing provider.  Therapeutic Interventions: 1 on 1 counseling sessions, Psychoeducation, Medication administration, Evaluate responses to treatment, Monitor vital signs and CBGs as ordered, Perform/monitor CIWA, COWS, AIMS and Fall Risk screenings as ordered, Perform wound care treatments as ordered.  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  Short Term Goals: Engage patient in aftercare planning with referrals and resources, Increase emotional regulation, Identify triggers associated with mental health/substance abuse issues and Increase skills for wellness and recovery  Therapeutic Interventions: Assess for all discharge needs, 1 to 1 time with Social worker, Explore available resources and support systems, Assess for adequacy in community support network, Educate family and significant other(s) on suicide prevention, Complete Psychosocial Assessment, Interpersonal group therapy.  Evaluation of Outcomes: Adequate for Discharge   Progress in Treatment: Attending groups: yes Participating in groups: Yes Taking medication as prescribed: Yes, MD continues to assess for medication changes as needed Toleration medication: Yes, no side effects reported at this time Family/Significant other contact made: Yes with mother Patient understands diagnosis: Continuing to assess Discussing patient identified problems/goals with staff: Yes Medical problems stabilized or resolved:  Yes Denies suicidal/homicidal ideation: Yes Issues/concerns per patient self-inventory: None Other: N/A  New problem(s) identified: None identified at this time.   New Short Term/Long Term Goal(s): None identified at this time.   Discharge Plan or Barriers: Pt will return home and follow-up with outpatient services.   Reason for Continuation of Hospitalization: Anxiety Depression Medication stabilization Suicidal ideation  Estimated Length of Stay: 0 days  Attendees: Patient: 05/18/2016  10:38 AM  Physician: Dr. Seth BakeLekwauwa 05/18/2016  10:38 AM  Nursing: Melissa NoonAngie Traxler, RN 05/18/2016  10:38 AM  RN Care Manager: Onnie BoerJennifer Clark, RN 05/18/2016  10:38 AM  Social Worker: Chad CordialLauren Carter, LCSW; Kristin Drinkard, LCSW 05/18/2016  10:38 AM  Recreational Therapist:  05/18/2016  10:38 AM  Other: Armandina StammerAgnes Nwoko, NP 05/18/2016  10:38 AM  Other:  05/18/2016  10:38 AM  Other: 05/18/2016  10:38 AM    Scribe for Treatment Team: Elaina HoopsLauren M Carter, LCSW 05/18/2016 10:38 AM

## 2016-05-18 NOTE — Progress Notes (Signed)
  [facility] :  Will you be returning to the same living situation after discharge:  Yes,  Pt returning home with family At discharge, do you have transportation home?: Yes,  mother to pick up Do you have the ability to pay for your medications: Yes,  Pt provided with samples and prescriptions  Release of information consent forms completed and in the chart;  Patient's signature needed at discharge.  Patient to Follow up at: Follow-up Information    [facility] .   Specialty:  Behavioral Health Why:  Please use the walk-in clinic within 1-3 days of discharge to be established for medication managment and therapy services. Clinic hours: Monday-Friday, 8am-3pm. Arrive early and inform staff that you are there for a hospital DC appointment Contact information: [address] [place] [address] [phone]           Next level of care provider has [facility] to [facility] Link:yes  Safety Planning and Suicide Prevention discussed: Yes,  with mother; see SPE note  Have you used any form of tobacco in the last 30 days? (Cigarettes, Smokeless Tobacco, Cigars, and/or Pipes): Yes  Has patient been referred to the Quitline?: Patient refused referral  Patient has been referred for addiction treatment: Yes  Britteny Fiebelkorn Lavonna RuaM Carter 05/18/2016, 10:39 AM

## 2016-05-18 NOTE — [facility] Suicide Risk Assessment (Signed)
[facility]   Principal Problem: <principal problem not specified> Discharge Diagnoses:  Patient Active Problem List   Diagnosis Date Noted  . Intermittent explosive disorder [F63.81] 05/13/2016  . Cannabis use disorder, severe, dependence (HCC) [F12.20] 05/13/2016  . Substance induced mood disorder (HCC) [F19.94] 05/13/2016  . Intoxication by drug [facility](HCC) [W09.811][F19.929] 07/12/2014  . Intoxication [R69] 07/12/2014  . Depression [F32.9] 07/12/2014  . Overdose of antitussive [T48.3X1A]     Total Time spent with patient: 30 minutes  Musculoskeletal: Strength & Muscle Tone: within normal limits Gait & Station: normal Patient leans: N/A  Psychiatric Specialty Exam: ROS  Blood pressure 132/69, pulse 78, temperature 97.8 F (36.6 C), temperature source Oral, resp. rate 18, height 6' (1.829 m), weight 80.7 kg (178 lb).Body mass index is 24.14 kg/m.  General Appearance: Casual  Eye Contact::  Good  Speech:  Clear and Coherent and Normal Rate409  Volume:  Normal  Mood:  Euthymic  Affect:  Congruent  Thought Process:  Coherent and Goal Directed  Orientation:  Full (Time, Place, and Person)  Thought Content:  Logical  Suicidal Thoughts:  No  Homicidal Thoughts:  No  Memory:  Immediate;   Fair Recent;   Fair Remote;   Fair  Judgement:  Fair  Insight:  Fair  Psychomotor Activity:  Normal  Concentration:  Fair  Recall:  Fis[employer]Fair  Fund of Knowledge:Fair  Language: Fair  Akathisia:  No  Handed:  Right  AIMS (if indicated):     Assets:  Communication Skills Desire for Improvement Financial Resources/Insurance Housing Physical Health Social Support  Sleep:  Number of Hours: 6.5  Cognition: WNL  ADL's:  Intact   Mental Status Per Nursing Assessment::   On Admission:     Demographic Factors:  Male and Adolescent or young adult  Loss Factors: Decrease in vocational status  Historical Factors: Family history of suicide  2 cousin Mental illness  Risk  Reduction Factors:   Religious beliefs about death, Living with another person, especially a relative, Positive social support and Positive therapeutic relationship  Continued Clinical Symptoms:  None  Cognitive Features That Contribute To Risk:  None    Suicide Risk:  Minimal: No identifiable suicidal ideation.  Patients presenting with no risk factors but with morbid ruminations; may be classified as minimal risk based on the severity of the depressive symptoms  [facility] Information    [facility]   Specialty:  Behavioral Health Why:  Please use the walk-in clinic within 1-3 days of discharge to be established for medication managment and therapy services. Clinic hours: Monday-Friday, 8am-3pm. Arrive early and inform staff that you are there for a hospital DC appointment Contact information: [address] [place] [address] [phone]           Plan Of Care/Follow-up recommendations:  Activity:  patient will follow up with all out[atient appointments and continue discharge medications  Wynelle BourgeoisUreh N Lekauwa, MD 05/18/2016, 12:07 PM

## 2016-05-18 NOTE — Progress Notes (Signed)
Patient verbalized understanding of discharge instructions and follow up. Patient filled out Suicide safety plan and copy given to patient for discharge. Suicide risk assessment, transitional care record, and AVS given to patient. All belongings will be returned to patient upon discharge. Patient will be discharged home with mom, then on to his grandmothers.  Lawrence [name], Lawrence SongsterAngela Marie, RN

## 2016-05-18 NOTE — Discharge Summary (Signed)
Physician Discharge Summary Note  Patient:  Lawrence [name] is an 20 y.o., male MRN:  [id number] DOB:  05/26/1996 Patient phone:  [phone] (home)  Patient address:   [address] [place] [address],   Total Time spent with patient: Greater than 30 minutes  Date of Admission:  05/13/2016  Date of Discharge: 05-18-16  Reason for Admission: Suicidal threates  Principal Problem: Intermittent Explosive disorder  Discharge Diagnoses: Patient Active Problem List   Diagnosis Date Noted  . Intermittent explosive disorder [F63.81] 05/13/2016  . Cannabis use disorder, severe, dependence (HCC) [F12.20] 05/13/2016  . Substance induced mood disorder (HCC) [F19.94] 05/13/2016  . Intoxication by drug [facility](HCC) [O13.086][F19.929] 07/12/2014  . Intoxication [R69] 07/12/2014  . Depression [F32.9] 07/12/2014  . Overdose of antitussive [T48.3X1A]    Past Psychiatric History: Intermittent Explosive disorder  Past Medical History:  Past Medical History:  Diagnosis Date  . ADD (attention deficit disorder)   . Asthma   . Depressed    History reviewed. No pertinent surgical history. Family History: History reviewed. No pertinent family history.  Family Psychiatric  History: See H&P  Social History:  History  Alcohol Use No     History  Drug Use  . Types: Marijuana    Social History   Social History  . Marital status: Single    Spouse name: N/A  . Number of children: N/A  . Years of education: N/A   Social History Main Topics  . Smoking status: Current Some Day Smoker  . Smokeless tobacco: Never Used  . Alcohol use No  . Drug use:     Types: Marijuana  . Sexual activity: Not Asked   Other Topics Concern  . None   Social History Narrative  . None   Hospital Course: Lawrence EkeCameron Harrisis an 20 y.o.male with past history of Polysubstance use disorder--alcohol,cocain and cannabis-, ADHD, Anxiety and depression. [facility] out by his mother. [facility] interviewed in the presence of his mother and he reported that he was brought to the hospital after arguments with his mother. Mother reported that patient verbalized suicidal thoughts the day before and stated that he wantedto die and be with Jesus. She also reported that  Patient had been getting increasingly agitated, hostile, physically aggressive towards her and had been punching holes in the wall along with throwing things, yelling and crying for no reasons. Patient now reports that he has been prescribed adderral  in the past but never took it. He has been self medicating with drugs though he is quick to admit that cannabis increases his paranoia.Patient reports prior history of cocaine, Xanax, alcohol abuse and current addiction to Marijuana.    While a patient in this hospital, Lawrence [name] received medication management for mood control. See medication list below. He was enrolled in the group counseling sessions being offered & held on this unit. He learned coping skills. His symptoms responded well to his treatment regimen. This is evidenced by his reports of improved mood & absence of suicidal ideations. Part of his discharge plans is a follow-up appointment & referral to an outpatient clinic for further psychiatric care & medication management. He was provided with all the necessary information needed to make this appointment without problems.   Upon discharge, Lawrence [name] both mentally & medically stable. He adamantly denies any SIHI, AVH, delusional thoughts or paranoia. [facility] in no apparent distress. Transportation per mother.  Physical Findings: AIMS:  Facial and Oral Movements Muscles of Facial Expression: None, normal Lips and Perioral Area: None, normal Jaw: None, normal Tongue: None, normal,Extremity Movements Upper (arms, wrists, hands, fingers): None, normal Lower (legs, knees, ankles, toes): None, normal, Trunk Movements Neck, shoulders, hips:  None, normal, Overall Severity Severity of abnormal movements (highest score from questions above): None, normal Incapacitation due to abnormal movements: None, normal Patient's awareness of abnormal movements (rate only patient's report): No Awareness, Dental Status Current problems with teeth and/or dentures?: No Does patient usually wear dentures?: No  CIWA:  CIWA-Ar Total: 0 COWS:  COWS Total Score: 0  Musculoskeletal: Strength & Muscle Tone: within normal limits Gait & Station: normal Patient leans: N/A  Psychiatric Specialty Exam: Physical Exam  Constitutional: He appears well-developed.  HENT:  Head: Normocephalic.  Eyes: Pupils are equal, round, and reactive to light.  Cardiovascular: Normal rate.   Respiratory: Effort normal.  GI: Soft.  Genitourinary:  Genitourinary Comments: Deferred  Musculoskeletal: Normal range of motion.  Neurological: He is alert.  Skin: Skin is warm.    Review of Systems  Constitutional: Negative.   HENT: Negative.   Eyes: Negative.   Respiratory: Negative.   Cardiovascular: Negative.   Gastrointestinal: Negative.   Genitourinary: Negative.   Musculoskeletal: Negative.   Skin: Negative.   Neurological: Negative.   Endo/Heme/Allergies: Negative.   Psychiatric/Behavioral: Positive for depression (Stable) and substance abuse (Hx. Cannabis use disorder). Negative for hallucinations, memory loss and suicidal ideas. The patient has insomnia (Stable). The patient is not nervous/anxious.     Blood pressure 132/69, pulse 78, temperature 97.8 F (36.6 C), temperature source Oral, resp. rate 18, height 6' (1.829 m), weight 80.7 kg (178 lb).Body mass index is 24.14 kg/m.  See Md's SRA   Have you used any form of tobacco in the last 30 days? (Cigarettes, Smokeless Tobacco, Cigars, and/or Pipes): Yes  Has this patient used any form of tobacco in the last 30 days? (Cigarettes, Smokeless Tobacco, Cigars, and/or Pipes): Yes, Nicorette gum prescription  provided.  Blood Alcohol level:  Lab Results  Component Value Date   ETH <5 05/12/2016   ETH <11 07/12/2014   Metabolic Disorder Labs:  No results found for: HGBA1C, MPG No results found for: PROLACTIN No results found for: CHOL, TRIG, HDL, CHOLHDL, VLDL, LDLCALC  See Psychiatric Specialty Exam and Suicide Risk Assessment completed by Attending Physician prior to discharge.  Discharge destination:  Home  Is patient on multiple antipsychotic therapies at discharge:  No   Has Patient had three or more failed trials of antipsychotic monotherapy by history:  No  Recommended Plan for Multiple Antipsychotic Therapies: NA  Discharge Instructions    Discharge patient    Complete by:  As directed        Medication List    TAKE these medications     Indication  EPINEPHrine 0.15 MG/0.3ML injection Commonly known as:  EPIPEN JR Inject 0.15 mg into the muscle as needed for anaphylaxis.  Indication:  Life-Threatening Allergic Reaction   escitalopram 10 MG tablet Commonly known as:  LEXAPRO Take 1 tablet (10 mg total) by mouth daily. For depression  Indication:  Major Depressive Disorder   nicotine polacrilex 2 MG gum Commonly known as:  NICORETTE Take 1 each (2 mg total) by mouth as needed for smoking cessation.  Indication:  Nicotine Addiction   Oxcarbazepine 300 MG tablet Commonly known as:  TRILEPTAL Take 1 tablet (300 mg total) by mouth 2 (two) times daily. For mood stabilization  Indication:  Manic-Depression, Mood stabilization   risperiDONE 1 MG tablet Commonly known as:  RISPERDAL Take 1 tablet (1 mg total) by mouth at bedtime. For mood control  Indication:  Mood control   traZODone 50 MG tablet Commonly known as:  DESYREL Take 1 tablet (50 mg total) by mouth at bedtime. For sleep  Indication:  Trouble Sleeping      Follow-up Information    [facility] .   Specialty:  Behavioral Health Why:  Please use the walk-in clinic within 1-3 days of discharge to be  established for medication managment and therapy services. Clinic hours: Monday-Friday, 8am-3pm. Arrive early and inform staff that you are there for a hospital DC appointment Contact information: [address] [place] [address] [phone]          Follow-up recommendations: Activity:  As tolerated Diet: As recommended by your primary care doctor. Keep all scheduled follow-up appointments as recommended.    Comments: Patient is instructed prior to discharge to: Take all medications as prescribed by his/her mental healthcare provider. Report any adverse effects and or reactions from the medicines to his/her outpatient provider promptly. Patient has been instructed & cautioned: To not engage in alcohol and or illegal drug use while on prescription medicines. In the event of worsening symptoms, patient is instructed to call the crisis hotline, 911 and or go to the nearest ED for appropriate evaluation and treatment of symptoms. To follow-up with his/her primary care provider for your other medical issues, concerns and or health care needs.   Signed: Sanjuana Kava, NP, PMHNP, FNP-BC 05/18/2016, 11:59 AM

## 2016-11-22 ENCOUNTER — Encounter (HOSPITAL_BASED_OUTPATIENT_CLINIC_OR_DEPARTMENT_OTHER): Payer: Self-pay | Admitting: *Deleted

## 2016-11-22 ENCOUNTER — Emergency Department (HOSPITAL_BASED_OUTPATIENT_CLINIC_OR_DEPARTMENT_OTHER): Payer: Self-pay

## 2016-11-22 ENCOUNTER — Emergency Department (HOSPITAL_BASED_OUTPATIENT_CLINIC_OR_DEPARTMENT_OTHER)
Admission: EM | Admit: 2016-11-22 | Discharge: 2016-11-22 | Disposition: A | Discharge status: Home / Self Care | Payer: Self-pay | Attending: Physician Assistant | Admitting: Physician Assistant

## 2016-11-22 DIAGNOSIS — F17200 Nicotine dependence, unspecified, uncomplicated: Secondary | ICD-10-CM | POA: Insufficient documentation

## 2016-11-22 DIAGNOSIS — Y9389 Activity, other specified: Secondary | ICD-10-CM | POA: Insufficient documentation

## 2016-11-22 DIAGNOSIS — H5789 Other specified disorders of eye and adnexa: Secondary | ICD-10-CM

## 2016-11-22 DIAGNOSIS — Y929 Unspecified place or not applicable: Secondary | ICD-10-CM | POA: Insufficient documentation

## 2016-11-22 DIAGNOSIS — J45909 Unspecified asthma, uncomplicated: Secondary | ICD-10-CM | POA: Insufficient documentation

## 2016-11-22 DIAGNOSIS — Z79899 Other long term (current) drug therapy: Secondary | ICD-10-CM | POA: Insufficient documentation

## 2016-11-22 DIAGNOSIS — Y999 Unspecified external cause status: Secondary | ICD-10-CM | POA: Insufficient documentation

## 2016-11-22 DIAGNOSIS — H578 Other specified disorders of eye and adnexa: Secondary | ICD-10-CM | POA: Insufficient documentation

## 2016-11-22 DIAGNOSIS — G9389 Other specified disorders of brain: Secondary | ICD-10-CM | POA: Insufficient documentation

## 2016-11-22 NOTE — ED Notes (Signed)
ED Provider at bedside. 
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
## 2016-11-22 NOTE — Discharge Instructions (Signed)
The CT scans of your maxillofacial and head did not show any acute injuries.  There is no orbital or skull bone fracture.    Take tylenol or ibuprofen for pain, as needed.  Follow up with ophthalmologist in 2-3 days for re-evaluation of your right eye redness.    Return to the ED if you notice double vision, blurred vision, severe eye pain with movements.

## 2017-01-08 ENCOUNTER — Emergency Department (HOSPITAL_COMMUNITY)
Admission: EM | Admit: 2017-01-08 | Discharge: 2017-01-09 | Disposition: A | Discharge status: Other Acute Inpatient Hospital | Payer: Medicaid Other | Attending: Psychiatry | Admitting: Psychiatry

## 2017-01-08 ENCOUNTER — Encounter (HOSPITAL_COMMUNITY): Payer: Self-pay | Admitting: *Deleted

## 2017-01-08 DIAGNOSIS — F250 Schizoaffective disorder, bipolar type: Secondary | ICD-10-CM | POA: Insufficient documentation

## 2017-01-08 DIAGNOSIS — F909 Attention-deficit hyperactivity disorder, unspecified type: Secondary | ICD-10-CM | POA: Insufficient documentation

## 2017-01-08 DIAGNOSIS — Z79899 Other long term (current) drug therapy: Secondary | ICD-10-CM | POA: Insufficient documentation

## 2017-01-08 DIAGNOSIS — F17200 Nicotine dependence, unspecified, uncomplicated: Secondary | ICD-10-CM | POA: Insufficient documentation

## 2017-01-08 DIAGNOSIS — F1220 Cannabis dependence, uncomplicated: Secondary | ICD-10-CM | POA: Diagnosis present

## 2017-01-08 DIAGNOSIS — J45909 Unspecified asthma, uncomplicated: Secondary | ICD-10-CM | POA: Insufficient documentation

## 2017-01-08 HISTORY — DX: Schizoaffective disorder, unspecified: F25.9

## 2017-01-08 LAB — SALICYLATE LEVEL

## 2017-01-08 LAB — ACETAMINOPHEN LEVEL

## 2017-01-08 LAB — CBC
HCT: 42.3 % (ref 39.0–52.0)
Hemoglobin: 14.6 g/dL (ref 13.0–17.0)
MCH: 31.3 pg (ref 26.0–34.0)
MCHC: 34.5 g/dL (ref 30.0–36.0)
MCV: 90.8 fL (ref 78.0–100.0)
PLATELETS: 185 10*3/uL (ref 150–400)
RBC: 4.66 MIL/uL (ref 4.22–5.81)
RDW: 13.1 % (ref 11.5–15.5)
WBC: 10.6 10*3/uL — AB (ref 4.0–10.5)

## 2017-01-08 LAB — COMPREHENSIVE METABOLIC PANEL
ALBUMIN: 4.6 g/dL (ref 3.5–5.0)
ALT: 31 U/L (ref 17–63)
ANION GAP: 8 (ref 5–15)
AST: 39 U/L (ref 15–41)
Alkaline Phosphatase: 50 U/L (ref 38–126)
BUN: 14 mg/dL (ref 6–20)
CO2: 27 mmol/L (ref 22–32)
Calcium: 9.3 mg/dL (ref 8.9–10.3)
Chloride: 105 mmol/L (ref 101–111)
Creatinine, Ser: 1.32 mg/dL — ABNORMAL HIGH (ref 0.61–1.24)
GFR calc Af Amer: >60 mL/min (ref >60)
GFR calc non Af Amer: >60 mL/min (ref >60)
GLUCOSE: 88 mg/dL (ref 65–99)
POTASSIUM: 4.2 mmol/L (ref 3.5–5.1)
SODIUM: 140 mmol/L (ref 135–145)
TOTAL PROTEIN: 6.6 g/dL (ref 6.5–8.1)
Total Bilirubin: 0.8 mg/dL (ref 0.3–1.2)

## 2017-01-08 LAB — ETHANOL

## 2017-01-08 MED ORDER — ESCITALOPRAM OXALATE 10 MG PO TABS
10.0000 mg | ORAL_TABLET | Freq: Every day | ORAL | Status: DC
  Administered 2017-01-09: 10 mg via ORAL
  Filled 2017-01-08: qty 1

## 2017-01-08 MED ORDER — OXCARBAZEPINE 300 MG PO TABS
300.0000 mg | ORAL_TABLET | Freq: Two times a day (BID) | ORAL | Status: DC
  Administered 2017-01-09 (×2): 300 mg via ORAL
  Filled 2017-01-08 (×2): qty 1

## 2017-01-08 MED ORDER — LORAZEPAM 1 MG PO TABS: 1.0000 mg | ORAL_TABLET | Freq: Three times a day (TID) | ORAL | Status: DC | PRN

## 2017-01-08 NOTE — ED Notes (Signed)
[facility] not [facility].
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
## 2017-01-08 NOTE — ED Triage Notes (Signed)
Pt reports SI for sometime now, no specific plan. Hx of depression and recent diagnosis schizoaffective. [facility], last saw them last Tuesday.

## 2017-01-08 NOTE — ED Notes (Addendum)
Pt stated "me and my mom got into an argument and I told her I wanted to kill myself.  I didn't have a plan.  I stay with my mom and we'll get into it and then I'll go to my grandmother's.  I don't work @ the present.  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  I haven't missed any of my medicines.  [facility].  I don't believe they've changed any medicines."  Pt denies having a plan.  Pt tearful during assessment.

## 2017-01-09 ENCOUNTER — Inpatient Hospital Stay (HOSPITAL_COMMUNITY)
Admission: EM | Admit: 2017-01-09 | Discharge: 2017-01-14 | DRG: 883 | Disposition: A | Discharge status: Home / Self Care | Payer: Medicaid Other | Source: Intra-hospital | Attending: Psychiatry | Admitting: Psychiatry

## 2017-01-09 ENCOUNTER — Encounter (HOSPITAL_COMMUNITY): Payer: Self-pay

## 2017-01-09 DIAGNOSIS — F1290 Cannabis use, unspecified, uncomplicated: Secondary | ICD-10-CM | POA: Diagnosis present

## 2017-01-09 DIAGNOSIS — Z79899 Other long term (current) drug therapy: Secondary | ICD-10-CM | POA: Diagnosis not present

## 2017-01-09 DIAGNOSIS — F419 Anxiety disorder, unspecified: Secondary | ICD-10-CM | POA: Diagnosis present

## 2017-01-09 DIAGNOSIS — F17290 Nicotine dependence, other tobacco product, uncomplicated: Secondary | ICD-10-CM | POA: Diagnosis present

## 2017-01-09 DIAGNOSIS — Z91018 Allergy to other foods: Secondary | ICD-10-CM | POA: Diagnosis not present

## 2017-01-09 DIAGNOSIS — F1994 Other psychoactive substance use, unspecified with psychoactive substance-induced mood disorder: Secondary | ICD-10-CM | POA: Diagnosis present

## 2017-01-09 DIAGNOSIS — R45851 Suicidal ideations: Secondary | ICD-10-CM | POA: Diagnosis present

## 2017-01-09 DIAGNOSIS — F250 Schizoaffective disorder, bipolar type: Secondary | ICD-10-CM | POA: Diagnosis present

## 2017-01-09 DIAGNOSIS — J45909 Unspecified asthma, uncomplicated: Secondary | ICD-10-CM | POA: Diagnosis present

## 2017-01-09 DIAGNOSIS — Z91030 Bee allergy status: Secondary | ICD-10-CM | POA: Diagnosis not present

## 2017-01-09 DIAGNOSIS — F6381 Intermittent explosive disorder: Principal | ICD-10-CM | POA: Diagnosis present

## 2017-01-09 DIAGNOSIS — G4700 Insomnia, unspecified: Secondary | ICD-10-CM | POA: Diagnosis present

## 2017-01-09 DIAGNOSIS — Z818 Family history of other mental and behavioral disorders: Secondary | ICD-10-CM

## 2017-01-09 DIAGNOSIS — Z91011 Allergy to milk products: Secondary | ICD-10-CM | POA: Diagnosis not present

## 2017-01-09 LAB — RAPID URINE DRUG SCREEN, HOSP PERFORMED
AMPHETAMINES: NOT DETECTED
BARBITURATES: NOT DETECTED
Benzodiazepines: NOT DETECTED
Cocaine: NOT DETECTED
Opiates: NOT DETECTED
Tetrahydrocannabinol: POSITIVE — AB

## 2017-01-09 MED ORDER — ALUM & MAG HYDROXIDE-SIMETH 200-200-20 MG/5ML PO SUSP: 30.0000 mL | ORAL | 0 refills | Status: DC | PRN

## 2017-01-09 MED ORDER — ESCITALOPRAM OXALATE 10 MG PO TABS
10.0000 mg | ORAL_TABLET | Freq: Every day | ORAL | Status: DC
  Administered 2017-01-10 – 2017-01-14 (×5): 10 mg via ORAL
  Filled 2017-01-09 (×4): qty 1
  Filled 2017-01-09: qty 7
  Filled 2017-01-09 (×4): qty 1

## 2017-01-09 MED ORDER — OXCARBAZEPINE 300 MG PO TABS
300.0000 mg | ORAL_TABLET | Freq: Two times a day (BID) | ORAL | Status: DC
  Administered 2017-01-09 – 2017-01-14 (×10): 300 mg via ORAL
  Filled 2017-01-09 (×4): qty 1
  Filled 2017-01-09: qty 14
  Filled 2017-01-09 (×8): qty 1
  Filled 2017-01-09: qty 14

## 2017-01-09 MED ORDER — MAGNESIUM HYDROXIDE 400 MG/5ML PO SUSP: 30.0000 mL | Freq: Every day | ORAL | 0 refills | Status: DC | PRN

## 2017-01-09 MED ORDER — MAGNESIUM HYDROXIDE 400 MG/5ML PO SUSP: 30.0000 mL | Freq: Every day | ORAL | Status: DC | PRN

## 2017-01-09 MED ORDER — ALUM & MAG HYDROXIDE-SIMETH 200-200-20 MG/5ML PO SUSP: 30.0000 mL | ORAL | Status: DC | PRN

## 2017-01-09 MED ORDER — MAGNESIUM HYDROXIDE 400 MG/5ML PO SUSP
30.0000 mL | Freq: Every day | ORAL | Status: DC | PRN
  Filled 2017-01-09: qty 30

## 2017-01-09 NOTE — [facility] Assessment (Signed)
[facility] Assessment Progress Note  Per Nira ConnJason Berry, NP, this pt requires psychiatric hospitalization at this time.  Clint Bolderori Beck, RN, [facility].  Pt has signed Voluntary Admission and Consent for Treatment, as well as Consent to Release Information to [facility], his outpatient provider, and a notification call has been placed.  [facility].  Pt's nurse, Lincoln MaxinOlivette, has been notified, and agrees to send original paperwork along with pt via [facility], and to call report to [phone].  Doylene Canninghomas Salome Hautala, MA Triage Specialist [phone]

## 2017-01-09 NOTE — [facility] Group Notes (Signed)
[facility] LCSW Group Therapy  01/09/2017 3:34 PM  Type of Therapy:  Group Therapy  Participation Level:  Active  Participation Quality:  Attentive  Affect:  Labile  Cognitive:  Lacking  Insight:  Limited  Engagement in Therapy:  Improving  Modes of Intervention:  Confrontation, Discussion, Education, Problem-solving, Socialization and Support  Summary of Progress/Problems: Self Sabotage: Patients were asked to explore the meaning of self sabotage and how this has affected their lives and recovery. Patients were given examples of self sabotage and encouraged to share personal examples of how this impacted their lives, what they learned from that experience, and explore ways to avoid these behaviors in the future. Lawrence [name] was attentive during group and engaged in discussion. He shared that "toxic relationships and living in as state of chaos" were his two means of self sabotage. "I feel like I'm totally exhausted mentally and physically, yet I am attracted to chaos in my life." Lawrence [name] made some bizarre/off topic statements during group but remained pleasant and redirectable. He continues to show some progress in the group setting with limited insight at this time.   Ledell PeoplesHeather N Smart LCSW 01/09/2017, 3:34 PM

## 2017-01-09 NOTE — ED Notes (Signed)
[facility] adult unit will be able to take patient after 8am today to room 301-1
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
## 2017-01-09 NOTE — Progress Notes (Signed)
Lawrence [name] is a 21 y.o. male voluntary [facility]  Pt stated that his relationship with his mother is not great, He stated his father is not in the picture, he has his mother and grandmother of whom he think take their frustrations on him. Pt also stated that he has been bullied in the past by friends and school mates. Pt stated he feels liking killing himself but he does not want to do it. Pt stated he is going to college in two months to study psychology. Pt calm and cooperative with the admission process, consents signed, skin/belongings search completed and pt oriented to unit. Pt stable at this time. Pt given the opportunity to express concerns and ask questions. Pt given toiletries. Will continue to monitor.

## 2017-01-09 NOTE — [facility] Suicide Risk Assessment (Signed)
[facility] Admission Suicide Risk Assessment   Nursing information obtained from:    Demographic factors:    Current Mental Status:    Loss Factors:    Historical Factors:    Risk Reduction Factors:     Total Time spent with patient: 30 minutes Principal Problem: <principal problem not specified> Diagnosis:   Patient Active Problem List   Diagnosis Date Noted  . Schizoaffective disorder, bipolar type (HCC) [F25.0] 01/09/2017  . Intermittent explosive disorder [F63.81] 05/13/2016  . Cannabis use disorder, severe, dependence (HCC) [F12.20] 05/13/2016  . Intoxication by drug [facility]) [Z61.096] 07/12/2014  . Intoxication [IMO0002] 07/12/2014  . Depression [F32.9] 07/12/2014  . Overdose of antitussive [T48.3X1A]    Subjective Data:  21 yo AAM, single, lives with his mom, unemployed. Presented for voluntary admission after an argument with his mom. Reports suicidal thoughts at presentation. Reports feeling depressed. Has not been socializing. Not in any relationship. Not currently employed and stays to self all the time. Daily use of THC. 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 Has not used any lately. UDS tested positive for THC. Transient visual and auditory hallucinations in the past. No homicidal thoughts. Linked to [employer]. Has has various diagnosis over the years. Recently diagnosed as Schizoaffective. Has had trials of antidepressants and recently Trileptal.  Significant family history mental illness. Significant family history of suicide. No personal history of suicidal attempt.  At interview, reports long history of substance use. Used to use cocaine, benzo's and pain pills. Past forensic history for breaking and entry. Pending charges for "being with the wrong crowd". Lives at his grandmothers sometimes. Was trying to transition back to his mom's place. Says his mom was trying to be controlling. Was upset during the argument and expressed suicidal thoughts. Says it was impulsive. No  precontemplation. Has no intention to harm self. Lost his job two months ago. Minimal social engagement since then. Not in any relationship. No kids. Reports negative ruminations about his life. Sometimes affects his ability to get into sleep. Thoughts does not jump from one topic to another. Transient auditory hallucinations and visual illusions in the past. Relates these to when high on street drugs. No current perceptual abnormality. No current thoughts of violence. No persecution. No other evidence of psychosis. No evidence of mania. Has agreed to recommence Trileptal.   Continued Clinical Symptoms:  Alcohol Use Disorder Identification Test Final Score (AUDIT): 0 The "Alcohol Use Disorders Identification Test", Guidelines for Use in Primary Care, Second Edition.  World Science writer [facility]). Score between 0-7:  no or low risk or alcohol related problems. Score between 8-15:  moderate risk of alcohol related problems. Score between 16-19:  high risk of alcohol related problems. Score 20 or above:  warrants further diagnostic evaluation for alcohol dependence and treatment.   CLINICAL FACTORS:  As above   Musculoskeletal: Strength & Muscle Tone: within normal limits Gait & Station: normal Patient leans: N/A  Psychiatric Specialty Exam: Physical Exam  Constitutional: He is oriented to person, place, and time. He appears well-developed and well-nourished.  HENT:  Head: Normocephalic and atraumatic.  Eyes: Conjunctivae are normal. Pupils are equal, round, and reactive to light.  Neck: Normal range of motion. Neck supple.  Cardiovascular: Normal rate and regular rhythm.   Respiratory: Effort normal and breath sounds normal.  GI: Soft. Bowel sounds are normal.  Musculoskeletal: Normal range of motion.  Neurological: He is alert and oriented to person, place, and time.  Skin: Skin is warm and dry.  Psychiatric:  As above    ROS  Blood pressure 118/74, pulse 63, temperature 98.6  F (37 C), temperature source Oral, resp. rate 18, height 6\' 1"  (1.854 m), weight 81.9 kg (180 lb 8 oz), SpO2 100 %.Body mass index is 23.81 kg/m.  General Appearance: Casually dressed, malodorous. Polite, calm and cooperative. Engages well. Not internally distracted. Appropriate behavior.   Eye Contact:  Good  Speech:  Clear and Coherent and Normal Rate  Volume:  Normal  Mood:  Euthymic  Affect:  Appropriate and Restricted  Thought Process:  Linear  Orientation:  Full (Time, Place, and Person)  Thought Content:  No delusional theme. No preoccupation with violent thoughts. No obsession.  No hallucination in any modality.   Suicidal Thoughts:  No  Homicidal Thoughts:  No  Memory:  Immediate;   Good Recent;   Good Remote;   Good  Judgement:  Fair  Insight:  Partial  Psychomotor Activity:  Normal  Concentration:  Concentration: Good and Attention Span: Good  Recall:  Good  Fund of Knowledge:  Good  Language:  Good  Akathisia:  No  Handed:    AIMS (if indicated):     Assets:  Communication Skills Desire for Improvement Housing Physical Health Resilience  ADL's:  Intact  Cognition:  WNL  Sleep:         COGNITIVE FEATURES THAT CONTRIBUTE TO RISK:  None    SUICIDE RISK:   Moderate:  Frequent suicidal ideation with limited intensity, and duration, some specificity in terms of plans, no associated intent, good self-control, limited dysphoria/symptomatology, some risk factors present, and identifiable protective factors, including available and accessible social support.  PLAN OF CARE:   Psychiatric: SUD Substance Induced Mood Disorder  Medical:  Psychosocial:  Unemployed  Limited social outlet  PLAN: 1. Continue Trileptal at home dose 2. Encourage unit groups and activities 3. Monitor mood, behavior and interaction with peers 4. Motivational enhancement  5. SW would gather collateral from his family   I certify that inpatient services furnished can reasonably  be expected to improve the patient's condition.   Georgiann CockerVincent A Izediuno, MD 01/09/2017, 2:32 PM

## 2017-01-09 NOTE — ED Notes (Signed)
Patient reports he got into a fight with his mother and that's when he was having the SI. Patient having some thought blocking and doesn't know what he is feeling right now. Patient was cooperative with body search. Pleasant but seems to not know how to explain himself at this time. Staff will continue to monitor for SI.

## 2017-01-09 NOTE — Tx Team (Signed)
Initial Treatment Plan 01/09/2017 12:20 PM Lawrence [name] [id number]    PATIENT STRESSORS: Educational concerns Occupational concerns Substance abuse   PATIENT STRENGTHS: Ability for insight Average or above average intelligence Communication skills Supportive family/friends   PATIENT IDENTIFIED PROBLEMS: anxiety  Substance abuse  "stop using drugs"  'Get my life back"               DISCHARGE CRITERIA:  Ability to meet basic life and health needs Adequate post-discharge living arrangements Improved stabilization in mood, thinking, and/or behavior Medical problems require only outpatient monitoring  PRELIMINARY DISCHARGE PLAN: Attend aftercare/continuing care group Attend PHP/IOP Attend 12-step recovery group Outpatient therapy  PATIENT/FAMILY INVOLVEMENT: This treatment plan has been presented to and reviewed with the patient, Lawrence [name], and/or family member.  The patient and family have been given the opportunity to ask questions and make suggestions.  Bethann PunchesJane O Rock Sobol, RN 01/09/2017, 12:20 PM

## 2017-01-09 NOTE — Plan of Care (Signed)
Problem: Safety: Goal: Periods of time without injury will increase Outcome: Progressing Pt. denies SI/HI/AVH at this time, remains a low fall risk, Q 15 checks in effect.    

## 2017-01-09 NOTE — H&P (Signed)
Psychiatric Admission Assessment Adult  Patient Identification: Lawrence [name] MRN:  [id number]  Date of Evaluation:  01/09/2017  Chief Complaint:  schizoaffective disorder  Principal Diagnosis: Schizoaffective disorder, bipolar-type.  Diagnosis:   Patient Active Problem List   Diagnosis Date Noted  . Schizoaffective disorder, bipolar type (HCC) [F25.0] 01/09/2017  . Intermittent explosive disorder [F63.81] 05/13/2016  . Cannabis use disorder, severe, dependence (HCC) [F12.20] 05/13/2016  . Intoxication by drug [facility]) [A59.034] 07/12/2014  . Intoxication [IMO0002] 07/12/2014  . Depression [F32.9] 07/12/2014  . Overdose of antitussive [T48.3X1A]    History of Present Illness: [name] is a 21 yo AAM, single, lives with his mom, unemployed. Presented for voluntary admission after an argument with his mom. Reports suicidal thoughts at presentation. Reports feeling depressed. Has not been socializing. Not in any relationship. Not currently employed and stays to self all the time. Daily use of THC. 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 Has not used any lately. UDS tested positive for THC. Transient visual and auditory hallucinations in the past. No homicidal thoughts. Linked to [employer]. Has has various diagnosis over the years. Recently diagnosed as Schizoaffective. Has had trials of antidepressants and recently Trileptal.  Significant family history mental illness. Significant family history of suicide. No personal history of suicidal attempt.  At interview, reports long history of substance use. Used to use cocaine, benzo's and pain pills. Past forensic history for breaking and entry. Pending charges for "being with the wrong crowd". Lives at his grandmothers sometimes. Was trying to transition back to his mom's place. Says his mom was trying to be controlling. Was upset during the argument and expressed suicidal thoughts. Says it was impulsive. No precontemplation. Has no intention to harm self.  Lost his job two months ago. Minimal social engagement since then. Not in any relationship. No kids. Reports negative ruminations about his life. Sometimes affects his ability to get into sleep. Thoughts does not jump from one topic to another. Transient auditory hallucinations and visual illusions in the past. Relates these to when high on street drugs. No current perceptual abnormality. No current thoughts of violence. No persecution. No other evidence of psychosis. No evidence of mania. Has agreed to recommence Trileptal.   Associated Signs/Symptoms:  Depression Symptoms:  depressed mood, anhedonia, insomnia, hopelessness, anxiety,  (Hypo) Manic Symptoms:  Irritable Mood, Labiality of Mood,  Anxiety Symptoms:  Excessive Worry, Social Anxiety,  Psychotic Symptoms:  Reports hx of visual & auditory hallucinations, but none at present.  PTSD Symptoms: Denies any PTSD symptoms or events.  Total Time spent with patient: 1 hour  Past Psychiatric History: Schizoaffective disorder.  Is the patient at risk to self? Yes.    Has the patient been a risk to self in the past 6 months? No.  Has the patient been a risk to self within the distant past? No.  Is the patient a risk to others? No.  Has the patient been a risk to others in the past 6 months? No.  Has the patient been a risk to others within the distant past? No.   Prior Inpatient Therapy:  Yes, [facility] remotely. Prior Outpatient Therapy: Yes, [facility].  Alcohol Screening: 1. How often do you have a drink containing alcohol?: Never 2. How many drinks containing alcohol do you have on a typical day when you are drinking?: 1 or 2 3. How often do you have six or more drinks on one occasion?: Never Preliminary Score: 0 4. How often during the last year have you found that  you were not able to stop drinking once you had started?: Never 5. How often during the last year have you failed to do what was normally expected from you becasue of  drinking?: Never 6. How often during the last year have you needed a first drink in the morning to get yourself going after a heavy drinking session?: Never 7. How often during the last year have you had a feeling of guilt of remorse after drinking?: Never 8. How often during the last year have you been unable to remember what happened the night before because you had been drinking?: Never 9. Have you or someone else been injured as a result of your drinking?: No 10. Has a relative or friend or a doctor or another health worker been concerned about your drinking or suggested you cut down?: No Alcohol Use Disorder Identification Test Final Score (AUDIT): 0 Brief Intervention: AUDIT score less than 7 or less-screening does not suggest unhealthy drinking-brief intervention not indicated  Substance Abuse History in the last 12 months:  Yes.    Consequences of Substance Abuse: Medical Consequences:  Liver damage, Possible death by overdose Legal Consequences:  Arrests, jail time, Loss of driving privilege. Family Consequences:  Family discord, divorce and or separation.   Previous Psychotropic Medications: Yes   Psychological Evaluations: No   Past Medical History:  Past Medical History:  Diagnosis Date  . ADD (attention deficit disorder)   . Asthma   . Depressed   . Schizoaffective disorder ([place])    History reviewed. No pertinent surgical history.  Family History: History reviewed. No pertinent family history.  Family Psychiatric  History: Major depression: Mother.  Tobacco Screening: Have you used any form of tobacco in the last 30 days? (Cigarettes, Smokeless Tobacco, Cigars, and/or Pipes): Yes Tobacco use, Select all that apply: cigar use daily Are you interested in Tobacco Cessation Medications?: No, patient refused Counseled patient on smoking cessation including recognizing danger situations, developing coping skills and basic information about quitting provided: Yes  Social  History:  History  Alcohol Use No     History  Drug Use  . Types: Marijuana    Additional Social History: Pain Medications: n/a Prescriptions: SEE MAR Over the Counter: Pt denies History of alcohol / drug use?: Yes Longest period of sobriety (when/how long): UNKNOWN Negative Consequences of Use: Work / School Withdrawal Symptoms: Agitation, Aggressive/Assaultive, Irritability Name of Substance 1: CANNABIS -  TESTED POSITVE TONIGHT IN ED 1 - Age of First Use: TEENS 1 - Amount (size/oz): VARIES 1 - Frequency:  DAILY WHEN HE CAN GET IT 1 - Duration: ONGOING 1 - Last Use / Amount: 01/08/17 Name of Substance 2: COCAINE - TESTED NEGATIVE IN ED TONIGHT 2 - Age of First Use: TEENS 2 - Amount (size/oz): VARIES 2 - Frequency: MAX 1 X MONTH 2 - Duration: ONGOING 2 - Last Use / Amount: "DONT REMEMBER" Name of Substance 3: NICOTINE/CIGARETTES 3 - Age of First Use: TEENS 3 - Amount (size/oz): 2-3 CIGARETTES 3 - Frequency: DAILY 3 - Duration: ONGOING 3 - Last Use / Amount: 01/08/17 Name of Substance 4: ALCOHOL - TESTED NEGATIVE IN ED 4 - Age of First Use: TEENS 4 - Amount (size/oz): UNK 4 - Frequency: UNK 4 - Duration: UNK 4 - Last Use / Amount: STOPPED OVER 1 YR AGO PER PT Name of Substance 5: XANAX-TESTED NEGATIVE IN ED 5 - Age of First Use: TEENS 5 - Amount (size/oz): UNK 5 - Frequency: UNK 5 - Duration: UNK  5 - Last Use / Amount: STOPPED OVER 1 YR AGO PER PT  Allergies:   Allergies  Allergen Reactions  . Bee Venom Anaphylaxis  . Apple   . Corn-Containing Products   . Lac Bovis Itching    Milk Related  . Other     Green Beans:   Other reaction(s): ASTHMA, RASH   Lab Results:  Results for orders placed or performed during the hospital encounter of 01/08/17 (from the past 48 hour(s))  Rapid urine drug screen (hospital performed)     Status: Abnormal   Collection Time: 01/08/17 10:30 PM  Result Value Ref Range   Opiates NONE DETECTED NONE DETECTED   Cocaine NONE  DETECTED NONE DETECTED   Benzodiazepines NONE DETECTED NONE DETECTED   Amphetamines NONE DETECTED NONE DETECTED   Tetrahydrocannabinol POSITIVE (A) NONE DETECTED   Barbiturates NONE DETECTED NONE DETECTED    Comment:        DRUG SCREEN FOR MEDICAL PURPOSES ONLY.  IF CONFIRMATION IS NEEDED FOR ANY PURPOSE, NOTIFY LAB WITHIN 5 DAYS.        LOWEST DETECTABLE LIMITS FOR URINE DRUG SCREEN Drug Class       Cutoff (ng/mL) Amphetamine      1000 Barbiturate      200 Benzodiazepine   546 Tricyclics       270 Opiates          300 Cocaine          300 THC              50   Comprehensive metabolic panel     Status: Abnormal   Collection Time: 01/08/17 11:05 PM  Result Value Ref Range   Sodium 140 135 - 145 mmol/L   Potassium 4.2 3.5 - 5.1 mmol/L   Chloride 105 101 - 111 mmol/L   CO2 27 22 - 32 mmol/L   Glucose, Bld 88 65 - 99 mg/dL   BUN 14 6 - 20 mg/dL   Creatinine, Ser 1.32 (H) 0.61 - 1.24 mg/dL   Calcium 9.3 8.9 - 10.3 mg/dL   Total Protein 6.6 6.5 - 8.1 g/dL   Albumin 4.6 3.5 - 5.0 g/dL   AST 39 15 - 41 U/L   ALT 31 17 - 63 U/L   Alkaline Phosphatase 50 38 - 126 U/L   Total Bilirubin 0.8 0.3 - 1.2 mg/dL   GFR calc non Af Amer >60 >60 mL/min   GFR calc Af Amer >60 >60 mL/min    Comment: (NOTE) The eGFR has been calculated using the CKD EPI equation. This calculation has not been validated in all clinical situations. eGFR's persistently <60 mL/min signify possible Chronic Kidney Disease.    Anion gap 8 5 - 15  Ethanol     Status: None   Collection Time: 01/08/17 11:05 PM  Result Value Ref Range   Alcohol, Ethyl (B) <5 <5 mg/dL    Comment:        LOWEST DETECTABLE LIMIT FOR SERUM ALCOHOL IS 5 mg/dL FOR MEDICAL PURPOSES ONLY   Salicylate level     Status: None   Collection Time: 01/08/17 11:05 PM  Result Value Ref Range   Salicylate Lvl <3.5 2.8 - 30.0 mg/dL  Acetaminophen level     Status: Abnormal   Collection Time: 01/08/17 11:05 PM  Result Value Ref Range    Acetaminophen (Tylenol), Serum <10 (L) 10 - 30 ug/mL    Comment:        THERAPEUTIC CONCENTRATIONS VARY  SIGNIFICANTLY. A RANGE OF 10-30 ug/mL MAY BE AN EFFECTIVE CONCENTRATION FOR MANY PATIENTS. HOWEVER, SOME ARE BEST TREATED AT CONCENTRATIONS OUTSIDE THIS RANGE. ACETAMINOPHEN CONCENTRATIONS >150 ug/mL AT 4 HOURS AFTER INGESTION AND >50 ug/mL AT 12 HOURS AFTER INGESTION ARE OFTEN ASSOCIATED WITH TOXIC REACTIONS.   cbc     Status: Abnormal   Collection Time: 01/08/17 11:05 PM  Result Value Ref Range   WBC 10.6 (H) 4.0 - 10.5 K/uL   RBC 4.66 4.22 - 5.81 MIL/uL   Hemoglobin 14.6 13.0 - 17.0 g/dL   HCT 42.3 39.0 - 52.0 %   MCV 90.8 78.0 - 100.0 fL   MCH 31.3 26.0 - 34.0 pg   MCHC 34.5 30.0 - 36.0 g/dL   RDW 13.1 11.5 - 15.5 %   Platelets 185 150 - 400 K/uL   Blood Alcohol level:  Lab Results  Component Value Date   ETH <5 01/08/2017   ETH <5 62/83/1517   Metabolic Disorder Labs:  No results found for: HGBA1C, MPG No results found for: PROLACTIN No results found for: CHOL, TRIG, HDL, CHOLHDL, VLDL, LDLCALC  Current Medications: Current Facility-Administered Medications  Medication Dose Route Frequency Provider Last Rate Last Dose  . alum & mag hydroxide-simeth (MAALOX/MYLANTA) 200-200-20 MG/5ML suspension 30 mL  30 mL Oral Q4H PRN Patrecia Pour, NP      . escitalopram (LEXAPRO) tablet 10 mg  10 mg Oral Daily Lord, Jamison Y, NP      . magnesium hydroxide (MILK OF MAGNESIA) suspension 30 mL  30 mL Oral Daily PRN Patrecia Pour, NP      . Oxcarbazepine (TRILEPTAL) tablet 300 mg  300 mg Oral BID Patrecia Pour, NP       PTA Medications: Prescriptions Prior to Admission  Medication Sig Dispense Refill Last Dose  . alum & mag hydroxide-simeth (MAALOX/MYLANTA) 200-200-20 MG/5ML suspension Take 30 mLs by mouth every 4 (four) hours as needed for indigestion. 355 mL 0   . EPINEPHrine (EPIPEN JR) 0.15 MG/0.3ML injection Inject 0.15 mg into the muscle as needed for  anaphylaxis.    not needed  . escitalopram (LEXAPRO) 10 MG tablet Take 1 tablet (10 mg total) by mouth daily. For depression 30 tablet 0 01/08/2017 at 0700  . magnesium hydroxide (MILK OF MAGNESIA) 400 MG/5ML suspension Take 30 mLs by mouth daily as needed for mild constipation. 360 mL 0   . Oxcarbazepine (TRILEPTAL) 300 MG tablet Take 1 tablet (300 mg total) by mouth 2 (two) times daily. For mood stabilization 60 tablet 0 01/08/2017 at 0700   Musculoskeletal: Strength & Muscle Tone: within normal limits Gait & Station: normal Patient leans: N/A  Psychiatric Specialty Exam: Physical Exam  Constitutional: He is oriented to person, place, and time. He appears well-developed.  HENT:  Head: Normocephalic.  Eyes: Pupils are equal, round, and reactive to light.  Neck: Normal range of motion.  Cardiovascular: Normal rate.   Respiratory: Effort normal.  GI: Soft.  Genitourinary:  Genitourinary Comments: Deferred  Musculoskeletal: Normal range of motion.  Neurological: He is alert and oriented to person, place, and time.  Skin: Skin is warm and dry.    Review of Systems  Constitutional: Negative.   HENT: Negative.   Eyes: Negative.   Respiratory: Negative.   Cardiovascular: Negative.   Gastrointestinal: Negative.   Genitourinary: Negative.   Musculoskeletal: Negative.   Skin: Negative.   Neurological: Negative.   Endo/Heme/Allergies: Negative.   Psychiatric/Behavioral: Positive for depression, substance abuse (UDS (+) for THC)  and suicidal ideas. Negative for memory loss. The patient is nervous/anxious and has insomnia.     Blood pressure 118/74, pulse 63, temperature 98.6 F (37 C), temperature source Oral, resp. rate 18, height '6\' 1"'$  (1.854 m), weight 81.9 kg (180 lb 8 oz), SpO2 100 %.Body mass index is 23.81 kg/m.  General Appearance: Casually dressed, malodorous. Polite, calm and cooperative. Engages well. Not internally distracted. Appropriate behavior.   Eye Contact:  Good   Speech:  Clear and Coherent and Normal Rate  Volume:  Normal  Mood:  Euthymic  Affect:  Appropriate and Restricted  Thought Process:  Linear  Orientation:  Full (Time, Place, and Person)  Thought Content:  No delusional theme. No preoccupation with violent thoughts. No obsession.  No hallucination in any modality.   Suicidal Thoughts:  No  Homicidal Thoughts:  No  Memory:  Immediate;   Good Recent;   Good Remote;   Good  Judgement:  Fair  Insight:  Partial  Psychomotor Activity:  Normal  Concentration:  Concentration: Good and Attention Span: Good  Recall:  Good  Fund of Knowledge:  Good  Language:  Good  Akathisia:  No  Handed:    AIMS (if indicated):     Assets:  Communication Skills Desire for Improvement Housing Physical Health Resilience  ADL's:  Intact  Cognition:  WNL  Sleep:        Treatment Plan/Recommendations: 1. Admit for crisis management and stabilization, estimated length of stay 3-5 days.  2. Medication management to reduce current symptoms to base line and improve the patient's overall level of functioning: (See Md's SRA & treatment plan).  3. Treat health problems as indicated.  4. Develop treatment plan to decrease risk of relapse upon discharge and the need for readmission.  5. Psycho-social education regarding relapse prevention and self care.  6. Health care follow up as needed for medical problems.  7. Review, reconcile, and reinstate any pertinent home medications for other health issues where appropriate. 8. Call for consults with hospitalist for any additional specialty patient care services as needed.  Observation Level/Precautions:  15 minute checks  Laboratory:  Per ED, UDS positive for [facility]  Psychotherapy: group sessions   Medications: See [facility]   Consultations: As needed   Discharge Concerns: Safety, mood stability.   Estimated LOS: 2-4 days  Other: Admit to the [facility].   Physician Treatment Plan for Primary Diagnosis: Will initiate  medication management for mood stability. Set up an outpatient psychiatric services for medication management. Will encourage medication adherence with psychiatric medications.  Long Term Goal(s): Improvement in symptoms so as ready for discharge  Short Term Goals: Ability to identify changes in lifestyle to reduce recurrence of condition will improve, Ability to verbalize feelings will improve, Ability to disclose and discuss suicidal ideas and Ability to demonstrate self-control will improve  Physician Treatment Plan for Secondary Diagnosis: Active Problems:   Schizoaffective disorder, bipolar type ([place])  Long Term Goal(s): Improvement in symptoms so as ready for discharge  Short Term Goals: Ability to identify and develop effective coping behaviors will improve, Compliance with prescribed medications will improve and Ability to identify triggers associated with substance abuse/mental health issues will improve  I certify that inpatient services furnished can reasonably be expected to improve the patient's condition.    Encarnacion Slates, NP, PMHNP, FNP-BC. 5/23/201812:47 PM

## 2017-01-10 MED ORDER — TRAZODONE HCL 50 MG PO TABS
50.0000 mg | ORAL_TABLET | Freq: Every evening | ORAL | Status: DC | PRN
  Administered 2017-01-10 – 2017-01-13 (×4): 50 mg via ORAL
  Filled 2017-01-10 (×11): qty 1

## 2017-01-10 MED ORDER — RISPERIDONE 1 MG PO TABS
1.0000 mg | ORAL_TABLET | Freq: Every day | ORAL | Status: DC
  Administered 2017-01-10 – 2017-01-13 (×4): 1 mg via ORAL
  Filled 2017-01-10 (×2): qty 1
  Filled 2017-01-10: qty 7
  Filled 2017-01-10 (×4): qty 1

## 2017-01-10 NOTE — Progress Notes (Signed)
[facility] MD Progress Note  01/10/2017 5:31 PM Lawrence [name]  MRN:  [id number] Subjective:   21 yo AAM, single, lives with his mom, unemployed. Presented for voluntary admission after an argument with his mom. Reports suicidal thoughts at presentation. Reports feeling depressed. Has not been socializing. Not in any relationship. Not currently employed and stays to self all the time. Daily use of THC. 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 Has not used any lately. UDS tested positive for THC.  Chart reviewed. Patient discussed at team  Staff notes that he was upset for no reason earlier today. He is described as being odd at group. He continues to voice visual distortions. Groomed self today. Has been socializing this evening. Taking his medications as prescribed.  Seen today. Says he woke up feeling weird. Says he has been ruminating about his life. Gets angry with self. Suicidal thoughts comes and goes. No intent to act. Says he would talk to staff if it becomes overbearing. Has the feelings of "a presence". Says it makes him feel as if some one is plotting against him. Not sure who is doing this. No passivity of will. No passivity of thought. No thoughts of violence towards others here.  We discussed use of Risperidone. He consented to treatment after we reviewed the risks and benefits.   Principal Problem: Substance induced mood disorder ([place]) Diagnosis:   Patient Active Problem List   Diagnosis Date Noted  . Schizoaffective disorder, bipolar type ([place]) [F25.0] 01/09/2017  . Intermittent explosive disorder [F63.81] 05/13/2016  . Cannabis use disorder, severe, dependence ([place]) [F12.20] 05/13/2016  . Substance induced mood disorder ([place]) [F19.94] 05/13/2016  . Intoxication by drug [facility]) [W09.811] 07/12/2014  . Intoxication [IMO0002] 07/12/2014  . Depression [F32.9] 07/12/2014  . Overdose of antitussive [T48.3X1A]    Total Time spent with patient: 20 minutes  Past Psychiatric History: As in  H&P  Past Medical History:  Past Medical History:  Diagnosis Date  . ADD (attention deficit disorder)   . Asthma   . Depressed   . Schizoaffective disorder ([place])    History reviewed. No pertinent surgical history. Family History: History reviewed. No pertinent family history. Family Psychiatric  History: As in H&P Social History:  History  Alcohol Use No     History  Drug Use  . Types: Marijuana    Social History   Social History  . Marital status: Single    Spouse name: N/A  . Number of children: N/A  . Years of education: N/A   Social History Main Topics  . Smoking status: Current Some Day Smoker    Types: Cigars  . Smokeless tobacco: Never Used  . Alcohol use No  . Drug use: Yes    Types: Marijuana  . Sexual activity: No   Other Topics Concern  . None   Social History Narrative  . 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 Symptoms: Agitation, Aggressive/Assaultive, Irritability Name of Substance 1: CANNABIS -  TESTED POSITVE TONIGHT IN ED 1 - Age of First Use: TEENS 1 - Amount (size/oz): VARIES 1 - Frequency:  DAILY WHEN HE CAN GET IT 1 - Duration: ONGOING 1 - Last Use / Amount: 01/08/17 Name of Substance 2: COCAINE - TESTED NEGATIVE IN ED TONIGHT 2 - Age of First Use: TEENS 2 -  Amount (size/oz): VARIES 2 - Frequency: MAX 1 X MONTH 2 - Duration: ONGOING 2 - Last Use / Amount: "DONT REMEMBER" Name of Substance 3: NICOTINE/CIGARETTES 3 - Age of First Use: TEENS 3 - Amount (size/oz): 2-3 CIGARETTES 3 - Frequency: DAILY 3 - Duration: ONGOING 3 - Last Use / Amount: 01/08/17 Name of Substance 4: ALCOHOL - TESTED NEGATIVE IN ED 4 - Age of First Use: TEENS 4 - Amount (size/oz): UNK 4 - Frequency: UNK 4 - Duration: UNK 4 - Last Use / Amount:  STOPPED OVER 1 YR AGO PER PT Name of Substance 5: XANAX-TESTED NEGATIVE IN ED 5 - Age of First Use: TEENS 5 - Amount (size/oz): UNK 5 - Frequency: UNK 5 - Duration: UNK 5 - Last Use / Amount: STOPPED OVER 1 YR AGO PER PT          Sleep: Good  Appetite:  Good  Current Medications: Current Facility-Administered Medications  Medication Dose Route Frequency Provider Last Rate Last Dose  . alum & mag hydroxide-simeth (MAALOX/MYLANTA) 200-200-20 MG/5ML suspension 30 mL  30 mL Oral Q4H PRN Patrecia Pour, NP      . escitalopram (LEXAPRO) tablet 10 mg  10 mg Oral Daily Patrecia Pour, NP   10 mg at 01/10/17 0856  . magnesium hydroxide (MILK OF MAGNESIA) suspension 30 mL  30 mL Oral Daily PRN Patrecia Pour, NP      . Oxcarbazepine (TRILEPTAL) tablet 300 mg  300 mg Oral BID Patrecia Pour, NP   300 mg at 01/10/17 1710    Lab Results:  Results for orders placed or performed during the hospital encounter of 01/08/17 (from the past 48 hour(s))  Rapid urine drug screen (hospital performed)     Status: Abnormal   Collection Time: 01/08/17 10:30 PM  Result Value Ref Range   Opiates NONE DETECTED NONE DETECTED   Cocaine NONE DETECTED NONE DETECTED   Benzodiazepines NONE DETECTED NONE DETECTED   Amphetamines NONE DETECTED NONE DETECTED   Tetrahydrocannabinol POSITIVE (A) NONE DETECTED   Barbiturates NONE DETECTED NONE DETECTED    Comment:        DRUG SCREEN FOR MEDICAL PURPOSES ONLY.  IF CONFIRMATION IS NEEDED FOR ANY PURPOSE, NOTIFY LAB WITHIN 5 DAYS.        LOWEST DETECTABLE LIMITS FOR URINE DRUG SCREEN Drug Class       Cutoff (ng/mL) Amphetamine      1000 Barbiturate      200 Benzodiazepine   073 Tricyclics       710 Opiates          300 Cocaine          300 THC              50   Comprehensive metabolic panel     Status: Abnormal   Collection Time: 01/08/17 11:05 PM  Result Value Ref Range   Sodium 140 135 - 145 mmol/L   Potassium 4.2 3.5 - 5.1 mmol/L   Chloride 105  101 - 111 mmol/L   CO2 27 22 - 32 mmol/L   Glucose, Bld 88 65 - 99 mg/dL   BUN 14 6 - 20 mg/dL   Creatinine, Ser 1.32 (H) 0.61 - 1.24 mg/dL   Calcium 9.3 8.9 - 10.3 mg/dL   Total Protein 6.6 6.5 - 8.1 g/dL   Albumin 4.6 3.5 - 5.0 g/dL   AST 39 15 - 41 U/L   ALT 31 17 - 63 U/L  Alkaline Phosphatase 50 38 - 126 U/L   Total Bilirubin 0.8 0.3 - 1.2 mg/dL   GFR calc non Af Amer >60 >60 mL/min   GFR calc Af Amer >60 >60 mL/min    Comment: (NOTE) The eGFR has been calculated using the CKD EPI equation. This calculation has not been validated in all clinical situations. eGFR's persistently <60 mL/min signify possible Chronic Kidney Disease.    Anion gap 8 5 - 15  Ethanol     Status: None   Collection Time: 01/08/17 11:05 PM  Result Value Ref Range   Alcohol, Ethyl (B) <5 <5 mg/dL    Comment:        LOWEST DETECTABLE LIMIT FOR SERUM ALCOHOL IS 5 mg/dL FOR MEDICAL PURPOSES ONLY   Salicylate level     Status: None   Collection Time: 01/08/17 11:05 PM  Result Value Ref Range   Salicylate Lvl <5.1 2.8 - 30.0 mg/dL  Acetaminophen level     Status: Abnormal   Collection Time: 01/08/17 11:05 PM  Result Value Ref Range   Acetaminophen (Tylenol), Serum <10 (L) 10 - 30 ug/mL    Comment:        THERAPEUTIC CONCENTRATIONS VARY SIGNIFICANTLY. A RANGE OF 10-30 ug/mL MAY BE AN EFFECTIVE CONCENTRATION FOR MANY PATIENTS. HOWEVER, SOME ARE BEST TREATED AT CONCENTRATIONS OUTSIDE THIS RANGE. ACETAMINOPHEN CONCENTRATIONS >150 ug/mL AT 4 HOURS AFTER INGESTION AND >50 ug/mL AT 12 HOURS AFTER INGESTION ARE OFTEN ASSOCIATED WITH TOXIC REACTIONS.   cbc     Status: Abnormal   Collection Time: 01/08/17 11:05 PM  Result Value Ref Range   WBC 10.6 (H) 4.0 - 10.5 K/uL   RBC 4.66 4.22 - 5.81 MIL/uL   Hemoglobin 14.6 13.0 - 17.0 g/dL   HCT 42.3 39.0 - 52.0 %   MCV 90.8 78.0 - 100.0 fL   MCH 31.3 26.0 - 34.0 pg   MCHC 34.5 30.0 - 36.0 g/dL   RDW 13.1 11.5 - 15.5 %   Platelets 185 150 - 400 K/uL     Blood Alcohol level:  Lab Results  Component Value Date   ETH <5 01/08/2017   ETH <5 70/08/7492    Metabolic Disorder Labs: No results found for: HGBA1C, MPG No results found for: PROLACTIN No results found for: CHOL, TRIG, HDL, CHOLHDL, VLDL, LDLCALC  Physical Findings: AIMS: Facial and Oral Movements Muscles of Facial Expression: None, normal Lips and Perioral Area: None, normal Jaw: None, normal Tongue: None, normal,Extremity Movements Upper (arms, wrists, hands, fingers): None, normal Lower (legs, knees, ankles, toes): None, normal, Trunk Movements Neck, shoulders, hips: None, normal, Overall Severity Severity of abnormal movements (highest score from questions above): None, normal Incapacitation due to abnormal movements: None, normal Patient's awareness of abnormal movements (rate only patient's report): No Awareness, Dental Status Current problems with teeth and/or dentures?: No Does patient usually wear dentures?: No  CIWA:  CIWA-Ar Total: 4 COWS:  COWS Total Score: 2  Musculoskeletal: Strength & Muscle Tone: within normal limits Gait & Station: normal Patient leans: N/A  Psychiatric Specialty Exam: Physical Exam  Constitutional: He is oriented to person, place, and time. He appears well-developed and well-nourished.  HENT:  Head: Normocephalic and atraumatic.  Eyes: Conjunctivae are normal. Pupils are equal, round, and reactive to light.  Neck: Normal range of motion. Neck supple.  Cardiovascular: Normal rate and regular rhythm.   Respiratory: Effort normal and breath sounds normal.  GI: Soft. Bowel sounds are normal.  Musculoskeletal: Normal range of motion.  Neurological: He is alert and oriented to person, place, and time.  Skin: Skin is warm and dry.  Psychiatric:  As above.     ROS  Blood pressure 138/79, pulse 86, temperature 98.2 F (36.8 C), temperature source Oral, resp. rate 18, height _0  (1.854 m), weight 81.9 kg (180 lb 8 oz), SpO2 100  %.Body mass index is 23.81 kg/m.  General Appearance: Better grooming today. Limited eye contact. Somewhat odd.   Eye Contact:  Minimal  Speech:  Clear and Coherent and Normal Rate  Volume:  Normal  Mood:  Overwhelmed by his subjective experience.   Affect:  Restricted  Thought Process:  Linear  Orientation:  Full (Time, Place, and Person)  Thought Content:  Ilusions, Paranoid Ideation and Rumination  Suicidal Thoughts:  Yes.  without intent/plan  Homicidal Thoughts:  No  Memory:  Immediate;   Good Recent;   Good Remote;   Good  Judgement:  Fair  Insight:  Fair  Psychomotor Activity:  Normal  Concentration:  Concentration: Fair and Attention Span: Fair  Recall:  Good  Fund of Knowledge:  Good  Language:  Good  Akathisia:  No  Handed:    AIMS (if indicated):     Assets:  Desire for Improvement Physical Health Resilience  ADL's:  Intact  Cognition:  WNL  Sleep:        Treatment Plan Summary: Patient still has some psychotic features. Unclear if this is solely related to substances. Associated  fleeting suicidal thoughts. No intent to act. No violent thoughts towards others. We have agreed to target psychosis with Risperidone.   Psychiatric: Schizoaffective disorder  SUD Substance Induced Mood Disorder  Medical:  Psychosocial:  Unemployed  Limited social outlet  PLAN: 1. Risperidone 1 mg HS 2. Continue to monitor mood, behavior and interaction with peers   [place]ist [place], MD 01/10/2017, 5:31 PM

## 2017-01-10 NOTE — Progress Notes (Signed)
  DATA ACTION RESPONSE  Objective- Pt. is visible in the room, seen laying in bed with eyes open. Presents with a flat/depressed affect and mood. Pt. brightens with interaction. Is isolative to room provided with encouragement. No further c/o.  Subjective- Denies having any HI/AVH/Pain at this time. Endorses passive SI but verbal contracts for safety. Pt. states " I just argue with my mom a lot; I need to move out". Is cooperative and remain safe on the unit.  1:1 interaction in private to establish rapport. Encouragement, education, & support given from staff. No meds ordered at bedtime. No PRNs needed.   Safety maintained with Q 15 checks. Continue with POC.

## 2017-01-10 NOTE — [facility] Group Notes (Signed)
[facility] LCSW Group Therapy  01/10/2017 1:12 PM  Type of Therapy:  Group Therapy  Participation Level:  Active  Participation Quality:  Attentive  Affect:  Depressed, Irritable and Labile  Cognitive:  Disorganized  Insight:  Lacking  Engagement in Therapy:  Lacking  Modes of Intervention:  Confrontation, Discussion, Education and Support  Summary of Progress/Problems: Feelings Around Diagnosis. Patients were asked to explore feelings associated with being diagnosed with a mental illness/substance abuse disorder. Group member were asked to identify support network and explore their understanding of their specific diagnosis, whether they agree with it, and how they can use this information to assist in recovery. Lawrence [name] shared that he cannot seem to understand what is wrong with his mind "I'm just done. Done." Lawrence [name] stated that he is having a bad day today, "full of anxiety and feeling worthless." His mother and grandmother will be visiting him today. Lawrence [name] is difficult to follow in conversation, often making bizarre, off topic statements. He reports feeling scared for himself due to his mindset and is able to contract for safety on the unit. He is agreeable to taking his medications and is hoping for more time in the hospital to stabilize. CSW offered emotional support and encouragement.   Sharia Averitt N Smart LCSW 01/10/2017, 1:12 PM

## 2017-01-10 NOTE — Progress Notes (Signed)
Pt attended the evening karaoke group. Pt was engaged but did not sing a song.

## 2017-01-10 NOTE — [facility] Group Notes (Signed)
The focus of this group is to educate the patient on the purpose and policies of crisis stabilization and provide a format to answer questions about their admission.  The group details unit policies and expectations of patients while admitted.  Patient attended 0900 nurse education orientation group this morning.  Patient actively participated and had appropriate affect.  Patient alert.  Patient had appropriate insight and appropriate engagement.  Today patient will work on 3 goals for discharge.   

## 2017-01-10 NOTE — Progress Notes (Signed)
D:  Patient's self inventory sheet, patient sleeps good, no sleep medication given.  Fair appetite, normal energy level, poor concentration.  Rated depression and anxiety 9, hopeless 10.  Denied withdrawals.  Then checked, irritability.  SI, sometimes, contracts for safety.  Denied physical problems.  Then checked headaches.  Denied physical pain.  Goal is "hope"  No discharge plans. A:  Medications administered per MD orders.  Emotional support and encouragement given patient. R:  Denied SI and HI this morning while talking to nurse, contracts for safety.  Denied A/V hallucinations.  Safety maintained with 15 minute checks.

## 2017-01-10 NOTE — Progress Notes (Signed)
Pt did not attend N/A group, pt remained in bed. Encouraged to attend group.

## 2017-01-10 NOTE — Plan of Care (Signed)
Problem: Coping: Goal: Ability to cope will improve Outcome: Progressing Nurse discussed depression/coping skills with patient.    

## 2017-01-10 NOTE — [facility] Suicide Risk Assessment (Signed)
[facility] INPATIENT:  Family/Significant Other Suicide Prevention Education  Suicide Prevention Education:  Education Completed; Rosalita Chessmananika Clark, Pt's mother [phone], has been identified by the patient as the family member/significant other with whom the patient will be residing, and identified as the person(s) who will aid the patient in the event of a mental health crisis (suicidal ideations/suicide attempt).  With written consent from the patient, the family member/significant other has been provided the following suicide prevention education, prior to the and/or following the discharge of the patient.  The suicide prevention education provided includes the following:  Suicide risk factors  Suicide prevention and interventions  National Suicide Hotline telephone number  [facility] assessment telephone number  [facility] 911  [facility] and/or [facility] telephone number  Request made of family/significant other to:  Remove weapons (e.g., guns, rifles, knives), all items previously/currently identified as safety concern.    Remove drugs/medications (over-the-counter, prescriptions, illicit drugs), all items previously/currently identified as a safety concern.  The family member/significant other verbalizes understanding of the suicide prevention education information provided.  The family member/significant other agrees to remove the items of safety concern listed above.  Pt mother expressed that Pt sounds some better today than on day of admission to ED. She reports increased agitation and aggression at home. She states that Pt can return home. She describes increased mood lability day- to-day.   Verdene LennertLauren C Demondre Aguas 01/10/2017, 3:14 PM

## 2017-01-10 NOTE — Plan of Care (Signed)
Problem: Activity: Goal: Interest or engagement in activities will improve Outcome: Progressing Pt. attended karaoke this evening.    

## 2017-01-10 NOTE — Tx Team (Signed)
Interdisciplinary Treatment and Diagnostic Plan Update  01/10/2017 Time of Session: 0930 Lawrence [name] MRN: [id number]  Principal Diagnosis: Substance induced mood disorder (HCC)  Secondary Diagnoses: Principal Problem:   Substance induced mood disorder (HCC) Active Problems:   Schizoaffective disorder, bipolar type (HCC)   Current Medications:  Current Facility-Administered Medications  Medication Dose Route Frequency Provider Last Rate Last Dose  . alum & mag hydroxide-simeth (MAALOX/MYLANTA) 200-200-20 MG/5ML suspension 30 mL  30 mL Oral Q4H PRN Charm RingsLord, Jamison Y, NP      . escitalopram (LEXAPRO) tablet 10 mg  10 mg Oral Daily Charm RingsLord, Jamison Y, NP   10 mg at 01/10/17 0856  . magnesium hydroxide (MILK OF MAGNESIA) suspension 30 mL  30 mL Oral Daily PRN Charm RingsLord, Jamison Y, NP      . Oxcarbazepine (TRILEPTAL) tablet 300 mg  300 mg Oral BID Charm RingsLord, Jamison Y, NP   300 mg at 01/10/17 [id number]   PTA Medications: Prescriptions Prior to Admission  Medication Sig Dispense Refill Last Dose  . alum & mag hydroxide-simeth (MAALOX/MYLANTA) 200-200-20 MG/5ML suspension Take 30 mLs by mouth every 4 (four) hours as needed for indigestion. 355 mL 0   . EPINEPHrine (EPIPEN JR) 0.15 MG/0.3ML injection Inject 0.15 mg into the muscle as needed for anaphylaxis.    not needed  . escitalopram (LEXAPRO) 10 MG tablet Take 1 tablet (10 mg total) by mouth daily. For depression 30 tablet 0 01/08/2017 at 0700  . magnesium hydroxide (MILK OF MAGNESIA) 400 MG/5ML suspension Take 30 mLs by mouth daily as needed for mild constipation. 360 mL 0   . Oxcarbazepine (TRILEPTAL) 300 MG tablet Take 1 tablet (300 mg total) by mouth 2 (two) times daily. For mood stabilization 60 tablet 0 01/08/2017 at 0700    Patient Stressors: Educational concerns Occupational concerns Substance abuse  Patient Strengths: Ability for insight Average or above average intelligence Communication skills Supportive family/friends  Treatment  Modalities: Medication Management, Group therapy, Case management,  1 to 1 session with clinician, Psychoeducation, Recreational therapy.   Physician Treatment Plan for Primary Diagnosis: Substance induced mood disorder (HCC) Long Term Goal(s): Improvement in symptoms so as ready for discharge Improvement in symptoms so as ready for discharge   Short Term Goals: Ability to identify changes in lifestyle to reduce recurrence of condition will improve Ability to verbalize feelings will improve Ability to disclose and discuss suicidal ideas Ability to demonstrate self-control will improve Ability to identify and develop effective coping behaviors will improve Compliance with prescribed medications will improve Ability to identify triggers associated with substance abuse/mental health issues will improve  Medication Management: Evaluate patient's response, side effects, and tolerance of medication regimen.  Therapeutic Interventions: 1 to 1 sessions, Unit Group sessions and Medication administration.  Evaluation of Outcomes: Progressing  Physician Treatment Plan for Secondary Diagnosis: Principal Problem:   Substance induced mood disorder (HCC) Active Problems:   Schizoaffective disorder, bipolar type (HCC)  Long Term Goal(s): Improvement in symptoms so as ready for discharge Improvement in symptoms so as ready for discharge   Short Term Goals: Ability to identify changes in lifestyle to reduce recurrence of condition will improve Ability to verbalize feelings will improve Ability to disclose and discuss suicidal ideas Ability to demonstrate self-control will improve Ability to identify and develop effective coping behaviors will improve Compliance with prescribed medications will improve Ability to identify triggers associated with substance abuse/mental health issues will improve     Medication Management: Evaluate patient's response, side effects, and tolerance of medication  regimen.  Therapeutic Interventions: 1 to 1 sessions, Unit Group sessions and Medication administration.  Evaluation of Outcomes: Progressing   RN Treatment Plan for Primary Diagnosis: Substance induced mood disorder (HCC) Long Term Goal(s): Knowledge of disease and therapeutic regimen to maintain health will improve  Short Term Goals: Ability to remain free from injury will improve, Ability to verbalize feelings will improve and Ability to disclose and discuss suicidal ideas  Medication Management: RN will administer medications as ordered by provider, will assess and evaluate patient's response and provide education to patient for prescribed medication. RN will report any adverse and/or side effects to prescribing provider.  Therapeutic Interventions: 1 on 1 counseling sessions, Psychoeducation, Medication administration, Evaluate responses to treatment, Monitor vital signs and CBGs as ordered, Perform/monitor CIWA, COWS, AIMS and Fall Risk screenings as ordered, Perform wound care treatments as ordered.  Evaluation of Outcomes: Progressing   LCSW Treatment Plan for Primary Diagnosis: Substance induced mood disorder (HCC) Long Term Goal(s): Safe transition to appropriate next level of care at discharge, Engage patient in therapeutic group addressing interpersonal concerns.  Short Term Goals: Engage patient in aftercare planning with referrals and resources, Facilitate patient progression through stages of change regarding substance use diagnoses and concerns and Identify triggers associated with mental health/substance abuse issues  Therapeutic Interventions: Assess for all discharge needs, 1 to 1 time with Social worker, Explore available resources and support systems, Assess for adequacy in community support network, Educate family and significant other(s) on suicide prevention, Complete Psychosocial Assessment, Interpersonal group therapy.  Evaluation of Outcomes:  Progressing   Progress in Treatment: Attending groups: Yes. Participating in groups: Yes. Taking medication as prescribed: Yes. Toleration medication: Yes. Family/Significant other contact made: No, will contact:  pt's parents for collateral information and to complete SPE Patient understands diagnosis: Yes. Discussing patient identified problems/goals with staff: Yes. Medical problems stabilized or resolved: Yes. Denies suicidal/homicidal ideation: Yes. Issues/concerns per patient self-inventory: No. Other: n/a   New problem(s) identified: No, Describe:  n/a  New Short Term/Long Term Goal(s): detox; medication stabilization; elimination of AVH and SI thoughts; mood stabilization; development of comprehensive mental wellness/sobriety plan.   Discharge Plan or Barriers: CSW assessing for appropriate referrals. Pt goes to [facility] for medication management and [facility]. Pt given [facility] pamphlet/information as well.  Reason for Continuation of Hospitalization: Depression Hallucinations Medication stabilization Suicidal ideation Withdrawal symptoms  Estimated Length of Stay: 2-4 days   Attendees: Patient: 01/10/2017 10:46 AM  Physician: Dr. Jackquline Berlin MD 01/10/2017 10:46 AM  Nursing: Peterson Ao RN 01/10/2017 10:46 AM  RN Care Manager: Onnie Boer CM 01/10/2017 10:46 AM  Social Worker: Herbert Seta Smart, LCSW; Donnelly Stager LCSWA 01/10/2017 10:46 AM  Recreational Therapist: x 01/10/2017 10:46 AM  Other: Armandina Stammer NP; Gray Bernhardt NP 01/10/2017 10:46 AM  Other:  01/10/2017 10:46 AM  Other: 01/10/2017 10:46 AM    Scribe for Treatment Team: Ledell Peoples Smart, LCSW 01/10/2017 10:46 AM

## 2017-01-11 NOTE — Progress Notes (Signed)
Recreation Therapy Notes  Date: 01/11/17 Time: 0930 Location: 300 Hall Dayroom  Group Topic: Stress Management  Goal Area(s) Addresses:  Patient will verbalize importance of using healthy stress management.  Patient will identify positive emotions associated with healthy stress management.   Behavioral Response: Engaged  Intervention: Stress Management  Activity :  Gratitude Meditation.  LRT introduced the stress management technique of meditation to patients.  LRT played a meditation from the Calm app to allow patients to engage in the technique.  Patients were to follow along as the meditation played to participate in the practice.  Education:  Stress Management, Discharge Planning.   Education Outcome: Acknowledges edcuation/In group clarification offered/Needs additional education  Clinical Observations/Feedback: Pt attended group.   Caroll RancherMarjette Shaunna Rosetti, LRT/CTRS        Lillia AbedLindsay, Lylah Lantis A 01/11/2017 11:09 AM

## 2017-01-11 NOTE — [facility] Group Notes (Signed)
[facility] LCSW Group Therapy  01/11/2017 3:30 PM  Type of Therapy:  Group Therapy  Participation Level:  Did Not Attend-pt in room resting. Invited.   Summary of Progress/Problems: Today's Topic: Overcoming Obstacles. Patients identified one short term goal and potential obstacles in reaching this goal. Patients processed barriers involved in overcoming these obstacles. Patients identified steps necessary for overcoming these obstacles and explored motivation (internal and external) for facing these difficulties head on.   Fermina Mishkin N Smart LCSW 01/11/2017, 3:30 PM

## 2017-01-11 NOTE — Progress Notes (Signed)
  DATA ACTION RESPONSE  Objective- Pt. is visible in the dayroom, seen interacting with peers.  Presents with a anxious/depressed affect and mood. Pt. requested something for sleep. Orders obtained. See MAR. No abnormal s/s.  Subjective- Denies having any HI/AVH/Pain at this time. Passive SI but verbally contacts for safety.  Endorsing racing thoughts.  Pt. 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 My mom just makes me angry".Is cooperative and remain safe on the unit.  1:1 interaction in private to establish rapport. Encouragement, education, & support given from staff.    Safety maintained with Q 15 checks. Continue with POC.

## 2017-01-11 NOTE — Progress Notes (Signed)
Pt attended the evening AA speaker meeting. Pt was engaged and appropriate. Shakiah Wester C, NT 01/11/17 9:59 PM  

## 2017-01-11 NOTE — Progress Notes (Signed)
Patient ID: [name], male   DOB: Jan 11, 1996, 21 y.o.   MRN: [id number]   D: Patient observed watching TV and interacting well with peers on approach. 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 Pt stated he has a lot of ideas but is not able to move forward and get started. Denies  SI/HI/AVH and pain.No behavioral issues noted.  A: Support and encouragement offered as needed. Medications administered as prescribed.  R: Patient is safe and cooperative on unit. Will continue to monitor patient for safety and stability.

## 2017-01-11 NOTE — Progress Notes (Signed)
DAR NOTE: Patient presents with bright  affect and jovial  mood.  Denies pain, auditory and visual hallucinations.  Rates depression at 8, hopelessness at 7, and anxiety at 7. Pt observed interacting with peers in the day room with any problems. Maintained on routine safety checks.  Medications given as prescribed.  Support and encouragement offered as needed.  Attended group and participated.  States goal for today is "hope."  Patient observed socializing with peers in the dayroom.  Offered no complaint.

## 2017-01-11 NOTE — Progress Notes (Signed)
[facility] MD Progress Note  01/11/2017 3:36 PM Lawrence [name]  MRN:  [id number] Subjective:   21 yo AAM, single, lives with his mom, unemployed. Presented for voluntary admission after an argument with his mom. Reports suicidal thoughts at presentation. Reports feeling depressed. Has not been socializing. Not in any relationship. Not currently employed and stays to self all the time. Daily use of THC. 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 Has not used any lately. UDS tested positive for THC.  Chart reviewed. Patient discussed at team  Staff reports that he is engaging more. He slept well last night. No behavioral issues. He is tolerating his medications well.   Seen today. Reports feeling better on Risperidone. Says feelings of a presence is less. He feels more relaxed. Suicidal thoughts are much less. He is now able to associate freely with peers. Says he slept well last night. No hallucination in any modality. He is about to out for gymn. No side effects from his medications.   Principal Problem: Substance induced mood disorder (HCC) Diagnosis:   Patient Active Problem List   Diagnosis Date Noted  . Schizoaffective disorder, bipolar type (HCC) [F25.0] 01/09/2017  . Intermittent explosive disorder [F63.81] 05/13/2016  . Cannabis use disorder, severe, dependence (HCC) [F12.20] 05/13/2016  . Substance induced mood disorder (HCC) [F19.94] 05/13/2016  . Intoxication by drug [facility]) [W09.811] 07/12/2014  . Intoxication [IMO0002] 07/12/2014  . Depression [F32.9] 07/12/2014  . Overdose of antitussive [T48.3X1A]    Total Time spent with patient: 20 minutes  Past Psychiatric History: As in H&P  Past Medical History:  Past Medical History:  Diagnosis Date  . ADD (attention deficit disorder)   . Asthma   . Depressed   . Schizoaffective disorder (HCC)    History reviewed. No pertinent surgical history. Family History: History reviewed. No pertinent family history. Family Psychiatric  History: As  in H&P Social History:  History  Alcohol Use No     History  Drug Use  . Types: Marijuana    Social History   Social History  . Marital status: Single    Spouse name: N/A  . Number of children: N/A  . Years of education: N/A   Social History Main Topics  . Smoking status: Current Some Day Smoker    Types: Cigars  . Smokeless tobacco: Never Used  . Alcohol use No  . Drug use: Yes    Types: Marijuana  . Sexual activity: No   Other Topics Concern  . None   Social History Narrative  . None   Additional Social History:    Pain Medications: n/a Prescriptions: SEE MAR Over the Counter: Pt denies History of alcohol / drug use?: Yes Longest period of sobriety (when/how long): UNKNOWN Negative Consequences of Use: Work / School Withdrawal Symptoms: Agitation, Aggressive/Assaultive, Irritability Name of Substance 1: CANNABIS -  TESTED POSITVE TONIGHT IN ED 1 - Age of First Use: TEENS 1 - Amount (size/oz): VARIES 1 - Frequency:  DAILY WHEN HE CAN GET IT 1 - Duration: ONGOING 1 - Last Use / Amount: 01/08/17 Name of Substance 2: COCAINE - TESTED NEGATIVE IN ED TONIGHT 2 - Age of First Use: TEENS 2 - Amount (size/oz): VARIES 2 - Frequency: MAX 1 X MONTH 2 - Duration: ONGOING 2 - Last Use / Amount: "DONT REMEMBER" Name of Substance 3: NICOTINE/CIGARETTES 3 - Age of First Use: TEENS 3 - Amount (size/oz): 2-3 CIGARETTES 3 - Frequency: DAILY 3 - Duration: ONGOING 3 - Last Use / Amount: 01/08/17  Name of Substance 4: ALCOHOL - TESTED NEGATIVE IN ED 4 - Age of First Use: TEENS 4 - Amount (size/oz): UNK 4 - Frequency: UNK 4 - Duration: UNK 4 - Last Use / Amount: STOPPED OVER 1 YR AGO PER PT Name of Substance 5: XANAX-TESTED NEGATIVE IN ED 5 - Age of First Use: TEENS 5 - Amount (size/oz): UNK 5 - Frequency: UNK 5 - Duration: UNK 5 - Last Use / Amount: STOPPED OVER 1 YR AGO PER PT          Sleep: Good  Appetite:  Good  Current Medications: Current  Facility-Administered Medications  Medication Dose Route Frequency Provider Last Rate Last Dose  . alum & mag hydroxide-simeth (MAALOX/MYLANTA) 200-200-20 MG/5ML suspension 30 mL  30 mL Oral Q4H PRN Charm RingsLord, Jamison Y, NP      . escitalopram (LEXAPRO) tablet 10 mg  10 mg Oral Daily Charm RingsLord, Jamison Y, NP   10 mg at 01/11/17 [id number]  . magnesium hydroxide (MILK OF MAGNESIA) suspension 30 mL  30 mL Oral Daily PRN Charm RingsLord, Jamison Y, NP      . Oxcarbazepine (TRILEPTAL) tablet 300 mg  300 mg Oral BID Charm RingsLord, Jamison Y, NP   300 mg at 01/11/17 [id number]  . risperiDONE (RISPERDAL) tablet 1 mg  1 mg Oral QHS Izediuno, Delight OvensVincent A, MD   1 mg at 01/10/17 2149  . traZODone (DESYREL) tablet 50 mg  50 mg Oral QHS,MR X 1 Nira ConnBerry, Jason A, NP   50 mg at 01/10/17 2245    Lab Results:  No results found for this or any previous visit (from the past 48 hour(s)).  Blood Alcohol level:  Lab Results  Component Value Date   ETH <5 01/08/2017   ETH <5 05/12/2016    Metabolic Disorder Labs: No results found for: HGBA1C, MPG No results found for: PROLACTIN No results found for: CHOL, TRIG, HDL, CHOLHDL, VLDL, LDLCALC  Physical Findings: AIMS: Facial and Oral Movements Muscles of Facial Expression: None, normal Lips and Perioral Area: None, normal Jaw: None, normal Tongue: None, normal,Extremity Movements Upper (arms, wrists, hands, fingers): None, normal Lower (legs, knees, ankles, toes): None, normal, Trunk Movements Neck, shoulders, hips: None, normal, Overall Severity Severity of abnormal movements (highest score from questions above): None, normal Incapacitation due to abnormal movements: None, normal Patient's awareness of abnormal movements (rate only patient's report): No Awareness, Dental Status Current problems with teeth and/or dentures?: No Does patient usually wear dentures?: No  CIWA:  CIWA-Ar Total: 1 COWS:  COWS Total Score: 2  Musculoskeletal: Strength & Muscle Tone: within normal limits Gait & Station:  normal Patient leans: N/A  Psychiatric Specialty Exam: Physical Exam  Constitutional: He is oriented to person, place, and time. He appears well-developed and well-nourished.  HENT:  Head: Normocephalic and atraumatic.  Eyes: Conjunctivae are normal. Pupils are equal, round, and reactive to light.  Neck: Normal range of motion. Neck supple.  Cardiovascular: Normal rate and regular rhythm.   Respiratory: Effort normal and breath sounds normal.  GI: Soft. Bowel sounds are normal.  Musculoskeletal: Normal range of motion.  Neurological: He is alert and oriented to person, place, and time.  Skin: Skin is warm and dry.  Psychiatric:  As above.     ROS  Blood pressure 112/61, pulse 76, temperature 97.9 F (36.6 C), resp. rate 16, height 6\' 1"  (1.854 m), weight 81.9 kg (180 lb 8 oz), SpO2 100 %.Body mass index is 23.81 kg/m.  General Appearance: Neatly dressed, calm and cooperative.  Much more appropriate today. Relates well.   Eye Contact:  Better  Speech:  Clear and Coherent and Normal Rate  Volume:  Normal  Mood: Feels much better  Affect:  Mobilizing some affect.   Thought Process:  Linear  Orientation:  Full (Time, Place, and Person)  Thought Content:  No delusional theme. No preoccupation with violent thoughts. No negative ruminations. No obsession.  No hallucination in any modality.   Suicidal Thoughts:  Much less  Homicidal Thoughts:  No  Memory:  Immediate;   Good Recent;   Good Remote;   Good  Judgement:  Better  Insight:  Better  Psychomotor Activity:  Normal  Concentration:  Concentration: Fair and Attention Span: Fair  Recall:  Good  Fund of Knowledge:  Good  Language:  Good  Akathisia:  No  Handed:    AIMS (if indicated):     Assets:  Desire for Improvement Physical Health Resilience  ADL's:  Intact  Cognition:  WNL  Sleep:  Number of Hours: 5.25     Treatment Plan Summary: Psychosis is responding to Risperidone. Suicidal thoughts are less. Needs further  evaluation.    Psychiatric: Schizoaffective disorder  SUD Substance Induced Mood Disorder  Medical:  Psychosocial:  Unemployed  Limited social outlet  PLAN: 1. Continue current regimen 2. Continue to monitor mood, behavior and interaction with peers   Georgiann Cocker, MD 01/11/2017, 3:36 PMPatient ID: Lawrence [name], male   DOB: 1995/10/07, 20 y.o.   MRN: [id number]

## 2017-01-12 MED ORDER — NICOTINE 14 MG/24HR TD PT24
14.0000 mg | MEDICATED_PATCH | Freq: Every day | TRANSDERMAL | Status: DC
  Filled 2017-01-12 (×4): qty 1

## 2017-01-12 MED ORDER — NICOTINE 14 MG/24HR TD PT24
14.0000 mg | MEDICATED_PATCH | TRANSDERMAL | Status: AC
  Administered 2017-01-12: 14 mg via TRANSDERMAL
  Filled 2017-01-12: qty 1

## 2017-01-12 MED ORDER — NICOTINE 14 MG/24HR TD PT24
MEDICATED_PATCH | TRANSDERMAL | Status: AC
  Administered 2017-01-12: 14 mg
  Filled 2017-01-12: qty 1

## 2017-01-12 NOTE — Plan of Care (Signed)
Problem: Medication: Goal: Compliance with prescribed medication regimen will improve Outcome: Progressing Pt compliant with medication regime this shift.   

## 2017-01-12 NOTE — Progress Notes (Signed)
Patient did not attend the evening speaker AA meeting. Pt entered the meeting but exited before fifteen minutes reporting that it was not what he needed to hear. Pt is irritable and explosive but is redirectable. Pt yelled and cursed at his visitors ordering them to leave during visitation. Pt shared with this Clinical research associatewriter that he was just "done with this life at [age] years old" Pt was able to tell this Clinical research associatewriter that he could see a future that was better for him. Pt shared that there is a lot of family conflict and he doesn't feel supported or understood.

## 2017-01-12 NOTE — Progress Notes (Signed)
Writer spoke with patient 1:1 at medication window. He requested a nicotine patch and reported that he left group early because he didn't want to listen to what the speaker had to say because he has other issues to deal with. He reports that he currently has issues with his living situation and needs to work on expressing himself more freely. It was reported to Clinical research associatewriter by L.McNeil MHT  that he became upset with his visitors tonight reporting that they just don't understand. He spoke with MHT 1:1 and calmed down. Writer informed him of his medications due at hs and he requested to take them closer to 11 pm. Support given and safety maintained on unit with 15 min checks.

## 2017-01-12 NOTE — [facility] Group Notes (Signed)
[facility] LCSW Group Therapy Note  01/12/2017  and  10:00 AM  Type of Therapy and Topic:  Group Therapy: Avoiding Self-Sabotaging and Enabling Behaviors  Participation Level:  Active  Participation Quality:  Monopolizing and Sharing  Affect:  Lethargic  Cognitive:  Alert and Oriented  Insight:  Monopolizing  Engagement in Therapy:  Monopolizing   Therapeutic models used: Engineer, manufacturing systemsCognitive Behavioral Therapy,  Person-Centered Therapy and Motivational Interviewing  Modes of Intervention:  Discussion, Exploration, Orientation, Rapport Building, Socialization and Support  Summary of Progress/Problems:  The main focus of today's process group was for the patient to identify ways in which they have in the past sabotaged their own recovery. Motivational Interviewing was utilized to identify motivation they may have for wanting to change. The Stages of Change were explained using a handout, and patients identified where they currently are with regard to stages of change.    Carney Bernatherine C Kazuto Sevey, LCSW

## 2017-01-12 NOTE — Progress Notes (Signed)
Patient ID: Lawrence [name], male   DOB: 1996-01-25, 21 y.o.   MRN: [id number]  DAR: Pt. Denies SI/HI and A/V Hallucinations. He reports sleep is good, appetite is good, energy level is normal, and concentration is poor. He rates depression 7/10, hopelessness 7/10, Patient does not report any pain or discomfort at this time. Support and encouragement provided to the patient to staff with questions, concerns, or needs. He verbalized understanding however has been minimal with Clinical research associatewriter throughout the day. Patient is anxious in mood and affect. He is seen in the milieu minimally. He reports some irritability on his daily inventory sheet. Q15 minute checks are maintained for safety.

## 2017-01-12 NOTE — Progress Notes (Signed)
Adult Psychoeducational Group Note  Date:  01/12/2017 Time: 1300  Group Topic/Focus:  Healthy Coping skills  Participation Level:  Active  Participation Quality:  Appropriate  Affect:  Appropriate  Cognitive:  Appropriate  Insight: Appropriate  Engagement in Group:  Engaged  Modes of Intervention:  Discussion and Education  Additional Comments:    Ottilie Wigglesworth L 01/12/2017, 4:20 PM 

## 2017-01-12 NOTE — Progress Notes (Signed)
[facility] MD Progress Note  01/12/2017 3:58 PM Lawrence [name]  MRN:  [id number] Subjective:   21 yo AAM, single, lives with his mom, unemployed. Presented for voluntary admission after an argument with his mom. Reports suicidal thoughts at presentation. Reports feeling depressed. Has not been socializing. Not in any relationship. Not currently employed and stays to self all the time. Daily use of THC. 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 Has not used any lately. UDS tested positive for THC.  Chart reviewed today. Patient discussed at team  Staff reports that he is participating at groups. No behavioral issues. He has not been observed to be internally preoccupied. No side effects from his medication. Enjoys going to the gymn.  Seen today. Maintains progress since starting Risperidone. Reports feeling better. Now more able to express himself. Says he is able to interact better with others. Less rage and irritability. No delusional atmosphere. No paranoia. No other form of delusion. No futility thoughts. No evidence of depression.  Says he has been talking with his family. They are visiting this evening.   Principal Problem: Substance induced mood disorder (HCC) Diagnosis:   Patient Active Problem List   Diagnosis Date Noted  . Schizoaffective disorder, bipolar type (HCC) [F25.0] 01/09/2017  . Intermittent explosive disorder [F63.81] 05/13/2016  . Cannabis use disorder, severe, dependence (HCC) [F12.20] 05/13/2016  . Substance induced mood disorder (HCC) [F19.94] 05/13/2016  . Intoxication by drug [facility]) [X91.478] 07/12/2014  . Intoxication [IMO0002] 07/12/2014  . Depression [F32.9] 07/12/2014  . Overdose of antitussive [T48.3X1A]    Total Time spent with patient: 20 minutes  Past Psychiatric History: As in H&P  Past Medical History:  Past Medical History:  Diagnosis Date  . ADD (attention deficit disorder)   . Asthma   . Depressed   . Schizoaffective disorder (HCC)    History reviewed.  No pertinent surgical history. Family History: History reviewed. No pertinent family history. Family Psychiatric  History: As in H&P Social History:  History  Alcohol Use No     History  Drug Use  . Types: Marijuana    Social History   Social History  . Marital status: Single    Spouse name: N/A  . Number of children: N/A  . Years of education: N/A   Social History Main Topics  . Smoking status: Current Some Day Smoker    Types: Cigars  . Smokeless tobacco: Never Used  . Alcohol use No  . Drug use: Yes    Types: Marijuana  . Sexual activity: No   Other Topics Concern  . None   Social History Narrative  . None   Additional Social History:    Pain Medications: n/a Prescriptions: SEE MAR Over the Counter: Pt denies History of alcohol / drug use?: Yes Longest period of sobriety (when/how long): UNKNOWN Negative Consequences of Use: Work / School Withdrawal Symptoms: Agitation, Aggressive/Assaultive, Irritability Name of Substance 1: CANNABIS -  TESTED POSITVE TONIGHT IN ED 1 - Age of First Use: TEENS 1 - Amount (size/oz): VARIES 1 - Frequency:  DAILY WHEN HE CAN GET IT 1 - Duration: ONGOING 1 - Last Use / Amount: 01/08/17 Name of Substance 2: COCAINE - TESTED NEGATIVE IN ED TONIGHT 2 - Age of First Use: TEENS 2 - Amount (size/oz): VARIES 2 - Frequency: MAX 1 X MONTH 2 - Duration: ONGOING 2 - Last Use / Amount: "DONT REMEMBER" Name of Substance 3: NICOTINE/CIGARETTES 3 - Age of First Use: TEENS 3 - Amount (size/oz): 2-3 CIGARETTES 3 -  Frequency: DAILY 3 - Duration: ONGOING 3 - Last Use / Amount: 01/08/17 Name of Substance 4: ALCOHOL - TESTED NEGATIVE IN ED 4 - Age of First Use: TEENS 4 - Amount (size/oz): UNK 4 - Frequency: UNK 4 - Duration: UNK 4 - Last Use / Amount: STOPPED OVER 1 YR AGO PER PT Name of Substance 5: XANAX-TESTED NEGATIVE IN ED 5 - Age of First Use: TEENS 5 - Amount (size/oz): UNK 5 - Frequency: UNK 5 - Duration: UNK 5 - Last Use /  Amount: STOPPED OVER 1 YR AGO PER PT          Sleep: Good  Appetite:  Good  Current Medications: Current Facility-Administered Medications  Medication Dose Route Frequency Provider Last Rate Last Dose  . alum & mag hydroxide-simeth (MAALOX/MYLANTA) 200-200-20 MG/5ML suspension 30 mL  30 mL Oral Q4H PRN Charm Rings, NP      . escitalopram (LEXAPRO) tablet 10 mg  10 mg Oral Daily Charm Rings, NP   10 mg at 01/12/17 0837  . magnesium hydroxide (MILK OF MAGNESIA) suspension 30 mL  30 mL Oral Daily PRN Charm Rings, NP      . Oxcarbazepine (TRILEPTAL) tablet 300 mg  300 mg Oral BID Charm Rings, NP   300 mg at 01/12/17 1610  . risperiDONE (RISPERDAL) tablet 1 mg  1 mg Oral QHS Izediuno, Delight Ovens, MD   1 mg at 01/11/17 2145  . traZODone (DESYREL) tablet 50 mg  50 mg Oral QHS,MR X 1 [name] A, NP   50 mg at 01/11/17 2145    Lab Results:  No results found for this or any previous visit (from the past 48 hour(s)).  Blood Alcohol level:  Lab Results  Component Value Date   ETH <5 01/08/2017   ETH <5 05/12/2016    Metabolic Disorder Labs: No results found for: HGBA1C, MPG No results found for: PROLACTIN No results found for: CHOL, TRIG, HDL, CHOLHDL, VLDL, LDLCALC  Physical Findings: AIMS: Facial and Oral Movements Muscles of Facial Expression: None, normal Lips and Perioral Area: None, normal Jaw: None, normal Tongue: None, normal,Extremity Movements Upper (arms, wrists, hands, fingers): None, normal Lower (legs, knees, ankles, toes): None, normal, Trunk Movements Neck, shoulders, hips: None, normal, Overall Severity Severity of abnormal movements (highest score from questions above): None, normal Incapacitation due to abnormal movements: None, normal Patient's awareness of abnormal movements (rate only patient's report): No Awareness, Dental Status Current problems with teeth and/or dentures?: No Does patient usually wear dentures?: No  CIWA:  CIWA-Ar  Total: 1 COWS:  COWS Total Score: 2  Musculoskeletal: Strength & Muscle Tone: within normal limits Gait & Station: normal Patient leans: N/A  Psychiatric Specialty Exam: Physical Exam  Constitutional: He is oriented to person, place, and time. He appears well-developed and well-nourished.  HENT:  Head: Normocephalic and atraumatic.  Eyes: Conjunctivae are normal. Pupils are equal, round, and reactive to light.  Neck: Normal range of motion. Neck supple.  Cardiovascular: Normal rate and regular rhythm.   Respiratory: Effort normal and breath sounds normal.  GI: Soft. Bowel sounds are normal.  Musculoskeletal: Normal range of motion.  Neurological: He is alert and oriented to person, place, and time.  Skin: Skin is warm and dry.  Psychiatric:  As above.     ROS  Blood pressure 114/67, pulse 65, temperature 98.2 F (36.8 C), temperature source Oral, resp. rate 16, height 6\' 1"  (1.854 m), weight 81.9 kg (180 lb 8 oz),  SpO2 100 %.Body mass index is 23.81 kg/m.  General Appearance: Neatly dressed. Good relatedness. Appropriate.   Eye Contact:  Moderate   Speech:  Clear and Coherent and Normal Rate  Volume:  Normal  Mood: Euthymic  Affect: Restricted but appropriate.   Thought Process:  Linear  Orientation:  Full (Time, Place, and Person)  Thought Content:  No delusional theme. No preoccupation with violent thoughts. No negative ruminations. No obsession.  No hallucination in any modality.   Suicidal Thoughts:  None  Homicidal Thoughts:  No  Memory:  Immediate;   Good Recent;   Good Remote;   Good  Judgement:  Better  Insight:  Better  Psychomotor Activity:  Normal  Concentration:  WNL  Recall:  Good  Fund of Knowledge:  Good  Language:  Good  Akathisia:  No  Handed:    AIMS (if indicated):     Assets:  Desire for Improvement Physical Health Resilience  ADL's:  Intact  Cognition:  WNL  Sleep:  Number of Hours: 6.25     Treatment Plan Summary: Patient is making  good progress. He is less introverted. His family would be visiting today. Hopefully we can get some feedback from them.   Psychiatric: Schizoaffective disorder  SUD Substance Induced Mood Disorder  Medical:  Psychosocial:  Unemployed  Limited social outlet  PLAN: 1. Continue current regimen 2. Continue to monitor mood, behavior and interaction with peers 3. Feedback from his family 4. Hopeful discharge early next week.    Georgiann CockerVincent A Izediuno, MD 01/12/2017, 3:58 PMPatient ID: Lawrence [name], male   DOB: 03-Dec-1995, 20 y.o.   MRN: [id number] Patient ID: Lawrence MinusCameron [name], male   DOB: 03-Dec-1995, 21 y.o.   MRN: [id number]

## 2017-01-13 NOTE — [facility] Group Notes (Signed)
[facility] LCSW Group Therapy  01/13/2017  10 AM  Type of Therapy:  Group Therapy  Participation Level:  Active  Participation Quality:  Attentive and Sharing  Affect:  Flat  Cognitive:  Alert  Insight:  Developing/Improving  Engagement in Therapy:  Developing/Improving  Modes of Intervention:  Clarification, Exploration, Rapport Building, Socialization and Support  Summary of Progress/Problems: Topic for today was thoughts and feelings regarding discharge. We discussed fears of upcoming changes including judgements, expectations and stigma of mental health issues. We then discussed supports: what constitutes a supportive framework, identification of supports and what to do when others are not supportive. Patients then identified a specific coping tool to use when others are not available. Patient seems to learn best through talking which can be monopolizing yet open to redirection.    Lawrence Bernatherine C Harrill, LCSW

## 2017-01-13 NOTE — Progress Notes (Signed)
D:  Patient's self inventory sheet, patient sleeps good.  Good appetite, normal energy level, poor concentration.  Rated depression 6, hopeless and anxiety 7.  Denied withdrawals, checked irritability.  Denied SI.  Denied physical problems.  Denied physical pain.  Goal is "hope".  Plans is expression.   Does have discharge plans. A:  Medications administered per MD orders.  Emotional support and encouragement given patient. R:  Denied SI and HI, contracts for safety.  Denied visual hallucinations.  Stated he does hear voices at times throughout the day.  Safety maintained with 15 minute checks.

## 2017-01-13 NOTE — Progress Notes (Signed)
[facility]  01/13/2017 11:25 AM Lawrence MinusCameron [name]  MRN:  [id number] Subjective:   21 yo AAM, single, lives with his mom, unemployed. Presented for voluntary admission after an argument with his mom. Reports suicidal thoughts at presentation. Reports feeling depressed. Has not been socializing. Not in any relationship. Not currently employed and stays to self all the time. Daily use of THC. 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 Has not used any lately. UDS tested positive for THC.  Chart reviewed today. Patient discussed at team  Staff reports that he was upset with his family. He asked them to leave as he felt they were not supportive. At one of the groups, he walked out as he did feel he wanted to be part of what is being discussed. Otherwise had been interacting with peers. Has not voiced any suicidal thoughts lately. Has been tolerating his medications well. Not observed to be internally stimulated.   Seen today. States that his family has called today. They apologized and they have all agreed to do family therapy. States that his mom has been weighing him down with er own emotional issues all his life. Says he hopes family therapy would help them heal. Mentally feels better. His mind has cleared up. He no longer has any abnormal feelings. No suicidal thoughts. Says he plans to start school on Wednesday. No difficulties from his medications. He is agreeable to collateral from his family.  Principal Problem: Substance induced mood disorder (HCC) Diagnosis:   Patient Active Problem List   Diagnosis Date Noted  . Schizoaffective disorder, bipolar type (HCC) [F25.0] 01/09/2017  . Intermittent explosive disorder [F63.81] 05/13/2016  . Cannabis use disorder, severe, dependence (HCC) [F12.20] 05/13/2016  . Substance induced mood disorder (HCC) [F19.94] 05/13/2016  . Intoxication by drug [facility](HCC) [X52.841][F19.929] 07/12/2014  . Intoxication [IMO0002] 07/12/2014  . Depression [F32.9] 07/12/2014  .  Overdose of antitussive [T48.3X1A]    Total Time spent with patient: 20 minutes  Past Psychiatric History: As in H&P  Past Medical History:  Past Medical History:  Diagnosis Date  . ADD (attention deficit disorder)   . Asthma   . Depressed   . Schizoaffective disorder (HCC)    History reviewed. No pertinent surgical history. Family History: History reviewed. No pertinent family history. Family Psychiatric  History: As in H&P Social History:  History  Alcohol Use No     History  Drug Use  . Types: Marijuana    Social History   Social History  . Marital status: Single    Spouse name: N/A  . Number of children: N/A  . Years of education: N/A   Social History Main Topics  . Smoking status: Current Some Day Smoker    Types: Cigars  . Smokeless tobacco: Never Used  . Alcohol use No  . Drug use: Yes    Types: Marijuana  . Sexual activity: No   Other Topics Concern  . None   Social History Narrative  . None   Additional Social History:    Pain Medications: n/a Prescriptions: SEE MAR Over the Counter: Pt denies History of alcohol / drug use?: Yes Longest period of sobriety (when/how long): UNKNOWN Negative Consequences of Use: Work / School Withdrawal Symptoms: Agitation, Aggressive/Assaultive, Irritability Name of Substance 1: CANNABIS -  TESTED POSITVE TONIGHT IN ED 1 - Age of First Use: TEENS 1 - Amount (size/oz): VARIES 1 - Frequency:  DAILY WHEN HE CAN GET IT 1 - Duration: ONGOING 1 - Last Use / Amount:  01/08/17 Name of Substance 2: COCAINE - TESTED NEGATIVE IN ED TONIGHT 2 - Age of First Use: TEENS 2 - Amount (size/oz): VARIES 2 - Frequency: MAX 1 X MONTH 2 - Duration: ONGOING 2 - Last Use / Amount: "DONT REMEMBER" Name of Substance 3: NICOTINE/CIGARETTES 3 - Age of First Use: TEENS 3 - Amount (size/oz): 2-3 CIGARETTES 3 - Frequency: DAILY 3 - Duration: ONGOING 3 - Last Use / Amount: 01/08/17 Name of Substance 4: ALCOHOL - TESTED NEGATIVE IN ED 4  - Age of First Use: TEENS 4 - Amount (size/oz): UNK 4 - Frequency: UNK 4 - Duration: UNK 4 - Last Use / Amount: STOPPED OVER 1 YR AGO PER PT Name of Substance 5: XANAX-TESTED NEGATIVE IN ED 5 - Age of First Use: TEENS 5 - Amount (size/oz): UNK 5 - Frequency: UNK 5 - Duration: UNK 5 - Last Use / Amount: STOPPED OVER 1 YR AGO PER PT          Sleep: Good  Appetite:  Good  Current Medications: Current Facility-Administered Medications  Medication Dose Route Frequency Provider Last Rate Last Dose  . alum & mag hydroxide-simeth (MAALOX/MYLANTA) 200-200-20 MG/5ML suspension 30 mL  30 mL Oral Q4H PRN Charm Rings, NP      . escitalopram (LEXAPRO) tablet 10 mg  10 mg Oral Daily Charm Rings, NP   10 mg at 01/13/17 0743  . magnesium hydroxide (MILK OF MAGNESIA) suspension 30 mL  30 mL Oral Daily PRN Charm Rings, NP      . nicotine (NICODERM CQ - dosed in mg/24 hours) patch 14 mg  14 mg Transdermal Daily Cobos, Fernando A, MD      . nicotine (NICODERM CQ - dosed in mg/24 hours) patch 14 mg  14 mg Transdermal NOW Cobos, Rockey Situ, MD   14 mg at 01/12/17 2055  . Oxcarbazepine (TRILEPTAL) tablet 300 mg  300 mg Oral BID Charm Rings, NP   300 mg at 01/13/17 0744  . risperiDONE (RISPERDAL) tablet 1 mg  1 mg Oral QHS Longino Trefz, Delight Ovens, MD   1 mg at 01/12/17 2320  . traZODone (DESYREL) tablet 50 mg  50 mg Oral QHS,MR X 1 [name] A, NP   50 mg at 01/12/17 2320    Lab Results:  No results found for this or any previous visit (from the past 48 hour(s)).  Blood Alcohol level:  Lab Results  Component Value Date   ETH <5 01/08/2017   ETH <5 05/12/2016    Metabolic Disorder Labs: No results found for: HGBA1C, MPG No results found for: PROLACTIN No results found for: CHOL, TRIG, HDL, CHOLHDL, VLDL, LDLCALC  Physical Findings: AIMS: Facial and Oral Movements Muscles of Facial Expression: None, normal Lips and Perioral Area: None, normal Jaw: None, normal Tongue: None,  normal,Extremity Movements Upper (arms, wrists, hands, fingers): None, normal Lower (legs, knees, ankles, toes): None, normal, Trunk Movements Neck, shoulders, hips: None, normal, Overall Severity Severity of abnormal movements (highest score from questions above): None, normal Incapacitation due to abnormal movements: None, normal Patient's awareness of abnormal movements (rate only patient's report): No Awareness, Dental Status Current problems with teeth and/or dentures?: No Does patient usually wear dentures?: No  CIWA:  CIWA-Ar Total: 1 COWS:  COWS Total Score: 2  Musculoskeletal: Strength & Muscle Tone: within normal limits Gait & Station: normal Patient leans: N/A  Psychiatric Specialty Exam: Physical Exam  Constitutional: He is oriented to person, place, and time. He appears  well-developed and well-nourished.  HENT:  Head: Normocephalic and atraumatic.  Eyes: Conjunctivae are normal. Pupils are equal, round, and reactive to light.  Neck: Normal range of motion. Neck supple.  Cardiovascular: Normal rate and regular rhythm.   Respiratory: Effort normal and breath sounds normal.  GI: Soft. Bowel sounds are normal.  Musculoskeletal: Normal range of motion.  Neurological: He is alert and oriented to person, place, and time.  Skin: Skin is warm and dry.  Psychiatric:  As above.     ROS  Blood pressure 108/63, pulse 97, temperature 98.2 F (36.8 C), temperature source Oral, resp. rate 20, height 6\' 1"  (1.854 m), weight 81.9 kg (180 lb 8 oz), SpO2 100 %.Body mass index is 23.81 kg/m.  General Appearance: Neatly dressed, pleasant, engaging well and cooperative. Appropriate behavior. Not in any distress. Good relatedness. Not internally stimulated   Eye Contact:  Moderate   Speech:  Clear and Coherent and Normal Rate  Volume:  Normal  Mood: Euthymic  Affect: Full range and appropriate. Mobilizing positive affect appropriately.   Thought Process:  Linear  Orientation:  Full  (Time, Place, and Person)  Thought Content:  No delusional theme. No preoccupation with violent thoughts. No negative ruminations. No obsession.  No hallucination in any modality.   Suicidal Thoughts:  No  Homicidal Thoughts:  No  Memory:  Immediate;   Good Recent;   Good Remote;   Good  Judgement:  Good  Insight:  Good  Psychomotor Activity:  Normal  Concentration:  WNL  Recall:  Good  Fund of Knowledge:  Good  Language:  Good  Akathisia:  No  Handed:    AIMS (if indicated):     Assets:  Desire for Improvement Physical Health Resilience  ADL's:  Intact  Cognition:  WNL  Sleep:  Number of Hours: 4.75     Treatment Plan Summary: Patient is not psychotic or depressed. He is not a danger to himself or others. Hopeful discharge tomorrow.    Psychiatric: Schizoaffective disorder  SUD Substance Induced Mood Disorder  Medical:  Psychosocial:  Unemployed  Limited social outlet  PLAN: 1. Continue current regimen 2. Continue to monitor mood, behavior and interaction with peers 3. SW would obtain collateral from his family   Georgiann Cocker, MD 01/13/2017, 11:25 AMPatient ID: Lawrence [name], male   DOB: 05-02-1996, 20 y.o.   MRN: [id number] Patient ID: Lawrence [name], male   DOB: Nov 03, 1995, 21 y.o.   MRN: [id number] Patient ID: Lawrence [name], male   DOB: 10-02-1995, 21 y.o.   MRN: [id number]

## 2017-01-13 NOTE — Plan of Care (Signed)
Problem: Education: Goal: Utilization of techniques to improve thought processes will improve Outcome: Progressing Nurse discussed depression/coping skills with patient.    

## 2017-01-13 NOTE — [facility] Group Notes (Signed)
The focus of this group is to educate the patient on the purpose and policies of crisis stabilization and provide a format to answer questions about their admission.  The group details unit policies and expectations of patients while admitted.  Patient attended 0900 nurse education orientation group this morning.  Patient actively participated and had appropriate affect.  Patient is alert.  Patient had appropriate insight and appropriate engagement.  Today patient will work on 3 goals for discharge.  

## 2017-01-13 NOTE — Progress Notes (Signed)
[facility] Group Notes:  (Nursing/MHT/Case Management/Adjunct)  Date:  01/13/2017  Time:  2045  Type of Therapy:  wrap up group  Participation Level:  Active  Participation Quality:  Appropriate, Attentive, Sharing and Supportive  Affect:  Flat  Cognitive:  Alert  Insight:  Improving  Engagement in Group:  Engaged  Modes of Intervention:  Clarification, Education and Support  Summary of Progress/Problems: Pt shared that he is having better communication with his family and agreed to go to family counseling. Pt feels like his medicine is helping and is not having suicidal feelings. Pt shared that he would like to not put limits on himself because he often feels like he can not do a lot.   Marcille BuffyMcNeil, Donja Tipping S 01/13/2017, 9:27 PM

## 2017-01-14 MED ORDER — ESCITALOPRAM OXALATE 10 MG PO TABS: 10.0000 mg | ORAL_TABLET | Freq: Every day | ORAL | 0 refills | Status: DC

## 2017-01-14 MED ORDER — NICOTINE 14 MG/24HR TD PT24: 14.0000 mg | MEDICATED_PATCH | Freq: Every day | TRANSDERMAL | 0 refills | Status: DC

## 2017-01-14 MED ORDER — RISPERIDONE 1 MG PO TABS: 1.0000 mg | ORAL_TABLET | Freq: Every day | ORAL | 0 refills | Status: DC

## 2017-01-14 MED ORDER — TRAZODONE HCL 50 MG PO TABS
50.0000 mg | ORAL_TABLET | Freq: Every day | ORAL | Status: DC
  Filled 2017-01-14: qty 7

## 2017-01-14 MED ORDER — MAGNESIUM HYDROXIDE 400 MG/5ML PO SUSP: 30.0000 mL | Freq: Every day | ORAL | 0 refills | Status: DC | PRN

## 2017-01-14 MED ORDER — ALUM & MAG HYDROXIDE-SIMETH 200-200-20 MG/5ML PO SUSP: 30.0000 mL | ORAL | 0 refills | Status: DC | PRN

## 2017-01-14 MED ORDER — OXCARBAZEPINE 300 MG PO TABS: 300.0000 mg | ORAL_TABLET | Freq: Two times a day (BID) | ORAL | 0 refills | Status: DC

## 2017-01-14 MED ORDER — TRAZODONE HCL 50 MG PO TABS: ORAL_TABLET | ORAL | 0 refills | Status: DC

## 2017-01-14 NOTE — Progress Notes (Signed)
Discharge Note:  Patient discharged home with family member.  Patient denied SI and HI.  Denied A/V hallucinations.  Suicide prevention information given and discussed with patient who stated he understood and had no questions.  Patient stated he received all his belongings, clothing, toiletries, misc items, prescriptions, medications, shoes, phone, etc.  Patient stated he [facility].  All required discharge information given to patient at discharge.

## 2017-01-14 NOTE — Plan of Care (Signed)
Problem: Coping: Goal: Ability to cope will improve Outcome: Progressing Nurse discussed depression/anxiety/coping skills with patient.    

## 2017-01-14 NOTE — Progress Notes (Signed)
Recreation Therapy Notes  Date: 01/14/17 Time: 0930 Location: [address] Dayroom  Group Topic: Stress Management  Goal Area(s) Addresses:  Patient will verbalize importance of using healthy stress management.  Patient will identify positive emotions associated with healthy stress management.   Behavioral Response: Engaged  Intervention: Stress Management  Activity :  Guided Imagery.  LRT introduced the stress management concept of guided imagery.  LRT read a script to allow patients to engage in guided imagery.  Patients were to follow along to fully participate in the activity.  Education:  Stress Management, Discharge Planning.   Education Outcome: Acknowledges edcuation/In group clarification offered/Needs additional education  Clinical Observations/Feedback: Pt attended group.   Caroll RancherMarjette Markell Sciascia, LRT/CTRS         Lillia AbedLindsay, Dael Howland A 01/14/2017 11:40 AM

## 2017-01-14 NOTE — Progress Notes (Signed)
D:  Patient's self inventory sheet, patient sleeps good.  Good appetite, normal energy level.  Rated depression and hopeless #6, anxiety #7.  Denied withdrawals.  Denied SI.  Denied physical problems.  Denied physical pain.  Goal is "hope".  Plans "expression".  Does have discharge plans. A:  Medications administered per MD orders.  Emotional support and encouragement given patient. R:  Denied SI and HI, contracts for safety.  Denied A/V hallucinations.  Safety maintained with 15 minute checks.

## 2017-01-14 NOTE — Progress Notes (Signed)
  [facility] Discharge Plan :  Will you be returning to the same living situation after discharge:  Yes,  home At discharge, do you have transportation home?: Yes,  family Do you have the ability to pay for your medications: Yes,  mental health  Release of information consent forms completed and in the chart;  Patient's signature needed at discharge.  Patient to Follow up at: Follow-up Information    [facility] Follow up.   Specialty:  Behavioral Health Why:  Walk in within 7 days of hospital discharge to be seen for medication management. Walk in hours: Monday through Friday between 8am-9am. Thank you.  Contact information: [address] [place] [address] [phone]        [facility] Follow up on 01/17/2017.   Specialty:  Behavioral Health Why:  Appt for counseling on Thursday at 9:00AM with Theodoro Gristave. Thank you.  Contact information: [address] [place] [address] [phone]           Next level of care provider has access to [facility] Link:no  Safety Planning and Suicide Prevention discussed: Yes,  yes  Have you used any form of tobacco in the last 30 days? (Cigarettes, Smokeless Tobacco, Cigars, and/or Pipes): Yes  Has patient been referred to the Quitline?: Patient refused referral  Patient has been referred for addiction treatment: Yes  Lawrence [name] 01/14/2017, 11:45 AM

## 2017-01-14 NOTE — Discharge Summary (Signed)
Physician Discharge Summary Note  Patient:  Lawrence [name] is an 21 y.o., male MRN:  [id number] DOB:  [birth year] Patient phone:  [phone] (home)  Patient address:   [address] [place] [address],   Total Time spent with patient: Greater than 30 minutes  Date of Admission:  01/09/2017  Date of Discharge: 01-14-17  Reason for Admission: Suicidal threates  Principal Problem: Intermittent Explosive disorder  Discharge Diagnoses: Patient Active Problem List   Diagnosis Date Noted  . Schizoaffective disorder, bipolar type (HCC) [F25.0] 01/09/2017  . Intermittent explosive disorder [F63.81] 05/13/2016  . Cannabis use disorder, severe, dependence (HCC) [F12.20] 05/13/2016  . Substance induced mood disorder (HCC) [F19.94] 05/13/2016  . Intoxication by drug [facility](HCC) [I69.629][F19.929] 07/12/2014  . Intoxication [IMO0002] 07/12/2014  . Depression [F32.9] 07/12/2014  . Overdose of antitussive [T48.3X1A]    Past Psychiatric History: Intermittent Explosive disorder  Past Medical History:  Past Medical History:  Diagnosis Date  . ADD (attention deficit disorder)   . Asthma   . Depressed   . Schizoaffective disorder (HCC)    History reviewed. No pertinent surgical history. Family History: History reviewed. No pertinent family history.  Family Psychiatric  History: See H&P  Social History:  History  Alcohol Use No     History  Drug Use  . Types: Marijuana    Social History   Social History  . Marital status: Single    Spouse name: N/A  . Number of children: N/A  . Years of education: N/A   Social History Main Topics  . Smoking status: Current Some Day Smoker    Types: Cigars  . Smokeless tobacco: Never Used  . Alcohol use No  . Drug use: Yes    Types: Marijuana  . Sexual activity: No   Other Topics Concern  . None   Social History Narrative  . None   Hospital Course: 21 yo AAM, single, lives with his mom, unemployed. Presented for voluntary admission after  an  rgument with his mom. Reports suicidal thoughts at presentation. Reports feeling depressed. Has not been socializing. Not in any relationship. Not currently employed and stays to self all the time. Daily use of THC. 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 Has not used any lately. UDS tested positive for THC. Transient visual and auditory hallucinations in the past. No homicidal thoughts. Linked to [facility]. Has has various diagnosis over the years. Recently diagnosed as Schizoaffective. Has had trials of antidepressants and recently Trileptal.  Significant family history mental illness. Significant family history of suicide. No personal history of suicidal attempt.   After evaluation of his presenting symptoms, Lawrence LangCameron was started on medication regimen for his presenting symptoms. He was medicated & discharged on; Lexapro 10 mg for depression, Nicotine patch 14 mg for smoking cessation, Trileptal 300 mg for mood stabilization, Risperdal 1 mg for mood control & Trazodone 50 mg for insomnia. He was enrolled in the group counseling sessions being offered & held on this unit. He learned coping skills. Lawrence [name]'s symptoms responded well to his treatment regimen. This is evidenced by his reports of improved mood & absence of suicidal ideations. Part of his discharge plans is a follow-up appointment & referral to an outpatient clinic for further psychiatric care & medication management. He was provided with all the necessary information needed to make this appointment without problems.   Seen today. Reports that he is in good spirits. Not feeling depressed. Reports normal energy and interest. Has been maintaining normal biological functions. He is  able to think clearly. He is able to focus on task. His thoughts are not crowded or racing. No evidence of mania. No hallucination in any modality. He is not making any delusional statement. No passivity of will/thought. He is fully in touch with reality. No thoughts of  suicide. No thoughts of homicide. No violent thoughts. No overwhelming anxiety.  Nursing staff reports that patient has been appropriate on the unit. Patient has been interacting well with peers. No behavioral issues. Patient has not voiced any suicidal thoughts. Patient has not been observed to be internally stimulated. Patient has been adherent with treatment recommendations. Patient has been tolerating their medication well.   Patient was discussed at team. Team members feels that patient is back to his baseline level of function. Team agrees with plan to discharge patient today. Upon discharge, Lawrence [name] presents both mentally & medically stable. He left [facility] with all belongings in no apparent distress. He received a 7 days worth, supply samples of his [facility] discharge medications. Transportation per family.  Physical Findings: AIMS: Facial and Oral Movements Muscles of Facial Expression: None, normal Lips and Perioral Area: None, normal Jaw: None, normal Tongue: None, normal,Extremity Movements Upper (arms, wrists, hands, fingers): None, normal Lower (legs, knees, ankles, toes): None, normal, Trunk Movements Neck, shoulders, hips: None, normal, Overall Severity Severity of abnormal movements (highest score from questions above): None, normal Incapacitation due to abnormal movements: None, normal Patient's awareness of abnormal movements (rate only patient's report): No Awareness, Dental Status Current problems with teeth and/or dentures?: No Does patient usually wear dentures?: No  CIWA:  CIWA-Ar Total: 1 COWS:  COWS Total Score: 2  Musculoskeletal: Strength & Muscle Tone: within normal limits Gait & Station: normal Patient leans: N/A  Psychiatric Specialty Exam: Physical Exam  Constitutional: He appears well-developed.  HENT:  Head: Normocephalic.  Eyes: Pupils are equal, round, and reactive to light.  Cardiovascular: Normal rate.   Respiratory: Effort normal.  GI: Soft.   Genitourinary:  Genitourinary Comments: Deferred  Musculoskeletal: Normal range of motion.  Neurological: He is alert.  Skin: Skin is warm.    Review of Systems  Constitutional: Negative.   HENT: Negative.   Eyes: Negative.   Respiratory: Negative.   Cardiovascular: Negative.   Gastrointestinal: Negative.   Genitourinary: Negative.   Musculoskeletal: Negative.   Skin: Negative.   Neurological: Negative.   Endo/Heme/Allergies: Negative.   Psychiatric/Behavioral: Positive for depression (Stable) and substance abuse (Hx. Cannabis use disorder). Negative for hallucinations, memory loss and suicidal ideas. The patient has insomnia (Stable). The patient is not nervous/anxious.     Blood pressure 107/66, pulse 91, temperature 97.7 F (36.5 C), temperature source Oral, resp. rate 20, height 6\' 1"  (1.854 m), weight 81.9 kg (180 lb 8 oz), SpO2 100 %.Body mass index is 23.81 kg/m.  See Md's SRA   Have you used any form of tobacco in the last 30 days? (Cigarettes, Smokeless Tobacco, Cigars, and/or Pipes): Yes  Has this patient used any form of tobacco in the last 30 days? (Cigarettes, Smokeless Tobacco, Cigars, and/or Pipes): Yes, Nicorette gum prescription provided.  Blood Alcohol level:  Lab Results  Component Value Date   ETH <5 01/08/2017   ETH <5 05/12/2016   Metabolic Disorder Labs:  No results found for: HGBA1C, MPG No results found for: PROLACTIN No results found for: CHOL, TRIG, HDL, CHOLHDL, VLDL, LDLCALC  See Psychiatric Specialty Exam and Suicide Risk Assessment completed by Attending Physician prior to discharge.  Discharge destination:  Home  Is  patient on multiple antipsychotic therapies at discharge:  No   Has Patient had three or more failed trials of antipsychotic monotherapy by history:  No  Recommended Plan for Multiple Antipsychotic Therapies: NA  Allergies as of 01/14/2017      Reactions   Bee Venom Anaphylaxis   Apple    Corn-containing Products     Lac Bovis Itching   Milk Related   Other    Green Beans:  Other reaction(s): ASTHMA, RASH      Medication List    STOP taking these medications   EPINEPHrine 0.15 MG/0.3ML injection Commonly known as:  EPIPEN JR     TAKE these medications     Indication  alum & mag hydroxide-simeth 200-200-20 MG/5ML suspension Commonly known as:  MAALOX/MYLANTA Take 30 mLs by mouth every 4 (four) hours as needed for indigestion.  Indication:  Acid Indigestion   escitalopram 10 MG tablet Commonly known as:  LEXAPRO Take 1 tablet (10 mg total) by mouth daily. For depression Start taking on:  01/15/2017  Indication:  Major Depressive Disorder   magnesium hydroxide 400 MG/5ML suspension Commonly known as:  MILK OF MAGNESIA Take 30 mLs by mouth daily as needed for mild constipation.  Indication:  Constipation   nicotine 14 mg/24hr patch Commonly known as:  NICODERM CQ - dosed in mg/24 hours Place 1 patch (14 mg total) onto the skin daily. For smoking ceasation Start taking on:  01/15/2017  Indication:  Nicotine Addiction   Oxcarbazepine 300 MG tablet Commonly known as:  TRILEPTAL Take 1 tablet (300 mg total) by mouth 2 (two) times daily. For mood stabilization  Indication:  Mood stabilization   risperiDONE 1 MG tablet Commonly known as:  RISPERDAL Take 1 tablet (1 mg total) by mouth at bedtime. For mood control  Indication:  Mood control   traZODone 50 MG tablet Commonly known as:  DESYREL Take 1 tablet (50 mg) at bedtime: For sleep.  Indication:  Trouble Sleeping      Follow-up Information    [facility] Follow up.   Specialty:  Behavioral Health Why:  Walk in within 7 days of hospital discharge to be seen for medication management. Walk in hours: Monday through Friday between 8am-9am. Thank you.  Contact information: [address] [place] [address] [phone]        [facility] Follow up on 01/17/2017.   Specialty:  Behavioral Health Why:   Appt for counseling on Thursday at 9:00AM with Theodoro Grist. Thank you.  Contact information: [address] [place] [address] [phone]          Follow-up recommendations: Activity:  As tolerated Diet: As recommended by your primary care doctor. Keep all scheduled follow-up appointments as recommended.    Comments: Patient is instructed prior to discharge to: Take all medications as prescribed by his/her mental healthcare provider. Report any adverse effects and or reactions from the medicines to his/her outpatient provider promptly. Patient has been instructed & cautioned: To not engage in alcohol and or illegal drug use while on prescription medicines. In the event of worsening symptoms, patient is instructed to call the crisis hotline, 911 and or go to the nearest ED for appropriate evaluation and treatment of symptoms. To follow-up with his/her primary care provider for your other medical issues, concerns and or health care needs.   Signed: Sanjuana Kava, NP, PMHNP, FNP-BC 01/14/2017, 9:49 AM

## 2017-01-14 NOTE — [facility] Suicide Risk Assessment (Signed)
[facility]   Principal Problem: Substance induced mood disorder [facility](HCC) Discharge Diagnoses:  Patient Active Problem List   Diagnosis Date Noted  . Schizoaffective disorder, bipolar type (HCC) [F25.0] 01/09/2017  . Intermittent explosive disorder [F63.81] 05/13/2016  . Cannabis use disorder, severe, dependence (HCC) [F12.20] 05/13/2016  . Substance induced mood disorder (HCC) [F19.94] 05/13/2016  . Intoxication by drug [facility](HCC) [Z61.096][F19.929] 07/12/2014  . Intoxication [IMO0002] 07/12/2014  . Depression [F32.9] 07/12/2014  . Overdose of antitussive [T48.3X1A]     Total Time spent with patient: 45 minutes  Musculoskeletal: Strength & Muscle Tone: within normal limits Gait & Station: normal Patient leans: N/A  Psychiatric Specialty Exam: Review of Systems  Constitutional: Negative.   HENT: Negative.   Eyes: Negative.   Respiratory: Negative.   Cardiovascular: Negative.   Gastrointestinal: Negative.   Genitourinary: Negative.   Musculoskeletal: Negative.   Skin: Negative.   Neurological: Negative.   Endo/Heme/Allergies: Negative.   Psychiatric/Behavioral: Negative for depression, hallucinations, memory loss, substance abuse and suicidal ideas. The patient is not nervous/anxious and does not have insomnia.     Blood pressure 107/66, pulse 91, temperature 97.7 F (36.5 C), temperature source Oral, resp. rate 20, height 6\' 1"  (1.854 m), weight 81.9 kg (180 lb 8 oz), SpO2 100 %.Body mass index is 23.81 kg/m.  General Appearance: Neatly dressed, pleasant, engaging well and cooperative. Appropriate behavior. Not in any distress. Good relatedness. Not internally stimulated  Eye Contact::  Good  Speech:  Spontaneous, normal prosody. Normal tone and rate.   Volume:  Normal  Mood:  Euthymic  Affect:  Appropriate and Full Range  Thought Process:  Goal Directed and Linear  Orientation:  Full (Time, Place, and Person)  Thought Content: future oriented. No delusional  theme. No preoccupation with violent thoughts. No negative ruminations. No obsession.  No hallucination in any modality.   Suicidal Thoughts:  No  Homicidal Thoughts:  No  Memory:  Immediate;   Good Recent;   Good Remote;   Good  Judgement:  Good  Insight:  Good  Psychomotor Activity:  Normal  Concentration:  Good  Recall:  Good  Fund of Knowledge:Good  Language: Good  Akathisia:  Negative  Handed:    AIMS (if indicated):     Assets:  Communication Skills Desire for Improvement Financial Resources/Insurance Housing Physical Health Resilience Vocational/Educational  Sleep:  Number of Hours: 6  Cognition: WNL  ADL's:  Intact   Clinical Assessment::   21 yo AAM, single, lives with his mom, unemployed. Presented for voluntary admission after an argument with his mom. Reports suicidal thoughts at presentation. Reports feeling depressed. Has not been socializing. Not in any relationship. Not currently employed and stays to self all the time. Daily use of THC. 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 Has not used any lately. UDS tested positive for THC. Transient visual and auditory hallucinations in the past. No homicidal thoughts. Linked to [facility]. Has has various diagnosis over the years. Recently diagnosed as Schizoaffective. Has had trials of antidepressants and recently Trileptal.  Significant family history mental illness. Significant family history of suicide. No personal history of suicidal attempt.   Seen today. Reports that he is in good spirits. Not feeling depressed. Reports normal energy and interest. Has been maintaining normal biological functions. He is able to think clearly. He is able to focus on task. His thoughts are not crowded or racing. No evidence of mania. No hallucination in any modality. He is not making any delusional statement. No  passivity of will/thought. He is fully in touch with reality. No thoughts of suicide. No thoughts of homicide. No violent thoughts. No  overwhelming anxiety.  Nursing staff reports that patient has been appropriate on the unit. Patient has been interacting well with peers. No behavioral issues. Patient has not voiced any suicidal thoughts. Patient has not been observed to be internally stimulated. Patient has been adherent with treatment recommendations. Patient has been tolerating their medication well.   Patient was discussed at team. Team members feels that patient is back to his baseline level of function. Team agrees with plan to discharge patient today.   Demographic Factors:  Unemployed  Loss Factors: NA  Historical Factors: Impulsivity  Risk Reduction Factors:   Sense of responsibility to family, Living with another person, especially a relative, Positive social support, Positive therapeutic relationship and Positive coping skills or problem solving skills  Continued Clinical Symptoms:  As above  Cognitive Features That Contribute To Risk:  None    Suicide Risk:  Minimal: No identifiable suicidal ideation. Patient is not having any thoughts of suicide at this time. Modifiable risk factors targeted during this admission includes depression, psychosis and substance use. Demographical and historical risk factors cannot be modified. Patient is now engaging well. Patient is reliable and is future oriented. We have buffered patient's support structures. At this point, patient is at low risk of suicide. Patient is aware of the effects of psychoactive substances on decision making process. Patient has been provided with emergency contacts. Patient acknowledges to use resources provided if unforseen circumstances changes their current risk stratification.   Follow-up Information    [facility] Follow up.   Specialty:  Behavioral Health Why:  Walk in within 7 days of hospital discharge to be seen for medication management. Walk in hours: Monday through Friday between 8am-9am. Thank you.  Contact information: [address] [place] [address] [phone]        [facility] Follow up on 01/17/2017.   Specialty:  Behavioral Health Why:  Appt for counseling on Thursday at 9:00AM with Theodoro Grist. Thank you.  Contact information: [address] [place] [address] [phone]           Plan Of Care/Follow-up recommendations:  1. Continue current psychotropic medications 2. Mental health and addiction follow up as arranged.  3. Discharge in care of his family 4. Provided limited quantity of prescriptions  Georgiann Cocker, MD 01/14/2017, 9:53 AM

## 2017-01-14 NOTE — Progress Notes (Signed)
Patient has been up and active on the unit, attended group this evening and participated.  He did not attend AA meeting but did attend wrap up group with the mht. Patient reports that things are better with his mother and he has spoken to her over the phone. He took his scheduled medication for hs. Support and encouragement offered, safety maintained on unit, will continue to monitor.

## 2017-01-14 NOTE — Tx Team (Signed)
Interdisciplinary Treatment and Diagnostic Plan Update  01/14/2017 Time of Session: 0930 Lawrence [name] MRN: [id number]  Principal Diagnosis: Substance induced mood disorder (HCC)  Secondary Diagnoses: Principal Problem:   Substance induced mood disorder (HCC) Active Problems:   Schizoaffective disorder, bipolar type (HCC)   Current Medications:  Current Facility-Administered Medications  Medication Dose Route Frequency Provider Last Rate Last Dose  . alum & mag hydroxide-simeth (MAALOX/MYLANTA) 200-200-20 MG/5ML suspension 30 mL  30 mL Oral Q4H PRN Charm RingsLord, Jamison Y, NP      . escitalopram (LEXAPRO) tablet 10 mg  10 mg Oral Daily Charm RingsLord, Jamison Y, NP   10 mg at 01/14/17 0725  . magnesium hydroxide (MILK OF MAGNESIA) suspension 30 mL  30 mL Oral Daily PRN Charm RingsLord, Jamison Y, NP      . nicotine (NICODERM CQ - dosed in mg/24 hours) patch 14 mg  14 mg Transdermal Daily Cobos, Rockey SituFernando A, MD      . Oxcarbazepine (TRILEPTAL) tablet 300 mg  300 mg Oral BID Charm RingsLord, Jamison Y, NP   300 mg at 01/14/17 0725  . risperiDONE (RISPERDAL) tablet 1 mg  1 mg Oral QHS Izediuno, Delight OvensVincent A, MD   1 mg at 01/13/17 2140  . traZODone (DESYREL) tablet 50 mg  50 mg Oral QHS,MR X 1 Nira ConnBerry, Jason A, NP   50 mg at 01/13/17 2141   PTA Medications: Prescriptions Prior to Admission  Medication Sig Dispense Refill Last Dose  . EPINEPHrine (EPIPEN JR) 0.15 MG/0.3ML injection Inject 0.15 mg into the muscle as needed for anaphylaxis.    not needed  . escitalopram (LEXAPRO) 10 MG tablet Take 1 tablet (10 mg total) by mouth daily. For depression 30 tablet 0 01/08/2017 at 0700  . Oxcarbazepine (TRILEPTAL) 300 MG tablet Take 1 tablet (300 mg total) by mouth 2 (two) times daily. For mood stabilization 60 tablet 0 01/08/2017 at 0700  . [DISCONTINUED] alum & mag hydroxide-simeth (MAALOX/MYLANTA) 200-200-20 MG/5ML suspension Take 30 mLs by mouth every 4 (four) hours as needed for indigestion. 355 mL 0   . [DISCONTINUED] magnesium hydroxide  (MILK OF MAGNESIA) 400 MG/5ML suspension Take 30 mLs by mouth daily as needed for mild constipation. 360 mL 0     Patient Stressors: Educational concerns Occupational concerns Substance abuse  Patient Strengths: Ability for insight Average or above average intelligence Communication skills Supportive family/friends  Treatment Modalities: Medication Management, Group therapy, Case management,  1 to 1 session with clinician, Psychoeducation, Recreational therapy.   Physician Treatment Plan for Primary Diagnosis: Substance induced mood disorder (HCC) Long Term Goal(s): Improvement in symptoms so as ready for discharge Improvement in symptoms so as ready for discharge   Short Term Goals: Ability to identify changes in lifestyle to reduce recurrence of condition will improve Ability to verbalize feelings will improve Ability to disclose and discuss suicidal ideas Ability to demonstrate self-control will improve Ability to identify and develop effective coping behaviors will improve Compliance with prescribed medications will improve Ability to identify triggers associated with substance abuse/mental health issues will improve  Medication Management: Evaluate patient's response, side effects, and tolerance of medication regimen.  Therapeutic Interventions: 1 to 1 sessions, Unit Group sessions and Medication administration.  Evaluation of Outcomes: Adequate for Discharge  Physician Treatment Plan for Secondary Diagnosis: Principal Problem:   Substance induced mood disorder (HCC) Active Problems:   Schizoaffective disorder, bipolar type (HCC)  Long Term Goal(s): Improvement in symptoms so as ready for discharge Improvement in symptoms so as ready for discharge  Short Term Goals: Ability to identify changes in lifestyle to reduce recurrence of condition will improve Ability to verbalize feelings will improve Ability to disclose and discuss suicidal ideas Ability to demonstrate  self-control will improve Ability to identify and develop effective coping behaviors will improve Compliance with prescribed medications will improve Ability to identify triggers associated with substance abuse/mental health issues will improve     Medication Management: Evaluate patient's response, side effects, and tolerance of medication regimen.  Therapeutic Interventions: 1 to 1 sessions, Unit Group sessions and Medication administration.  Evaluation of Outcomes: Adequate for Discharge   RN Treatment Plan for Primary Diagnosis: Substance induced mood disorder (HCC) Long Term Goal(s): Knowledge of disease and therapeutic regimen to maintain health will improve  Short Term Goals: Ability to remain free from injury will improve, Ability to verbalize feelings will improve and Ability to disclose and discuss suicidal ideas  Medication Management: RN will administer medications as ordered by provider, will assess and evaluate patient's response and provide education to patient for prescribed medication. RN will report any adverse and/or side effects to prescribing provider.  Therapeutic Interventions: 1 on 1 counseling sessions, Psychoeducation, Medication administration, Evaluate responses to treatment, Monitor vital signs and CBGs as ordered, Perform/monitor CIWA, COWS, AIMS and Fall Risk screenings as ordered, Perform wound care treatments as ordered.  Evaluation of Outcomes: Adequate for Discharge   LCSW Treatment Plan for Primary Diagnosis: Substance induced mood disorder (HCC) Long Term Goal(s): Safe transition to appropriate next level of care at discharge, Engage patient in therapeutic group addressing interpersonal concerns.  Short Term Goals: Engage patient in aftercare planning with referrals and resources, Facilitate patient progression through stages of change regarding substance use diagnoses and concerns and Identify triggers associated with mental health/substance abuse  issues  Therapeutic Interventions: Assess for all discharge needs, 1 to 1 time with Social worker, Explore available resources and support systems, Assess for adequacy in community support network, Educate family and significant other(s) on suicide prevention, Complete Psychosocial Assessment, Interpersonal group therapy.  Evaluation of Outcomes: Adequate for Discharge   Progress in Treatment: Attending groups: Yes. Participating in groups: Yes. Taking medication as prescribed: Yes. Toleration medication: Yes. Family/Significant other contact made: Yes Patient understands diagnosis: Yes. Discussing patient identified problems/goals with staff: Yes. Medical problems stabilized or resolved: Yes. Denies suicidal/homicidal ideation: Yes. Issues/concerns per patient self-inventory: No. Other: n/a   New problem(s) identified: No, Describe:  n/a  New Short Term/Long Term Goal(s): detox; medication stabilization; elimination of AVH and SI thoughts; mood stabilization; development of comprehensive mental wellness/sobriety plan.   Discharge Plan or Barriers: CSW assessing for appropriate referrals. Pt goes to [facility] for medication management and [facility]. Pt given [facility] pamphlet/information as well.  Reason for Continuation of Hospitalization:   Estimated Length of Stay: D/C today   Attendees: Patient: 01/14/2017 11:43 AM  Physician: Dr. Jackquline Berlin MD 01/14/2017 11:43 AM  Nursing: Peterson Ao RN 01/14/2017 11:43 AM  RN Care Manager: Onnie Boer CM 01/14/2017 11:43 AM  Social Worker: Trula Slade, LCSW; Donnelly Stager LCSWA 01/14/2017 11:43 AM  Recreational Therapist: x 01/14/2017 11:43 AM  Other: Armandina Stammer NP; May Augustin NP 01/14/2017 11:43 AM  Other:  01/14/2017 11:43 AM  Other: 01/14/2017 11:43 AM    Scribe for Treatment Team: Ida Rogue, LCSW 01/14/2017 11:43 AM

## 2017-04-16 ENCOUNTER — Telehealth: Payer: Self-pay | Admitting: *Deleted

## 2017-04-16 NOTE — Telephone Encounter (Signed)
Received request for Medical records from [facility] Disability Determination Services; forwarded to [place] for email/scan/SLS

## 2017-05-06 ENCOUNTER — Telehealth: Payer: Self-pay | Admitting: *Deleted

## 2017-05-06 NOTE — Telephone Encounter (Signed)
Received request for Medical records from [facility] Disability Determination Services, forwarded to [place] for email/scan/SLS 09/17

## 2017-05-26 ENCOUNTER — Encounter (HOSPITAL_COMMUNITY): Payer: Self-pay | Admitting: Behavioral Health

## 2017-05-26 ENCOUNTER — Encounter (HOSPITAL_COMMUNITY): Payer: Self-pay | Admitting: General Practice

## 2017-05-26 ENCOUNTER — Ambulatory Visit (HOSPITAL_COMMUNITY)
Admission: RE | Admit: 2017-05-26 | Discharge: 2017-05-26 | Disposition: A | Discharge status: Home / Self Care | Payer: PRIVATE HEALTH INSURANCE | Source: Home / Self Care | Attending: Psychiatry | Admitting: Psychiatry

## 2017-05-26 ENCOUNTER — Emergency Department (HOSPITAL_COMMUNITY)
Admission: EM | Admit: 2017-05-26 | Discharge: 2017-05-27 | Disposition: A | Discharge status: Home / Self Care | Payer: PRIVATE HEALTH INSURANCE | Attending: Emergency Medicine | Admitting: Emergency Medicine

## 2017-05-26 DIAGNOSIS — F329 Major depressive disorder, single episode, unspecified: Secondary | ICD-10-CM | POA: Diagnosis not present

## 2017-05-26 DIAGNOSIS — F909 Attention-deficit hyperactivity disorder, unspecified type: Secondary | ICD-10-CM | POA: Insufficient documentation

## 2017-05-26 DIAGNOSIS — R45851 Suicidal ideations: Secondary | ICD-10-CM | POA: Diagnosis not present

## 2017-05-26 DIAGNOSIS — Z79899 Other long term (current) drug therapy: Secondary | ICD-10-CM | POA: Insufficient documentation

## 2017-05-26 DIAGNOSIS — F1210 Cannabis abuse, uncomplicated: Secondary | ICD-10-CM | POA: Diagnosis not present

## 2017-05-26 DIAGNOSIS — F17210 Nicotine dependence, cigarettes, uncomplicated: Secondary | ICD-10-CM | POA: Insufficient documentation

## 2017-05-26 DIAGNOSIS — J45909 Unspecified asthma, uncomplicated: Secondary | ICD-10-CM | POA: Diagnosis not present

## 2017-05-26 DIAGNOSIS — F1994 Other psychoactive substance use, unspecified with psychoactive substance-induced mood disorder: Secondary | ICD-10-CM

## 2017-05-26 DIAGNOSIS — F1220 Cannabis dependence, uncomplicated: Secondary | ICD-10-CM | POA: Diagnosis present

## 2017-05-26 LAB — COMPREHENSIVE METABOLIC PANEL
ALBUMIN: 4.6 g/dL (ref 3.5–5.0)
ALT: 33 U/L (ref 17–63)
AST: 43 U/L — AB (ref 15–41)
Alkaline Phosphatase: 71 U/L (ref 38–126)
Anion gap: 7 (ref 5–15)
BUN: 11 mg/dL (ref 6–20)
CHLORIDE: 106 mmol/L (ref 101–111)
CO2: 27 mmol/L (ref 22–32)
CREATININE: 1.2 mg/dL (ref 0.61–1.24)
Calcium: 9.4 mg/dL (ref 8.9–10.3)
GFR calc Af Amer: >60 mL/min (ref >60)
GFR calc non Af Amer: >60 mL/min (ref >60)
Glucose, Bld: 121 mg/dL — ABNORMAL HIGH (ref 65–99)
POTASSIUM: 4.1 mmol/L (ref 3.5–5.1)
SODIUM: 140 mmol/L (ref 135–145)
Total Bilirubin: 0.6 mg/dL (ref 0.3–1.2)
Total Protein: 7.5 g/dL (ref 6.5–8.1)

## 2017-05-26 LAB — RAPID URINE DRUG SCREEN, HOSP PERFORMED
AMPHETAMINES: NOT DETECTED
BENZODIAZEPINES: NOT DETECTED
Barbiturates: NOT DETECTED
Cocaine: NOT DETECTED
OPIATES: NOT DETECTED
TETRAHYDROCANNABINOL: POSITIVE — AB

## 2017-05-26 LAB — CBC
HCT: 43.3 % (ref 39.0–52.0)
HEMOGLOBIN: 14.6 g/dL (ref 13.0–17.0)
MCH: 30.7 pg (ref 26.0–34.0)
MCHC: 33.7 g/dL (ref 30.0–36.0)
MCV: 91 fL (ref 78.0–100.0)
PLATELETS: 208 10*3/uL (ref 150–400)
RBC: 4.76 MIL/uL (ref 4.22–5.81)
RDW: 13.2 % (ref 11.5–15.5)
WBC: 10.7 10*3/uL — AB (ref 4.0–10.5)

## 2017-05-26 LAB — SALICYLATE LEVEL: Salicylate Lvl: <7 mg/dL (ref 2.8–30.0)

## 2017-05-26 LAB — ACETAMINOPHEN LEVEL: Acetaminophen (Tylenol), Serum: <10 ug/mL — ABNORMAL LOW (ref 10–30)

## 2017-05-26 LAB — ETHANOL: Alcohol, Ethyl (B): <10 mg/dL (ref <10)

## 2017-05-26 NOTE — BH Assessment (Signed)
[facility] Assessment Progress Note   Pt has been faxed to the following inpt facilities for possible admission:  [facility], [place], [address], [place], [place], [place], [place], [facility], [place].  Princess Bruins, MSW, LCSW Therapeutic Triage Specialist  [phone]

## 2017-05-26 NOTE — BH Assessment (Signed)
Assessment Note  Lawrence [name] is an 21 y.o. male who presented to [facility] as a voluntary walk-in with complaint of significant suicidal ideation.  Pt was last assessed by TTS in May 2018.  At that time, he had suicidal ideation and was treated inpatient.  Per history, Pt has a history of Schizoaffective Disorder.  Pt provided history.  Pt stated that he lives with his grandmother and is currently unemployed.  "For no real reason," Pt is experiencing significant suicidal ideation.  "This is my last time coming here for help.  The next time, I hope I have the guts just to kill myself."  Pt currently does not have a plan for suicide attempt -- ''I wish I had a gun.''  Pt expressed preoccupation with death.  When asked if he had auditory/visual hallucinations, he stated, "Just death."  When asked if he was hopeless, he stated, "I hope for death."  Pt endorsed suicidal ideation, despondency, worthlessness, and isolation.  Pt also endorsed ongoing use of marijuana (about 2x a day) and weekly use of alcohol.  Pt denied homicidal ideation, auditory/visual hallucination, and current self-injurious behavior (a history of cutting).    Pt indicated that he is prescribed numerous medications, but he cannot recall their names.  Likewise, he cannot recall the names of his current therapist and psychiatrist.  Pt used to receive outpatient services from [facility] but he stated he is no longer with them.  Pt is currently facing several vehicular Breaking and Entering charges, with a court date later in October.  During assessment, Pt presented as alert and oriented.  He had good eye contact and was cooperative in session. He was dressed in street clothes and was well-groomed.  However, he had body odor.  Pt's demeanor was restless.  Pt's mood was sad, preoccupied (with death), and irritable/impatient.  Affect was mood-congruent.  Pt endorsed suicidal ideation, other depressive symptoms, and substance use.  Pt's speech was normal  in rate, rhythm, and volume.  Pt's thought processes were within normal range.  Thought content was logical and goal-oriented.  There was no evidence of delusion.  Pt's memory and concentration were intact.  Insight and judgment were poor.  Impulse control was fair.  Consulted with Julian Hy, NP who recommended inpatient placement.  [facility] contacted.  [facility] contacted.  Diagnosis: Schizoaffective Disorder; Cannabis Use Disorder  Past Medical History:  Past Medical History:  Diagnosis Date  . ADD (attention deficit disorder)   . Asthma   . Depressed   . Schizoaffective disorder (HCC)     No past surgical history on file.  Family History: No family history on file.  Social History:  reports that he has been smoking Cigars.  He has never used smokeless tobacco. He reports that he drinks alcohol. He reports that he uses drugs, including Marijuana, about 14 times per week.  Additional Social History:  Alcohol / Drug Use Pain Medications: See MAR Prescriptions: See MAR Over the Counter: See MAR History of alcohol / drug use?: Yes Substance #1 Name of Substance 1: Marijuana 1 - Age of First Use: Teenager 1 - Amount (size/oz): varied 1 - Frequency: 2x day 1 - Duration: Ongoing 1 - Last Use / Amount: 05/25/17 per report Substance #2 Name of Substance 2: Alcohol 2 - Age of First Use: teens 2 - Amount (size/oz): varied 2 - Frequency: weekly 2 - Duration: ongoing 2 - Last Use / Amount: 05/20/17  CIWA: CIWA-Ar BP: 120/70 Pulse Rate: 64 COWS:  Allergies:  Allergies  Allergen Reactions  . Bee Venom Anaphylaxis  . Apple   . Corn-Containing Products   . Lac Bovis Itching    Milk Related  . Other     Green Beans:   Other reaction(s): ASTHMA, RASH    Home Medications:  (Not in a hospital admission)  OB/GYN Status:  No LMP for male patient.  General Assessment Data Location of Assessment: [facility] Assessment Services TTS Assessment: In system Is this a Tele or  Face-to-Face Assessment?: Face-to-Face Is this an Initial Assessment or a Re-assessment for this encounter?: Initial Assessment Marital status: Single Is patient pregnant?: No Pregnancy Status: No Living Arrangements: Other relatives (Lives with grandparent) Can pt return to current living arrangement?: Yes Admission Status: Voluntary Is patient capable of signing voluntary admission?: Yes Referral Source: Self/Family/Friend Insurance type: SP  Medical Screening Exam [facility] Walk-in ONLY) Medical Exam completed: Yes  Crisis Care Plan Living Arrangements: Other relatives (Lives with grandparent) Name of Psychiatrist: Cannot remember name Name of Therapist: Cannot remember name  Education Status Is patient currently in school?: No Highest grade of school patient has completed: 12  Risk to self with the past 6 months Suicidal Ideation: Yes-Currently Present Has patient been a risk to self within the past 6 months prior to admission? : Yes Suicidal Intent: Yes-Currently Present Has patient had any suicidal intent within the past 6 months prior to admission? : Yes Is patient at risk for suicide?: Yes Suicidal Plan?: No Has patient had any suicidal plan within the past 6 months prior to admission? : No Access to Means: No What has been your use of drugs/alcohol within the last 12 months?: Marijuana, alcohol Previous Attempts/Gestures: No Intentional Self Injurious Behavior: None (Hx of self-injury, but none recently) Family Suicide History: Unknown Recent stressful life event(s):  (Could not identify) Persecutory voices/beliefs?: No Depression: Yes Depression Symptoms: Despondent, Insomnia, Isolating, Feeling worthless/self pity, Loss of interest in usual pleasures Substance abuse history and/or treatment for substance abuse?: Yes Suicide prevention information given to non-admitted patients: Not applicable  Risk to Others within the past 6 months Homicidal Ideation: No Does  patient have any lifetime risk of violence toward others beyond the six months prior to admission? : No Thoughts of Harm to Others: No Current Homicidal Intent: No Current Homicidal Plan: No Access to Homicidal Means: No History of harm to others?: Yes Assessment of Violence: In past 6-12 months Violent Behavior Description: Physical aggression toward mother Does patient have access to weapons?: No Criminal Charges Pending?: Yes Describe Pending Criminal Charges: Felony B&E Does patient have a court date: Yes Court Date: 06/07/17 Is patient on probation?: Unknown  Psychosis Hallucinations: None noted  Mental Status Report Appearance/Hygiene: Body odor, Other (Comment), Unremarkable (Street clohtes) Eye Contact: Good Motor Activity: Freedom of movement, Unremarkable Speech: Tangential Level of Consciousness: Alert Mood: Preoccupied, Depressed, Irritable Affect: Appropriate to circumstance Anxiety Level: None Thought Processes: Circumstantial Judgement: Impaired Orientation: Person, Place, Time, Situation Obsessive Compulsive Thoughts/Behaviors: None  Cognitive Functioning Concentration: Normal Memory: Recent Intact, Remote Intact IQ: Average Insight: Poor Impulse Control: Fair Appetite: Good Sleep: No Change Vegetative Symptoms: None  ADLScreening [facility] Assessment Services) Patient's cognitive ability adequate to safely complete daily activities?: Yes Patient able to express need for assistance with ADLs?: Yes Independently performs ADLs?: Yes (appropriate for developmental age)  Prior Inpatient Therapy Prior Inpatient Therapy: Yes Prior Therapy Dates: May 2018, 2017 Prior Therapy Facilty/Provider(s): [facility] Reason for Treatment: Schizoaffective Disorder  Prior Outpatient Therapy Prior Outpatient Therapy: Yes  Prior Therapy Dates: 2017, 2018 Prior Therapy Facilty/Provider(s): [facility], [place] Reason for Treatment: Schizoaffective Disorder Does patient have an ACCT  team?: No Does patient have Intensive In-House Services?  : No Does patient have [facility] services? : No Does patient have P4CC services?: No  ADL Screening (condition at time of admission) Patient's cognitive ability adequate to safely complete daily activities?: Yes Is the patient deaf or have difficulty hearing?: No Does the patient have difficulty seeing, even when wearing glasses/contacts?: No Does the patient have difficulty concentrating, remembering, or making decisions?: No Patient able to express need for assistance with ADLs?: Yes Does the patient have difficulty dressing or bathing?: No Independently performs ADLs?: Yes (appropriate for developmental age) Does the patient have difficulty walking or climbing stairs?: No Weakness of Legs: None Weakness of Arms/Hands: None  Home Assistive Devices/Equipment Home Assistive Devices/Equipment: None  Therapy Consults (therapy consults require a physician order) PT Evaluation Needed: No OT Evalulation Needed: No SLP Evaluation Needed: No Abuse/Neglect Assessment (Assessment to be complete while patient is alone) Physical Abuse: Denies Verbal Abuse: Yes, past (Comment) Sexual Abuse: Denies Exploitation of patient/patient's resources: Denies Self-Neglect: Denies Values / Beliefs Cultural Requests During Hospitalization: None Spiritual Requests During Hospitalization: None Consults Spiritual Care Consult Needed: No Social Work Consult Needed: No Merchant navy officer (For Healthcare) Does Patient Have a Medical Advance Directive?: No    Additional Information 1:1 In Past 12 Months?: No CIRT Risk: No Elopement Risk: No Does patient have medical clearance?: Yes     Disposition:  Disposition Initial Assessment Completed for this Encounter: Yes Disposition of Patient: Inpatient treatment program Type of inpatient treatment program: Adult (Per Julian Hy, NP, Pt meets inpt criteria)  On Site Evaluation by:   Reviewed  with Physician:    Dorris Fetch Darolyn Double 05/26/2017 3:27 PM

## 2017-05-26 NOTE — Progress Notes (Signed)
TTS received a phone call from Theo with [facility] to report the pt is under review for possible placement.   Princess Bruins, MSW, LCSW Therapeutic Triage Specialist  [phone]

## 2017-05-26 NOTE — H&P (Signed)
Behavioral Health Medical Screening Exam  Lawrence [name] is an 21 y.o. male who arrived voluntarily to [facility] appearing bizzare with poor eye contact. Patient is here with c/o active suicide ideations, no specific plan. He has been hospitalized here multiple times in the past. He currently denies HI/AH but endorsing visual hallucinations of seeing things. Patient stating that this will be his last time to seek help with suicide ideations. Stating that the next time, he will just go ahead and kill himself. Patient with poor eye contact, and got irritated and offended when this writer inquired about his family. He said in anger tone, "don't talk about my family". .  Total Time spent with patient: 30 minutes  Psychiatric Specialty Exam: Physical Exam  Vitals reviewed. Constitutional: He is oriented to person, place, and time. He appears well-developed and well-nourished.  HENT:  Head: Normocephalic and atraumatic.  Eyes: Pupils are equal, round, and reactive to light.  Neck: Normal range of motion.  Cardiovascular: Normal rate, regular rhythm and normal heart sounds.   Respiratory: Effort normal and breath sounds normal.  GI: Soft. Bowel sounds are normal.  Musculoskeletal: Normal range of motion.  Neurological: He is alert and oriented to person, place, and time.  Skin: Skin is warm and dry.    Review of Systems  Psychiatric/Behavioral: Positive for depression, hallucinations, substance abuse and suicidal ideas. Negative for memory loss. The patient is nervous/anxious and has insomnia.   All other systems reviewed and are negative.   Blood pressure 120/70, pulse 64, temperature 98.5 F (36.9 C), temperature source Oral, resp. rate 16, SpO2 99 %.There is no height or weight on file to calculate BMI.  General Appearance: Casual  Eye Contact:  Poor  Speech:  Clear and Coherent and Normal Rate  Volume:  Normal  Mood:  Angry, Anxious, Depressed and Irritable  Affect:  Congruent and Depressed   Thought Process:  Coherent and Goal Directed  Orientation:  Full (Time, Place, and Person)  Thought Content:  WDL and Logical  Suicidal Thoughts:  Yes.  with intent/plan  Homicidal Thoughts:  No  Memory:  Immediate;   Good Recent;   Good Remote;   Fair  Judgement:  Fair  Insight:  Shallow  Psychomotor Activity:  Normal  Concentration: Concentration: Good and Attention Span: Fair  Recall:  Good  Fund of Knowledge:Good  Language: Good  Akathisia:  Negative  Handed:  Right  AIMS (if indicated):     Assets:  Communication Skills Desire for Improvement Financial Resources/Insurance Housing Leisure Time Physical Health Resilience  Sleep:       Musculoskeletal: Strength & Muscle Tone: within normal limits Gait & Station: normal Patient leans: N/A  Blood pressure 120/70, pulse 64, temperature 98.5 F (36.9 C), temperature source Oral, resp. rate 16, SpO2 99 %.  Recommendations:  Based on my evaluation the patient appears to have an emergency medical condition for which I recommend the patient be transferred to the emergency department for further evaluation.  Delila Pereyra, NP 05/26/2017, 3:13 PM

## 2017-05-26 NOTE — ED Triage Notes (Signed)
Pt drove himself to the emergency department.  Positive for suicidal thoughts. Does not have an active plan, but stated "I wanted to come to the ED for help one last time.  I feel like I am fighting a battle that I can't win, and the next time I feel this way, I won't seek or ask for help".  Pt expressed that his entire life has been a crisis.  York Spaniel he has positive thoughts of suicide to "hurt certain people" and "open their eyes".  Pt said he wants to take his life to hurt certain people in his life.  Pt has a Veterinary surgeon at [facility] and stated he has been treated as an inpatient for the same symptoms in the past.

## 2017-05-26 NOTE — Progress Notes (Signed)
Mother on bed side .

## 2017-05-27 DIAGNOSIS — R45851 Suicidal ideations: Secondary | ICD-10-CM | POA: Diagnosis not present

## 2017-05-27 DIAGNOSIS — F17210 Nicotine dependence, cigarettes, uncomplicated: Secondary | ICD-10-CM

## 2017-05-27 DIAGNOSIS — F1210 Cannabis abuse, uncomplicated: Secondary | ICD-10-CM | POA: Diagnosis not present

## 2017-05-27 DIAGNOSIS — F1994 Other psychoactive substance use, unspecified with psychoactive substance-induced mood disorder: Secondary | ICD-10-CM | POA: Diagnosis not present

## 2017-05-27 NOTE — ED Provider Notes (Signed)
[facility]-EMERGENCY DEPT Provider Note   CSN: [id number] Arrival date & time: 05/26/17  1523     History   Chief Complaint Chief Complaint  Patient presents with  . Suicidal    HPI Lawrence [name] is a 21 y.o. male.   Mental Health Problem  Presenting symptoms: depression and suicidal thoughts   Presenting symptoms: no suicidal threats and no suicide attempt   Degree of incapacity (severity):  Mild Onset quality:  Gradual Duration:  2 months Timing:  Constant Progression:  Worsening Chronicity:  New Context: not alcohol use and not drug abuse     Past Medical History:  Diagnosis Date  . ADD (attention deficit disorder)   . Asthma   . Depressed   . Schizoaffective disorder [facility])     Patient Active Problem List   Diagnosis Date Noted  . Schizoaffective disorder, bipolar type (HCC) 01/09/2017  . Intermittent explosive disorder 05/13/2016  . Cannabis use disorder, severe, dependence (HCC) 05/13/2016  . Substance induced mood disorder (HCC) 05/13/2016  . Intoxication by drug (HCC) 07/12/2014  . Intoxication 07/12/2014  . Depression 07/12/2014  . Overdose of antitussive     No past surgical history on file.     Home Medications    Prior to Admission medications   Medication Sig Start Date End Date Taking? Authorizing Provider  escitalopram (LEXAPRO) 10 MG tablet Take 1 tablet (10 mg total) by mouth daily. For depression 01/15/17  Yes Nwoko, Nicole Kindred I, NP  Oxcarbazepine (TRILEPTAL) 300 MG tablet Take 1 tablet (300 mg total) by mouth 2 (two) times daily. For mood stabilization 01/14/17  Yes Nwoko, Nicole Kindred I, NP  risperiDONE (RISPERDAL) 1 MG tablet Take 1 tablet (1 mg total) by mouth at bedtime. For mood control 01/14/17  Yes Armandina Stammer I, NP  tetrahydrozoline 0.05 % ophthalmic solution Place 2 drops into both eyes as needed (redness).   Yes [provider]  traZODone (DESYREL) 50 MG tablet Take 1 tablet (50 mg) at bedtime: For sleep. 01/14/17  Yes Nwoko, Nicole Kindred I,  NP  VENTOLIN HFA 108 (90 Base) MCG/ACT inhaler Take 2 puffs by mouth as needed for shortness of breath. 05/23/17  Yes [provider]  alum & mag hydroxide-simeth (MAALOX/MYLANTA) 200-200-20 MG/5ML suspension Take 30 mLs by mouth every 4 (four) hours as needed for indigestion. Patient not taking: Reported on 05/26/2017 01/14/17   Armandina Stammer I, NP  magnesium hydroxide (MILK OF MAGNESIA) 400 MG/5ML suspension Take 30 mLs by mouth daily as needed for mild constipation. Patient not taking: Reported on 05/26/2017 01/14/17   Armandina Stammer I, NP  nicotine (NICODERM CQ - DOSED IN MG/24 HOURS) 14 mg/24hr patch Place 1 patch (14 mg total) onto the skin daily. For smoking ceasation Patient not taking: Reported on 05/26/2017 01/15/17   Sanjuana Kava, NP    Family History History reviewed. No pertinent family history.  Social History Social History  Substance Use Topics  . Smoking status: Current Some Day Smoker    Types: Cigars  . Smokeless tobacco: Never Used  . Alcohol use Yes     Comment: Last use was last week     Allergies   Bee venom; Apple; Corn-containing products; Lac bovis; and Other   Review of Systems Review of Systems  Psychiatric/Behavioral: Positive for suicidal ideas.  All other systems reviewed and are negative.    Physical Exam Updated Vital Signs BP 121/88 (BP Location: Left Arm)   Pulse 60   Temp 98.3 F (36.8 C) (Oral)  Resp 18   Ht  (1.854 m)   Wt 82.1 kg (181 lb)   SpO2 100%   BMI 23.88 kg/m   Physical Exam  Constitutional: He appears well-developed and well-nourished.  HENT:  Head: Normocephalic and atraumatic.  Neck: Normal range of motion.  Cardiovascular: Normal rate.   Pulmonary/Chest: Effort normal. No respiratory distress.  Abdominal: He exhibits no distension.  Musculoskeletal: Normal range of motion.  Neurological: He is alert.  Skin: Skin is dry.  Psychiatric:  Patient was speaking to someone or himself prior to my evaluation  and had an odd affect.  No obvious responding to external stimuli on my direct exam. He is inattentive.  Nursing note and vitals reviewed.    ED Treatments / Results  Labs (all labs ordered are listed, but only abnormal results are displayed) Labs Reviewed  COMPREHENSIVE METABOLIC PANEL - Abnormal; Notable for the following:       Result Value   Glucose, Bld 121 (*)    AST 43 (*)    All other components within normal limits  ACETAMINOPHEN LEVEL - Abnormal; Notable for the following:    Acetaminophen (Tylenol), Serum <10 (*)    All other components within normal limits  CBC - Abnormal; Notable for the following:    WBC 10.7 (*)    All other components within normal limits  RAPID URINE DRUG SCREEN, HOSP PERFORMED - Abnormal; Notable for the following:    Tetrahydrocannabinol POSITIVE (*)    All other components within normal limits  ETHANOL  SALICYLATE LEVEL    EKG  EKG Interpretation None       Radiology No results found.  Procedures Procedures (including critical care time)  Medications Ordered in ED Medications - No data to display   Initial Impression / Assessment and Plan / ED Course  I have reviewed the triage vital signs and the nursing notes.  Pertinent labs & imaging results that were available during my care of the patient were reviewed by me and considered in my medical decision making (see chart for details).     Worsening suicidal thoughts. Possibly psychosis. Medically cleared for TTS consult and recommendations.   Final Clinical Impressions(s) / ED Diagnoses   Final diagnoses:  Suicidal thoughts  Depression, unspecified depression type    New Prescriptions New Prescriptions   No medications on file     Batsheva Stevick, Barbara Cower, MD 05/27/17 [phone]

## 2017-05-27 NOTE — Consult Note (Signed)
[place] [facility] Consult   Reason for Consult:  Substance abuse with suicidal ideations Referring Physician:  EDP Patient Identification: Lawrence [name] MRN:  [id number] Principal Diagnosis: Substance induced mood disorder ([place]) Diagnosis:   Patient Active Problem List   Diagnosis Date Noted  . Cannabis use disorder, severe, dependence ([place]) [F12.20] 05/13/2016    Priority: High  . Substance induced mood disorder ([place]) [F19.94] 05/13/2016    Priority: High  . Intermittent explosive disorder [F63.81] 05/13/2016  . Intoxication by drug [facility]) [Q68.341] 07/12/2014  . Intoxication [IMO0002] 07/12/2014  . Depression [F32.9] 07/12/2014  . Overdose of antitussive [T48.3X1A]     Total Time spent with patient: 45 minutes  Subjective:   Lawrence [name] is a 21 y.o. male patient does not warrant admission.  HPI:  21 yo male who presented to the [facility] initially with anger and suicidal ideations with cannabis abuse.  He was mad and had suicidal ideations to upset certain people in his life.  Odell was sent to the ED and calmed down.  Denies suicidal ideations to the EDP last night and again this morning with psychiatry.  No suicidal/homicidal ideations, hallucinations, and substance abuse.  He wanted to leave after a friend visited and did not want to wait for acceptance to a hospital.  Casen wanted outpatient, coherent and stable.  Discharge home, voluntary.  Caveat:  History of intermittent explosive disorder.  Past Psychiatric History: substance abuse, depression, IED  Risk to Self: Is patient at risk for suicide?: denies Risk to Others:   Prior Inpatient Therapy:   Prior Outpatient Therapy:    Past Medical History:  Past Medical History:  Diagnosis Date  . ADD (attention deficit disorder)   . Asthma   . Depressed   . Schizoaffective disorder ([place])    No past surgical history on file. Family History: History reviewed. No pertinent family history. Family Psychiatric   History: unknown Social History:  History  Alcohol Use  . Yes    Comment: Last use was last week     History  Drug Use  . Frequency: 14.0 times per week  . Types: Marijuana    Comment: Reported last use was 05/25/17    Social History   Social History  . Marital status: Single    Spouse name: N/A  . Number of children: N/A  . Years of education: N/A   Social History Main Topics  . Smoking status: Current Some Day Smoker    Types: Cigars  . Smokeless tobacco: Never Used  . Alcohol use Yes     Comment: Last use was last week  . Drug use: Yes    Frequency: 14.0 times per week    Types: Marijuana     Comment: Reported last use was 05/25/17  . Sexual activity: No   Other Topics Concern  . None   Social History Narrative  . None   Additional Social History:    Allergies:   Allergies  Allergen Reactions  . Bee Venom Anaphylaxis  . Apple   . Corn-Containing Products   . Lac Bovis Itching    Milk Related  . Other     Green Beans:   Other reaction(s): ASTHMA, RASH    Labs:  Results for orders placed or performed during the hospital encounter of 05/26/17 (from the past 48 hour(s))  Comprehensive metabolic panel     Status: Abnormal   Collection Time: 05/26/17  5:05 PM  Result Value Ref Range   Sodium 140 135 -  145 mmol/L   Potassium 4.1 3.5 - 5.1 mmol/L   Chloride 106 101 - 111 mmol/L   CO2 27 22 - 32 mmol/L   Glucose, Bld 121 (H) 65 - 99 mg/dL   BUN 11 6 - 20 mg/dL   Creatinine, Ser 1.20 0.61 - 1.24 mg/dL   Calcium 9.4 8.9 - 10.3 mg/dL   Total Protein 7.5 6.5 - 8.1 g/dL   Albumin 4.6 3.5 - 5.0 g/dL   AST 43 (H) 15 - 41 U/L   ALT 33 17 - 63 U/L   Alkaline Phosphatase 71 38 - 126 U/L   Total Bilirubin 0.6 0.3 - 1.2 mg/dL   GFR calc non Af Amer >60 >60 mL/min   GFR calc Af Amer >60 >60 mL/min    Comment: (NOTE) The eGFR has been calculated using the CKD EPI equation. This calculation has not been validated in all clinical situations. eGFR's persistently  <60 mL/min signify possible Chronic Kidney Disease.    Anion gap 7 5 - 15  Ethanol     Status: None   Collection Time: 05/26/17  5:05 PM  Result Value Ref Range   Alcohol, Ethyl (B) <10 <10 mg/dL    Comment:        LOWEST DETECTABLE LIMIT FOR SERUM ALCOHOL IS 10 mg/dL FOR MEDICAL PURPOSES ONLY Please note change in reference range.   Salicylate level     Status: None   Collection Time: 05/26/17  5:05 PM  Result Value Ref Range   Salicylate Lvl <3.3 2.8 - 30.0 mg/dL  Acetaminophen level     Status: Abnormal   Collection Time: 05/26/17  5:05 PM  Result Value Ref Range   Acetaminophen (Tylenol), Serum <10 (L) 10 - 30 ug/mL    Comment:        THERAPEUTIC CONCENTRATIONS VARY SIGNIFICANTLY. A RANGE OF 10-30 ug/mL MAY BE AN EFFECTIVE CONCENTRATION FOR MANY PATIENTS. HOWEVER, SOME ARE BEST TREATED AT CONCENTRATIONS OUTSIDE THIS RANGE. ACETAMINOPHEN CONCENTRATIONS >150 ug/mL AT 4 HOURS AFTER INGESTION AND >50 ug/mL AT 12 HOURS AFTER INGESTION ARE OFTEN ASSOCIATED WITH TOXIC REACTIONS.   cbc     Status: Abnormal   Collection Time: 05/26/17  5:05 PM  Result Value Ref Range   WBC 10.7 (H) 4.0 - 10.5 K/uL   RBC 4.76 4.22 - 5.81 MIL/uL   Hemoglobin 14.6 13.0 - 17.0 g/dL   HCT 43.3 39.0 - 52.0 %   MCV 91.0 78.0 - 100.0 fL   MCH 30.7 26.0 - 34.0 pg   MCHC 33.7 30.0 - 36.0 g/dL   RDW 13.2 11.5 - 15.5 %   Platelets 208 150 - 400 K/uL  Rapid urine drug screen (hospital performed)     Status: Abnormal   Collection Time: 05/26/17  5:05 PM  Result Value Ref Range   Opiates NONE DETECTED NONE DETECTED   Cocaine NONE DETECTED NONE DETECTED   Benzodiazepines NONE DETECTED NONE DETECTED   Amphetamines NONE DETECTED NONE DETECTED   Tetrahydrocannabinol POSITIVE (A) NONE DETECTED   Barbiturates NONE DETECTED NONE DETECTED    Comment:        DRUG SCREEN FOR MEDICAL PURPOSES ONLY.  IF CONFIRMATION IS NEEDED FOR ANY PURPOSE, NOTIFY LAB WITHIN 5 DAYS.        LOWEST DETECTABLE  LIMITS FOR URINE DRUG SCREEN Drug Class       Cutoff (ng/mL) Amphetamine      1000 Barbiturate      200 Benzodiazepine   825 Tricyclics  300 Opiates          300 Cocaine          300 THC              50     No current facility-administered medications for this encounter.    Current Outpatient Prescriptions  Medication Sig Dispense Refill  . escitalopram (LEXAPRO) 10 MG tablet Take 1 tablet (10 mg total) by mouth daily. For depression 30 tablet 0  . Oxcarbazepine (TRILEPTAL) 300 MG tablet Take 1 tablet (300 mg total) by mouth 2 (two) times daily. For mood stabilization 60 tablet 0  . risperiDONE (RISPERDAL) 1 MG tablet Take 1 tablet (1 mg total) by mouth at bedtime. For mood control 30 tablet 0  . tetrahydrozoline 0.05 % ophthalmic solution Place 2 drops into both eyes as needed (redness).    . traZODone (DESYREL) 50 MG tablet Take 1 tablet (50 mg) at bedtime: For sleep. 30 tablet 0  . VENTOLIN HFA 108 (90 Base) MCG/ACT inhaler Take 2 puffs by mouth as needed for shortness of breath.  3  . alum & mag hydroxide-simeth (MAALOX/MYLANTA) 200-200-20 MG/5ML suspension Take 30 mLs by mouth every 4 (four) hours as needed for indigestion. (Patient not taking: Reported on 05/26/2017) 355 mL 0  . magnesium hydroxide (MILK OF MAGNESIA) 400 MG/5ML suspension Take 30 mLs by mouth daily as needed for mild constipation. (Patient not taking: Reported on 05/26/2017) 360 mL 0  . nicotine (NICODERM CQ - DOSED IN MG/24 HOURS) 14 mg/24hr patch Place 1 patch (14 mg total) onto the skin daily. For smoking ceasation (Patient not taking: Reported on 05/26/2017) 28 patch 0    Musculoskeletal: Strength & Muscle Tone: within normal limits Gait & Station: normal Patient leans: N/A  Psychiatric Specialty Exam: Physical Exam  Constitutional: He is oriented to person, place, and time. He appears well-developed and well-nourished.  HENT:  Head: Normocephalic.  Neck: Normal range of motion.  Respiratory: Effort  normal.  Musculoskeletal: Normal range of motion.  Neurological: He is alert and oriented to person, place, and time.  Psychiatric: He has a normal mood and affect. His speech is normal and behavior is normal. Judgment and thought content normal. Cognition and memory are normal.    Review of Systems  Psychiatric/Behavioral: Positive for substance abuse.  All other systems reviewed and are negative.   Blood pressure 103/71, pulse 61, temperature 98.2 F (36.8 C), temperature source Oral, resp. rate 16, height '6\' 1"'$  (1.854 m), weight 82.1 kg (181 lb), SpO2 100 %.Body mass index is 23.88 kg/m.  General Appearance: Casual  Eye Contact:  Good  Speech:  Normal Rate  Volume:  Normal  Mood:  Irritable at times  Affect:  Congruent  Thought Process:  Coherent and Descriptions of Associations: Intact  Orientation:  Full (Time, Place, and Person)  Thought Content:  WDL and Logical  Suicidal Thoughts:  No  Homicidal Thoughts:  No  Memory:  Immediate;   Good Recent;   Good Remote;   Good  Judgement:  Good  Insight:  Fair  Psychomotor Activity:  Normal  Concentration:  Concentration: Good and Attention Span: Good  Recall:  Good  Fund of Knowledge:  Fair  Language:  Good  Akathisia:  No  Handed:  Right  AIMS (if indicated):     Assets:  Housing Leisure Time Physical Health Resilience  ADL's:  Intact  Cognition:  WNL  Sleep:        Treatment Plan Summary: Daily  contact with patient to assess and evaluate symptoms and progress in treatment, Medication management and Plan substance induced mood disorder:  -Crisis stabilization -Medication management:  Medications not restarted as he wanted to leave and follow-up outpatient -Individual counseling  Disposition: No evidence of imminent risk to self or others at present.    Waylan Boga, NP 05/27/2017 12:04 PM  Patient seen face-to-face for psychiatric evaluation, chart reviewed and case discussed with the physician extender and  developed treatment plan. Reviewed the information documented and agree with the treatment plan. Corena Pilgrim, MD

## 2017-05-27 NOTE — BH Assessment (Signed)
[facility] Assessment Progress Note  Per Danice Goltz, pt has been accepted to [facility] by Erling Conte, MD on 05/27/17 pending discharges. Call to report [phone]. Time of bed availability is pending per Theo who states that [facility] will call in AM to inform staff of the time the bed will be available. Junior, RN notified of acceptance and to wait to send pt to [facility] after hearing from AM shift to arrange transfer.  Princess Bruins, MSW, LCSW Therapeutic Triage Specialist  [phone]

## 2017-05-27 NOTE — [facility] Suicide Risk Assessment (Signed)
Suicide Risk Assessment  Discharge Assessment   [facility] Discharge Suicide Risk Assessment   Principal Problem: Substance induced mood disorder [facility]) Discharge Diagnoses:  Patient Active Problem List   Diagnosis Date Noted  . Cannabis use disorder, severe, dependence (HCC) [F12.20] 05/13/2016    Priority: High  . Substance induced mood disorder (HCC) [F19.94] 05/13/2016    Priority: High  . Intermittent explosive disorder [F63.81] 05/13/2016  . Intoxication by drug [facility]) [Z61.096] 07/12/2014  . Intoxication [IMO0002] 07/12/2014  . Depression [F32.9] 07/12/2014  . Overdose of antitussive [T48.3X1A]     Total Time spent with patient: 45 minutes  Musculoskeletal: Strength & Muscle Tone: within normal limits Gait & Station: normal Patient leans: N/A  Psychiatric Specialty Exam: Physical Exam  Constitutional: He is oriented to person, place, and time. He appears well-developed and well-nourished.  HENT:  Head: Normocephalic.  Neck: Normal range of motion.  Respiratory: Effort normal.  Musculoskeletal: Normal range of motion.  Neurological: He is alert and oriented to person, place, and time.  Psychiatric: He has a normal mood and affect. His speech is normal and behavior is normal. Judgment and thought content normal. Cognition and memory are normal.    Review of Systems  Psychiatric/Behavioral: Positive for substance abuse.  All other systems reviewed and are negative.   Blood pressure 103/71, pulse 61, temperature 98.2 F (36.8 C), temperature source Oral, resp. rate 16, height  (1.854 m), weight 82.1 kg (181 lb), SpO2 100 %.Body mass index is 23.88 kg/m.  General Appearance: Casual  Eye Contact:  Good  Speech:  Normal Rate  Volume:  Normal  Mood:  Irritable at times  Affect:  Congruent  Thought Process:  Coherent and Descriptions of Associations: Intact  Orientation:  Full (Time, Place, and Person)  Thought Content:  WDL and Logical  Suicidal Thoughts:  No   Homicidal Thoughts:  No  Memory:  Immediate;   Good Recent;   Good Remote;   Good  Judgement:  Good  Insight:  Fair  Psychomotor Activity:  Normal  Concentration:  Concentration: Good and Attention Span: Good  Recall:  Good  Fund of Knowledge:  Fair  Language:  Good  Akathisia:  No  Handed:  Right  AIMS (if indicated):     Assets:  Housing Leisure Time Physical Health Resilience  ADL's:  Intact  Cognition:  WNL  Sleep:      Mental Status Per Nursing Assessment::   On Admission:   substance abuse with suicidal ideations  Demographic Factors:  Male and Adolescent or young adult  Loss Factors: Legal issues  Historical Factors: NA  Risk Reduction Factors:   Sense of responsibility to family, Living with another person, especially a relative and Positive social support  Continued Clinical Symptoms:  Irritable at times  Cognitive Features That Contribute To Risk:  None    Suicide Risk:  Minimal: No identifiable suicidal ideation.  Patients presenting with no risk factors but with morbid ruminations; may be classified as minimal risk based on the severity of the depressive symptoms    Plan Of Care/Follow-up recommendations:  Activity:  as tolerated Diet:  heart healthy diet  LORD, JAMISON, NP 05/27/2017, 3:00 PM

## 2017-09-20 ENCOUNTER — Inpatient Hospital Stay (HOSPITAL_COMMUNITY)
Admission: AD | Admit: 2017-09-20 | Discharge: 2017-09-26 | DRG: 885 | Disposition: A | Discharge status: Home / Self Care | Payer: Federal, State, Local not specified - Other | Source: Intra-hospital | Attending: Psychiatry | Admitting: Psychiatry

## 2017-09-20 ENCOUNTER — Emergency Department (HOSPITAL_COMMUNITY)
Admission: EM | Admit: 2017-09-20 | Discharge: 2017-09-20 | Disposition: A | Discharge status: Other Acute Inpatient Hospital | Payer: PRIVATE HEALTH INSURANCE | Attending: Emergency Medicine | Admitting: Emergency Medicine

## 2017-09-20 ENCOUNTER — Other Ambulatory Visit: Payer: Self-pay

## 2017-09-20 ENCOUNTER — Encounter (HOSPITAL_COMMUNITY): Payer: Self-pay

## 2017-09-20 DIAGNOSIS — Z91018 Allergy to other foods: Secondary | ICD-10-CM

## 2017-09-20 DIAGNOSIS — R45850 Homicidal ideations: Secondary | ICD-10-CM | POA: Diagnosis present

## 2017-09-20 DIAGNOSIS — F17290 Nicotine dependence, other tobacco product, uncomplicated: Secondary | ICD-10-CM | POA: Diagnosis present

## 2017-09-20 DIAGNOSIS — R450 Nervousness: Secondary | ICD-10-CM | POA: Diagnosis not present

## 2017-09-20 DIAGNOSIS — F250 Schizoaffective disorder, bipolar type: Secondary | ICD-10-CM | POA: Insufficient documentation

## 2017-09-20 DIAGNOSIS — J45909 Unspecified asthma, uncomplicated: Secondary | ICD-10-CM | POA: Insufficient documentation

## 2017-09-20 DIAGNOSIS — Z046 Encounter for general psychiatric examination, requested by authority: Secondary | ICD-10-CM | POA: Insufficient documentation

## 2017-09-20 DIAGNOSIS — F5105 Insomnia due to other mental disorder: Secondary | ICD-10-CM | POA: Diagnosis present

## 2017-09-20 DIAGNOSIS — F988 Other specified behavioral and emotional disorders with onset usually occurring in childhood and adolescence: Secondary | ICD-10-CM | POA: Diagnosis present

## 2017-09-20 DIAGNOSIS — Z87892 Personal history of anaphylaxis: Secondary | ICD-10-CM

## 2017-09-20 DIAGNOSIS — R45851 Suicidal ideations: Secondary | ICD-10-CM | POA: Diagnosis not present

## 2017-09-20 DIAGNOSIS — Z818 Family history of other mental and behavioral disorders: Secondary | ICD-10-CM | POA: Diagnosis not present

## 2017-09-20 DIAGNOSIS — R440 Auditory hallucinations: Secondary | ICD-10-CM

## 2017-09-20 DIAGNOSIS — Z91011 Allergy to milk products: Secondary | ICD-10-CM

## 2017-09-20 DIAGNOSIS — F419 Anxiety disorder, unspecified: Secondary | ICD-10-CM | POA: Diagnosis present

## 2017-09-20 DIAGNOSIS — Z91030 Bee allergy status: Secondary | ICD-10-CM

## 2017-09-20 DIAGNOSIS — Z79899 Other long term (current) drug therapy: Secondary | ICD-10-CM | POA: Insufficient documentation

## 2017-09-20 DIAGNOSIS — Z560 Unemployment, unspecified: Secondary | ICD-10-CM | POA: Diagnosis not present

## 2017-09-20 DIAGNOSIS — F1211 Cannabis abuse, in remission: Secondary | ICD-10-CM | POA: Diagnosis not present

## 2017-09-20 DIAGNOSIS — F1290 Cannabis use, unspecified, uncomplicated: Secondary | ICD-10-CM | POA: Diagnosis not present

## 2017-09-20 DIAGNOSIS — F1994 Other psychoactive substance use, unspecified with psychoactive substance-induced mood disorder: Secondary | ICD-10-CM | POA: Diagnosis not present

## 2017-09-20 DIAGNOSIS — F17210 Nicotine dependence, cigarettes, uncomplicated: Secondary | ICD-10-CM | POA: Diagnosis not present

## 2017-09-20 LAB — COMPREHENSIVE METABOLIC PANEL
ALT: 32 U/L (ref 17–63)
ANION GAP: 7 (ref 5–15)
AST: 39 U/L (ref 15–41)
Albumin: 4.3 g/dL (ref 3.5–5.0)
Alkaline Phosphatase: 61 U/L (ref 38–126)
BUN: 11 mg/dL (ref 6–20)
CHLORIDE: 106 mmol/L (ref 101–111)
CO2: 27 mmol/L (ref 22–32)
Calcium: 9.1 mg/dL (ref 8.9–10.3)
Creatinine, Ser: 1.18 mg/dL (ref 0.61–1.24)
GFR calc Af Amer: >60 mL/min (ref >60)
Glucose, Bld: 87 mg/dL (ref 65–99)
POTASSIUM: 4.2 mmol/L (ref 3.5–5.1)
SODIUM: 140 mmol/L (ref 135–145)
Total Bilirubin: 1.2 mg/dL (ref 0.3–1.2)
Total Protein: 7 g/dL (ref 6.5–8.1)

## 2017-09-20 LAB — CBC
HCT: 43.9 % (ref 39.0–52.0)
HEMOGLOBIN: 15.2 g/dL (ref 13.0–17.0)
MCH: 31.6 pg (ref 26.0–34.0)
MCHC: 34.6 g/dL (ref 30.0–36.0)
MCV: 91.3 fL (ref 78.0–100.0)
PLATELETS: 162 10*3/uL (ref 150–400)
RBC: 4.81 MIL/uL (ref 4.22–5.81)
RDW: 13 % (ref 11.5–15.5)
WBC: 8.2 10*3/uL (ref 4.0–10.5)

## 2017-09-20 LAB — RAPID URINE DRUG SCREEN, HOSP PERFORMED
AMPHETAMINES: NOT DETECTED
Barbiturates: NOT DETECTED
Benzodiazepines: NOT DETECTED
COCAINE: NOT DETECTED
OPIATES: NOT DETECTED
TETRAHYDROCANNABINOL: POSITIVE — AB

## 2017-09-20 LAB — ACETAMINOPHEN LEVEL

## 2017-09-20 LAB — ETHANOL

## 2017-09-20 LAB — SALICYLATE LEVEL: Salicylate Lvl: <7 mg/dL (ref 2.8–30.0)

## 2017-09-20 MED ORDER — TRAZODONE HCL 50 MG PO TABS
50.0000 mg | ORAL_TABLET | Freq: Every day | ORAL | Status: DC
  Administered 2017-09-20 – 2017-09-25 (×6): 50 mg via ORAL
  Filled 2017-09-20: qty 1
  Filled 2017-09-20: qty 7
  Filled 2017-09-20 (×5): qty 1

## 2017-09-20 MED ORDER — MAGNESIUM HYDROXIDE 400 MG/5ML PO SUSP: 30.0000 mL | Freq: Every day | ORAL | Status: DC | PRN

## 2017-09-20 MED ORDER — ESCITALOPRAM OXALATE 10 MG PO TABS
10.0000 mg | ORAL_TABLET | Freq: Every day | ORAL | Status: DC
  Administered 2017-09-21 – 2017-09-26 (×6): 10 mg via ORAL
  Filled 2017-09-20 (×3): qty 1
  Filled 2017-09-20: qty 7
  Filled 2017-09-20 (×5): qty 1

## 2017-09-20 MED ORDER — OXCARBAZEPINE 300 MG PO TABS
300.0000 mg | ORAL_TABLET | Freq: Two times a day (BID) | ORAL | Status: DC
  Administered 2017-09-20 – 2017-09-26 (×12): 300 mg via ORAL
  Filled 2017-09-20 (×7): qty 1
  Filled 2017-09-20: qty 14
  Filled 2017-09-20 (×2): qty 1
  Filled 2017-09-20: qty 14
  Filled 2017-09-20 (×4): qty 1

## 2017-09-20 MED ORDER — TRAZODONE HCL 50 MG PO TABS
50.0000 mg | ORAL_TABLET | Freq: Every day | ORAL | Status: DC
  Filled 2017-09-20: qty 1

## 2017-09-20 MED ORDER — ALUM & MAG HYDROXIDE-SIMETH 200-200-20 MG/5ML PO SUSP: 30.0000 mL | ORAL | Status: DC | PRN

## 2017-09-20 MED ORDER — ESCITALOPRAM OXALATE 10 MG PO TABS: 10.0000 mg | ORAL_TABLET | Freq: Every day | ORAL | Status: DC

## 2017-09-20 MED ORDER — RISPERIDONE 1 MG PO TABS
1.0000 mg | ORAL_TABLET | Freq: Every day | ORAL | Status: DC
  Filled 2017-09-20: qty 1

## 2017-09-20 MED ORDER — OXCARBAZEPINE 300 MG PO TABS
300.0000 mg | ORAL_TABLET | Freq: Two times a day (BID) | ORAL | Status: DC
  Filled 2017-09-20: qty 1

## 2017-09-20 MED ORDER — RISPERIDONE 1 MG PO TABS
1.0000 mg | ORAL_TABLET | Freq: Every day | ORAL | Status: DC
  Administered 2017-09-20: 1 mg via ORAL
  Filled 2017-09-20 (×2): qty 1

## 2017-09-20 NOTE — ED Notes (Signed)
Pt admitted to room #40. Pt pleasant on approach. Pt endorsing SI with out plan. Pt denies AVH. Pt endorsing depression. Reports he has been compliant with medication regimen. Encouragement and support provided. Special checks q 15 mins in place for safety, Video monitoring in place. Will continue to monitor.

## 2017-09-20 NOTE — BH Assessment (Signed)
[facility] Assessment Progress Note Case was staffed with Lord DNP who recommended patient be admitted inpatient as appropriate bed placement is investigated      

## 2017-09-20 NOTE — ED Notes (Signed)
Orders received to discharge patient. Pt aware and discharge summery reviewed with patient as well as review of medications. All personal items given to [facility] transporter and patient signed all discharge paperwork. Pt escorted off unit.

## 2017-09-20 NOTE — BH Assessment (Addendum)
Assessment Note  Lawrence [name] is an 22 y.o. male that presents this date with S/I and H/I after making statements in group at [facility] in reference to harming himself and family members. Patient states he currently resides with his parents who "don't understand his needs" and after a verbal altercation earlier this date started feeling he might "hurt himself or some of his family members" although does not have a plan or intent. Patient presents with a pleasant affect and is oriented to time/place and denies any AVH. Patient admits to active SA use reporting he uses Cannabis daily with last reported use earlier this date when patient reported he used over one gram. Patient states he is currently receiving services from [facility] who assists with medication management. Patient reports he last met with that provider two weeks ago and states he is current with his medication regimen. Patient denies any current MH symptoms and feels his medications are working as indicated. Patient stated he is just having "a bad day" although has a history of admissions at [facility]. Per chart review patient was noted to be seen last at [facility] after a family altercation when patient expressed on 05/26/17. Per note review this date, "Patient has a history depression, schizoaffective disorder and ADD presenting to the ED to be evaluation for SI and HI. Per note review patient admitted to active AVH but denies at the time of assessment.  Patient reports he was at his peer to peer support group, and one of his peers asked him if he was having any thoughts, to which he reported SI and HI and then someone from [facility] drove him here to be evaluated. Patient reports she has been having a lot of dark thoughts recently, and feels he has been in a very dark place, he reports his life is "complete shift, and he does not feel like he has anything to live for". Patient endorses suicidal ideations as well as homicidal ideations, patient reported to nursing "homicide  would be the best way to complete my suicide, that is the take it". Patient reported homicidal ideations towards his family members, which provide stress in his life. Patient reports he sees his therapist regularly, last appointment last week, is on current medications and goes to peer support group at [facility] regularly, despite this he has been in a darker place recently. Patient denies any recent self-injurious behavior, but does have a history of cutting". Case was staffed with Lawrence Cliche DNP who recommended patient be admitted inpatient as appropriate bed placement is investigated.     Diagnosis: F25.0 Schizoaffective disorder, bipolar type   Past Medical History:  Past Medical History:  Diagnosis Date  . ADD (attention deficit disorder)   . Asthma   . Depressed   . Schizoaffective disorder ([place])     History reviewed. No pertinent surgical history.  Family History: No family history on file.  Social History:  reports that he has been smoking cigars.  he has never used smokeless tobacco. He reports that he drinks alcohol. He reports that he uses drugs. Drug: Marijuana. Frequency: 14.00 times per week.  Additional Social History:  Alcohol / Drug Use Pain Medications: See MAR Prescriptions: See MAR Over the Counter: See MAR History of alcohol / drug use?: Yes Longest period of sobriety (when/how long): UNKNOWN Negative Consequences of Use: (Denies this date) Withdrawal Symptoms: Tremors, Agitation Substance #1 Name of Substance 1: Amphetamines 1 - Age of First Use: 19 1 - Amount (size/oz): Unknown 1 - Frequency: Unknown 1 -  Duration: Unknown 1 - Last Use / Amount: Post this date for amphetamines Substance #2 Name of Substance 2: Cannabis 2 - Age of First Use: teens 2 - Amount (size/oz): Varies 2 - Frequency: Daily 2 - Duration: Last seven years 2 - Last Use / Amount: 09/20/17 1 gram  CIWA: CIWA-Ar BP: (!) 117/56 Pulse Rate: (!) 53 COWS:    Allergies:  Allergies  Allergen  Reactions  . Bee Venom Anaphylaxis  . Apple   . Corn-Containing Products   . Lac Bovis Itching    Milk Related  . Other     Green Beans:   Other reaction(s): ASTHMA, RASH    Home Medications:  (Not in a hospital admission)  OB/GYN Status:  No LMP for male patient.  General Assessment Data Location of Assessment: [facility] TTS Assessment: In system Is this a Tele or Face-to-Face Assessment?: Face-to-Face Is this an Initial Assessment or a Re-assessment for this encounter?: Initial Assessment Marital status: Single Maiden name: NA Is patient pregnant?: No Pregnancy Status: No Living Arrangements: Parent Can pt return to current living arrangement?: Yes Admission Status: Voluntary Is patient capable of signing voluntary admission?: Yes Referral Source: Self/Family/Friend Insurance type: Self Pay  Medical Screening Exam ([place]) Medical Exam completed: Yes  Crisis Care Plan Living Arrangements: Parent Legal Guardian: (NA) Name of Psychiatrist: Warden/ranger (Per notes) Name of Therapist: [facility]  Education Status Is patient currently in school?: No Current Grade: (NA) 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 : Yes Suicidal Intent: No Has patient had any suicidal intent within the past 6 months prior to admission? : No Is patient at risk for suicide?: Yes Suicidal Plan?: No Has patient had any suicidal plan within the past 6 months prior to admission? : No Specify Current Suicidal Plan: NA Access to Means: No What has been your use of drugs/alcohol within the last 12 months?: Current use Previous Attempts/Gestures: No How many times?: 0 Other Self Harm Risks: NA Triggers for Past Attempts: Family contact Intentional Self Injurious Behavior:  None Family Suicide History: No Recent stressful life event(s): Other (Comment)(Family issues) Persecutory voices/beliefs?: No Depression: Yes Depression Symptoms: Feeling worthless/self pity Substance abuse history and/or treatment for substance abuse?: No Suicide prevention information given to non-admitted patients: Not applicable  Risk to Others within the past 6 months Homicidal Ideation: Yes-Currently Present Does patient have any lifetime risk of violence toward others beyond the six months prior to admission? : No Thoughts of Harm to Others: Yes-Currently Present Comment - Thoughts of Harm to Others: Pt is vague in reference to content Current Homicidal Intent: No Current Homicidal Plan: No Access to Homicidal Means: No Identified Victim: NA History of harm to others?: No Assessment of Violence: None Noted Violent Behavior Description: NA Does patient have access to weapons?: No Criminal Charges Pending?: No Does patient have a court date: No Is patient on probation?: No  Psychosis Hallucinations: None noted Delusions: None noted  Mental Status Report Appearance/Hygiene: In scrubs Eye Contact: Fair Motor Activity: Freedom of movement Speech: Unremarkable Level of Consciousness: Alert Mood: Pleasant Affect: Appropriate to circumstance Anxiety Level: Minimal Thought Processes: Coherent, Relevant Judgement: Unimpaired Orientation: Person, Place, Time Obsessive Compulsive Thoughts/Behaviors: None  Cognitive Functioning Concentration: Good Memory: Recent Intact, Remote Intact IQ: Average Insight: Fair  Impulse Control: Fair Appetite: Good Weight Loss: 0 Weight Gain: 0 Sleep: No Change Total Hours of Sleep: 7 Vegetative Symptoms: None  ADLScreening [facility] Assessment Services) Patient's cognitive ability adequate to safely complete daily activities?: Yes Patient able to express need for assistance with ADLs?: Yes Independently performs ADLs?: Yes (appropriate  for developmental age)  Prior Inpatient Therapy Prior Inpatient Therapy: Yes Prior Therapy Dates: 2017 Prior Therapy Facilty/Provider(s): [facility] Reason for Treatment: MH issues  Prior Outpatient Therapy Prior Outpatient Therapy: Yes Prior Therapy Dates: 2018 Prior Therapy Facilty/Provider(s): [facility] Reason for Treatment: IOP Does patient have an ACCT team?: No Does patient have Intensive In-House Services?  : No Does patient have [facility] services? : No Does patient have P4CC services?: No  ADL Screening (condition at time of admission) Patient's cognitive ability adequate to safely complete daily activities?: Yes Is the patient deaf or have difficulty hearing?: No Does the patient have difficulty seeing, even when wearing glasses/contacts?: No Does the patient have difficulty concentrating, remembering, or making decisions?: No Patient able to express need for assistance with ADLs?: Yes Does the patient have difficulty dressing or bathing?: No Independently performs ADLs?: Yes (appropriate for developmental age) Does the patient have difficulty walking or climbing stairs?: No Weakness of Legs: None Weakness of Arms/Hands: None  Home Assistive Devices/Equipment Home Assistive Devices/Equipment: None  Therapy Consults (therapy consults require a physician order) PT Evaluation Needed: No OT Evalulation Needed: No SLP Evaluation Needed: No Abuse/Neglect Assessment (Assessment to be complete while patient is alone) Physical Abuse: Denies Verbal Abuse: Denies Sexual Abuse: Denies Exploitation of patient/patient's resources: Denies Self-Neglect: Denies Values / Beliefs Cultural Requests During Hospitalization: None Spiritual Requests During Hospitalization: None Consults Spiritual Care Consult Needed: No Social Work Consult Needed: No Regulatory affairs officer (For Healthcare) Does Patient Have a Medical Advance Directive?: No Would patient like information on creating a medical  advance directive?: No - Patient declined    Additional Information 1:1 In Past 12 Months?: No CIRT Risk: No Elopement Risk: No Does patient have medical clearance?: Yes     Disposition: Case was staffed with Lawrence Cliche DNP who recommended patient be admitted inpatient as appropriate bed placement is investigated.   Disposition Initial Assessment Completed for this Encounter: Yes Disposition of Patient: Inpatient treatment program Type of inpatient treatment program: Adult  On Site Evaluation by:   Reviewed with Physician:    Mamie Nick 09/20/2017 5:08 PM

## 2017-09-20 NOTE — ED Notes (Signed)
SBAR Report received from previous nurse. Pt received calm and visible on unit. Pt denies current SI/ HI, A/V H, depression, anxiety, or pain at this time, and appears otherwise stable and free of distress. Pt reminded of camera surveillance, q 15 min rounds, and rules of the milieu. Will continue to assess. 

## 2017-09-20 NOTE — ED Triage Notes (Signed)
Pt admits SI/HI. [facility] be [facility]. Pt states no plan. Hearing voices. Has therapist and compliant with medications. Last appointment last week.

## 2017-09-20 NOTE — Progress Notes (Signed)
[facility] [id number], after 7 pm tonight.   Nanine MeansJamison Fahim Kats, PMHNP

## 2017-09-20 NOTE — ED Notes (Signed)
[facility] Expected date:  Expected time:  Means of arrival:  Comments: HOLD 26

## 2017-09-20 NOTE — ED Provider Notes (Signed)
[facility]   CSN: [id number] Arrival date & time: 09/20/17  1404     History   Chief Complaint Chief Complaint  Patient presents with  . Suicidal  . Homicidal    HPI  Lawrence [name] is a 22 y.o. Male history of asthma, depression, schizoaffective disorder and ADD presents to the ED to the ED for evaluation of SI and HI.  Patient reports he was at his peer to peer support group, and one of his peers asked him if he was having any thoughts, to which he reported SI and HI and then someone from [facility] drove him here to be evaluated.  Patient reports she has been having a lot of dark thoughts recently, and feels he has been in a very dark place, he reports his life is "complete shift, and he does not feel like he has anything to live for".  Patient endorses suicidal ideations as well as homicidal ideations, patient reported to nursing "homicide would be the best way to complete my suicide, that is the take it".  When I spoke with the patient he reported these homicidal ideations or towards his family members, which provide stress in his life.  Patient reports he sees his therapist regularly, last appointment last week, is on current medications and goes to peer support group at [facility] regularly, despite this he has been in a darker place recently.  Patient also reports he has been hearing voices, these are not commanding voices, but they tell him what ever he does not to feel bad about it because it will be okay, because his life is nothing.  Patient denies any recent self-injurious behavior, but does have a history of cutting.  Denies any ingestions.  Reports he uses marijuana and smokes cigarettes occasionally, denies alcohol use or any other substance use.  Patient denies any focal medical complaints, no URI symptoms, cough, fevers, chest pain, shortness of breath, abdominal pain, nausea, vomiting, denies pain anywhere.      Past Medical History:    Diagnosis Date  . ADD (attention deficit disorder)   . Asthma   . Depressed   . Schizoaffective disorder [facility])     Patient Active Problem List   Diagnosis Date Noted  . Intermittent explosive disorder 05/13/2016  . Cannabis use disorder, severe, dependence (HCC) 05/13/2016  . Substance induced mood disorder (HCC) 05/13/2016  . Intoxication by drug (HCC) 07/12/2014  . Intoxication 07/12/2014  . Depression 07/12/2014  . Overdose of antitussive     History reviewed. No pertinent surgical history.     Home Medications    Prior to Admission medications   Medication Sig Start Date End Date Taking? Authorizing Provider  alum & mag hydroxide-simeth (MAALOX/MYLANTA) 200-200-20 MG/5ML suspension Take 30 mLs by mouth every 4 (four) hours as needed for indigestion. Patient not taking: Reported on 05/26/2017 01/14/17   Armandina Stammer I, NP  escitalopram (LEXAPRO) 10 MG tablet Take 1 tablet (10 mg total) by mouth daily. For depression 01/15/17   Armandina Stammer I, NP  magnesium hydroxide (MILK OF MAGNESIA) 400 MG/5ML suspension Take 30 mLs by mouth daily as needed for mild constipation. Patient not taking: Reported on 05/26/2017 01/14/17   Armandina Stammer I, NP  nicotine (NICODERM CQ - DOSED IN MG/24 HOURS) 14 mg/24hr patch Place 1 patch (14 mg total) onto the skin daily. For smoking ceasation Patient not taking: Reported on 05/26/2017 01/15/17   Armandina Stammer I, NP  Oxcarbazepine (TRILEPTAL) 300 MG tablet  Take 1 tablet (300 mg total) by mouth 2 (two) times daily. For mood stabilization 01/14/17   Armandina StammerNwoko, Agnes I, NP  risperiDONE (RISPERDAL) 1 MG tablet Take 1 tablet (1 mg total) by mouth at bedtime. For mood control 01/14/17   Armandina StammerNwoko, Agnes I, NP  tetrahydrozoline 0.05 % ophthalmic solution Place 2 drops into both eyes as needed (redness).    [provider]  traZODone (DESYREL) 50 MG tablet Take 1 tablet (50 mg) at bedtime: For sleep. 01/14/17   Armandina StammerNwoko, Agnes I, NP  VENTOLIN HFA 108 (90 Base) MCG/ACT  inhaler Take 2 puffs by mouth as needed for shortness of breath. 05/23/17   [provider]    Family History No family history on file.  Social History Social History   Tobacco Use  . Smoking status: Current Some Day Smoker    Types: Cigars  . Smokeless tobacco: Never Used  Substance Use Topics  . Alcohol use: Yes    Comment: Last use was last week  . Drug use: Yes    Frequency: 14.0 times per week    Types: Marijuana    Comment: Reported last use was 05/25/17     Allergies   Bee venom; Apple; Corn-containing products; Lac bovis; and Other   Review of Systems Review of Systems  Constitutional: Negative for chills and fever.  HENT: Negative for congestion, rhinorrhea and sore throat.   Eyes: Negative for visual disturbance.  Respiratory: Negative for cough and shortness of breath.   Cardiovascular: Negative for chest pain.  Gastrointestinal: Negative for abdominal pain, diarrhea, nausea and vomiting.  Genitourinary: Negative for dysuria and frequency.  Musculoskeletal: Negative for arthralgias and myalgias.  Skin: Negative for rash and wound.  Neurological: Negative for dizziness, weakness and headaches.  Psychiatric/Behavioral: Positive for dysphoric mood and suicidal ideas.     Physical Exam Updated Vital Signs BP (!) 117/56   Pulse (!) 53   Temp 98.5 F (36.9 C)   Resp 18   SpO2 99%   Physical Exam  Constitutional: He appears well-developed and well-nourished. No distress.  HENT:  Head: Normocephalic and atraumatic.  Eyes: Right eye exhibits no discharge. Left eye exhibits no discharge.  Neck: Neck supple.  Cardiovascular: Normal rate, regular rhythm and normal heart sounds.  Pulmonary/Chest: Effort normal and breath sounds normal. No respiratory distress.  Abdominal: Soft. Bowel sounds are normal. He exhibits no distension. There is no tenderness.  Musculoskeletal: He exhibits no edema or deformity.  Neurological: He is alert. Coordination  normal.  Moving all extremities without difficulty  Skin: Skin is warm and dry. He is not diaphoretic.  Psychiatric: His affect is blunt. His speech is delayed. He is not slowed and not withdrawn. He expresses impulsivity. He exhibits a depressed mood. He expresses homicidal and suicidal ideation. He expresses suicidal plans and homicidal plans.  Nursing note and vitals reviewed.    ED Treatments / Results  Labs (all labs ordered are listed, but only abnormal results are displayed) Labs Reviewed  COMPREHENSIVE METABOLIC PANEL  ETHANOL  SALICYLATE LEVEL  ACETAMINOPHEN LEVEL  CBC  RAPID URINE DRUG SCREEN, HOSP PERFORMED    EKG  EKG Interpretation None       Radiology No results found.  Procedures Procedures (including critical care time)  Medications Ordered in ED Medications - No data to display   Initial Impression / Assessment and Plan / ED Course  I have reviewed the triage vital signs and the nursing notes.  Pertinent labs & imaging results  that were available during my care of the patient were reviewed by me and considered in my medical decision making (see chart for details).  Patient presents for evaluation of suicidal and homicidal thoughts.  Patient denies specific plan, but reports "homicide would be the way to my suicide that would be the ticket" patient endorses homicidal thoughts toward his family.  Patient also endorses hearing voices, which is been more frequent recently.  No medical complaints, no self-injurious behavior ingestions.  On exam vitals are normal and patient is well-appearing, no focal findings on exam.  Medical screening labs pending.  Patient will need evaluation by TTS, given very concerning endorsement of homicidal and suicidal ideations.  Medically cleared at this time, TTS consult placed, awaiting TTS recommendations.  Final Clinical Impressions(s) / ED Diagnoses   Final diagnoses:  Suicidal ideations  Homicidal ideations  Auditory  hallucination    ED Discharge Orders    None       Dartha Lodge, [place] 09/20/17 1518    Pricilla Loveless, MD 09/20/17 [phone]

## 2017-09-20 NOTE — Progress Notes (Signed)
Pt given night time meds not given by ED.

## 2017-09-20 NOTE — [facility] Counselor (Signed)
Clinician completed pt's voluntary support paperwork and [facility].    Redmond Pullingreylese D Crosley Stejskal, MS, [facility], [facility] Triage Specialist [phone]

## 2017-09-20 NOTE — ED Notes (Signed)
EDPA Provider at bedside. 

## 2017-09-21 ENCOUNTER — Other Ambulatory Visit: Payer: Self-pay

## 2017-09-21 ENCOUNTER — Encounter (HOSPITAL_COMMUNITY): Payer: Self-pay

## 2017-09-21 DIAGNOSIS — Z560 Unemployment, unspecified: Secondary | ICD-10-CM

## 2017-09-21 DIAGNOSIS — F250 Schizoaffective disorder, bipolar type: Principal | ICD-10-CM

## 2017-09-21 DIAGNOSIS — Z599 Problem related to housing and economic circumstances, unspecified: Secondary | ICD-10-CM

## 2017-09-21 DIAGNOSIS — R45851 Suicidal ideations: Secondary | ICD-10-CM

## 2017-09-21 DIAGNOSIS — Z813 Family history of other psychoactive substance abuse and dependence: Secondary | ICD-10-CM

## 2017-09-21 DIAGNOSIS — F17210 Nicotine dependence, cigarettes, uncomplicated: Secondary | ICD-10-CM

## 2017-09-21 DIAGNOSIS — Z818 Family history of other mental and behavioral disorders: Secondary | ICD-10-CM

## 2017-09-21 DIAGNOSIS — F1910 Other psychoactive substance abuse, uncomplicated: Secondary | ICD-10-CM

## 2017-09-21 DIAGNOSIS — R45850 Homicidal ideations: Secondary | ICD-10-CM

## 2017-09-21 MED ORDER — RISPERIDONE 2 MG PO TABS
2.0000 mg | ORAL_TABLET | Freq: Every day | ORAL | Status: DC
  Administered 2017-09-21 – 2017-09-25 (×5): 2 mg via ORAL
  Filled 2017-09-21 (×4): qty 1
  Filled 2017-09-21: qty 7
  Filled 2017-09-21 (×3): qty 1

## 2017-09-21 NOTE — Tx Team (Signed)
Initial Treatment Plan 09/21/2017 12:06 AM Romie Minusameron Benak ZOX:[id number]    PATIENT STRESSORS: Financial difficulties Marital or family conflict Occupational concerns   PATIENT STRENGTHS: Wellsite geologistCommunication skills General fund of knowledge Motivation for treatment/growth Physical Health   PATIENT IDENTIFIED PROBLEMS: Depression  Suicidal/homicial ideation  "I need help with my temperament"  "I need help with positive thinking"               DISCHARGE CRITERIA:  Improved stabilization in mood, thinking, and/or behavior Verbal commitment to aftercare and medication compliance  PRELIMINARY DISCHARGE PLAN: Outpatient therapy Medication management  PATIENT/FAMILY INVOLVEMENT: This treatment plan has been presented to and reviewed with the patient, Romie MinusCameron Grassia.  The patient and family have been given the opportunity to ask questions and make suggestions.  Levin BaconHeather V Othmar Ringer, RN 09/21/2017, 12:06 AM

## 2017-09-21 NOTE — [facility] Suicide Risk Assessment (Signed)
[facility] Admission Suicide Risk Assessment   Nursing information obtained from:  Patient Demographic factors:  Male, Adolescent or young adult, Unemployed Current Mental Status:  Suicidal ideation indicated by patient Loss Factors:  Decrease in vocational status, Financial problems / change in socioeconomic status Historical Factors:  Family history of suicide, Family history of mental illness or substance abuse Risk Reduction Factors:  Living with another person, especially a relative  Total Time spent with patient: 45 minutes Principal Problem: Schizoaffective disorder, bipolar type (HCC) Diagnosis:   Patient Active Problem List   Diagnosis Date Noted  . Schizoaffective disorder, bipolar type (HCC) [F25.0] 01/09/2017  . Intermittent explosive disorder [F63.81] 05/13/2016  . Intoxication by drug [facility]) [W29.562] 07/12/2014  . Intoxication [IMO0002] 07/12/2014  . Depression [F32.9] 07/12/2014  . Overdose of antitussive [T48.3X1A]    Subjective Data: 22 y.o AAM, single, lives with his grand-mother, unemployed, no kids, no relationship. Background history of Schizoaffective disorder, SUD and IED. Patient attends out patient group on our unit. He presented in company of his counselor. He was reported to have expressed suicidal and homicidal thoughts towards his family members. UDS is positive for THC. No alcohol or any other substance. Labs are essentially normal. Patient is not pervasively depressed. He is not manic. He is not psychotic. He has fantasy of harming is family and then harm himself. No past suicidal behavior. No cognitive impairment. No access to weapons. He is cooperative with care. He has agreed to treatment recommendations. He has agreed to communicate suicidal thoughts to staff if the thoughts becomes overwhelming.      Continued Clinical Symptoms:  Alcohol Use Disorder Identification Test Final Score (AUDIT): 0 The "Alcohol Use Disorders Identification Test", Guidelines for  Use in Primary Care, Second Edition.  [employer] [facility]). Score between 0-7:  no or low risk or alcohol related problems. Score between 8-15:  moderate risk of alcohol related problems. Score between 16-19:  high risk of alcohol related problems. Score 20 or above:  warrants further diagnostic evaluation for alcohol dependence and treatment.   CLINICAL FACTORS:   Alcohol/Substance Abuse/Dependencies   Musculoskeletal: Strength & Muscle Tone: within normal limits Gait & Station: normal Patient leans: N/A  Psychiatric Specialty Exam: Physical Exam  ROS  Blood pressure 128/67, pulse 97, temperature 98 F (36.7 C), temperature source Oral, resp. rate 20, height 6\' 1"  (1.854 m), weight 83.9 kg (185 lb).Body mass index is 24.41 kg/m.  General Appearance: As in H&P  Eye Contact:    Speech:    Volume:    Mood:    Affect:    Thought Process:    Orientation:    Thought Content:    Suicidal Thoughts:    Homicidal Thoughts:    Memory:  As in H&P  Judgement:    Insight:    Psychomotor Activity:    Concentration:    Recall:    Fund of Knowledge:    Language:    Akathisia:    Handed:    AIMS (if indicated):     Assets:    ADL's:    Cognition:  As in H&P  Sleep:  Number of Hours: 5.75      COGNITIVE FEATURES THAT CONTRIBUTE TO RISK:  None    SUICIDE RISK:   Moderate:  Frequent suicidal ideation with limited intensity, and duration, some specificity in terms of plans, no associated intent, good self-control, limited dysphoria/symptomatology, some risk factors present, and identifiable protective factors, including available and accessible social support.  PLAN  OF CARE:  As in H&P  I certify that inpatient services furnished can reasonably be expected to improve the patient's condition.   Georgiann CockerVincent A Dontay Harm, MD 09/21/2017, 11:33 AM

## 2017-09-21 NOTE — [facility] Group Notes (Signed)
LCSW Group Therapy Note  09/21/2017 10:30-11:30AM  Type of Therapy and Topic:  Group Therapy: Anger Cues and Responses  Participation Level:  Active   Description of Group:   In this group, patients learned how to recognize the physical, cognitive, emotional, and behavioral responses they have to anger-provoking situations.  They identified a recent time they became angry and how they reacted.  They analyzed how their reaction was possibly beneficial and how it was possibly unhelpful.  The group dicussed a variety of healthier coping skills that could help with such a situation in the future.  Deep breathing was practiced briefly.  Therapeutic Goals: 1. Patients will remember their last incident of anger and how they felt emotionally and physically, what their thoughts were at the time, and how they behaved. 2. Patients will identify how their behavior at that time worked for them, as well as how it worked against them. 3. Patients will explore possible new behaviors to use in future anger situations. 4. Patients will learn that anger itself is normal and cannot be eliminated, and that healthier reactions can assist with resolving conflict rather than worsening situations.  Summary of Patient Progress:  The patient shared that their most recent time of anger was earlier today.  Most of his responses were theoretical and hard to follow, rather than being specific examples.  He shared that when he gets angry he will lie in the floor growling, but said also that he feels completely in control.  He was pleasant when CSW challenged this thought.  Therapeutic Modalities:   Cognitive Behavioral Therapy  Lynnell ChadMareida J Grossman-Orr  09/21/2017 12:52 PM
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
## 2017-09-21 NOTE — Progress Notes (Signed)
Lawrence [name] is a 22 year old male being admitted voluntarily to [facility] from [facility].  He came in after having suicidal and homicidal thoughts during [facility].  He reported argument with his parents as the precursor to the SI/HI thoughts.  [facility] he reported hearing voices but they are more like "dark thoughts."  He reported his SI/HI thoughts are getting better and he will contract for safety on the unit.  He denies any pain or discomfort and appears to be in no physical distress.  Oriented him to the unit.  Admission paperwork completed and signed.  Belongings searched and secured in locker # 52, no contraband found.  Skin assessment completed and no skin issues noted.  Q 15 minute checks initiated for safety.  We will continue to monitor the progress towards his goals.

## 2017-09-21 NOTE — Progress Notes (Signed)
Adult Psychoeducational Group Note  Date:  09/21/2017 Time:  11:05 PM  Group Topic/Focus:  Wrap-Up Group:   The focus of this group is to help patients review their daily goal of treatment and discuss progress on daily workbooks.  Participation Level:  Active  Participation Quality:  Appropriate  Affect:  Appropriate  Cognitive:  Appropriate  Insight: Appropriate  Engagement in Group:  Engaged  Modes of Intervention:  Discussion  Additional Comments:  Patient attended group and participated.   Garet Hooton W Fleet Higham 09/21/2017, 11:05 PM

## 2017-09-22 DIAGNOSIS — F1211 Cannabis abuse, in remission: Secondary | ICD-10-CM

## 2017-09-22 DIAGNOSIS — Z6379 Other stressful life events affecting family and household: Secondary | ICD-10-CM

## 2017-09-22 DIAGNOSIS — F1994 Other psychoactive substance use, unspecified with psychoactive substance-induced mood disorder: Secondary | ICD-10-CM

## 2017-09-22 NOTE — Progress Notes (Signed)
Date: 09/22/2017 Time:  09/22/2017  Group Topic/Focus:  Progressive Relaxation  Psychoeducational Group Note  Date: 09/22/2017 Time:  09/22/2017  Group Topic/Focus: Progressive Relaxation. Purpose of this group is to teach progressive relaxation that the Pt can Use anywhere at anytime. Along with this Deep breathing techniques  Participation Level:  Partisipated  Participation Quality:  Involved  Affect:  appropriate  Cognitive:  appropriate  Insight:  appropriate  Engagement in Group:  Pt engaged in the group  Additional Comments:  Pt attended and participated in all the exercises   Lawrence [name] A Date: 09/22/2017 Time:  09/22/2017  *  Lawrence [name] A  

## 2017-09-22 NOTE — [facility] Group Notes (Signed)
[facility] Group Notes:  (Nursing/MHT/Case Management/Adjunct)  Date:  09/22/2017  Time:  8:00 PM  Type of Therapy:  Nurse Education  Participation Level:  Active  Participation Quality:  Attentive  Affect:  Depressed  Cognitive:  Appropriate  Insight:  Appropriate  Engagement in Group:  Engaged  Modes of Intervention:  Education  Summary of Progress/Problems:The purpose of the group was to teach patients how to set SMART goals.  Lawrence BraveDuke, Lawrence [name] Lawrence [name] 09/22/2017, 8:00 PM

## 2017-09-22 NOTE — [facility] Group Notes (Signed)
[facility] LCSW Group Therapy Note  Date/Time:  09/22/2017 10:00-11:00AM  Type of Therapy and Topic:  Group Therapy:  Healthy and Unhealthy Supports  Participation Level:  Minimal   Description of Group:  Patients in this group were introduced to the idea of adding a variety of healthy supports to address the various needs in their lives.Patients discussed what additional healthy supports could be helpful in their recovery and wellness after discharge in order to prevent future hospitalizations.   An emphasis was placed on using counselor, doctor, therapy groups, 12-step groups, and problem-specific support groups to expand supports.  They also worked as a group on developing a specific plan for several patients to deal with unhealthy supports through boundary-setting, psychoeducation with loved ones, and even termination of relationships.   Therapeutic Goals:   1)  discuss importance of adding supports to stay well once out of the hospital  2)  compare healthy versus unhealthy supports and identify some examples of each  3)  generate ideas and descriptions of healthy supports that can be added  4)  offer mutual support about how to address unhealthy supports  5)  encourage active participation in and adherence to discharge plan    Summary of Patient Progress:  The patient expressed a willingness to add more friends that are encouraging and uplifting to help in his recovery journey.  He was open to getting suggestions from other group members for how to go about doing this.   Therapeutic Modalities:   Motivational Interviewing Brief Solution-Focused Therapy  Lawrence MantleMareida Grossman-Orr, LCSW

## 2017-09-22 NOTE — Progress Notes (Signed)
Patient has been up and active on the unit, attended group this evening and has voiced no complaints. Writer informed him of scheduled medications and dosage change.  Patient currently denies having pain, -si/hi/a/v hall. Support and encouragement offered, safety maintained on unit, will continue to monitor.

## 2017-09-22 NOTE — Progress Notes (Signed)
[facility]  09/22/2017 4:44 PM Lawrence [name]  MRN:  [id number] Subjective:   22 y.o AAM, single, lives with his grand-mother, unemployed, no kids, no relationship. Background history of Schizoaffective disorder, SUD and IED. Patient attends out patient group on our unit. He presented in company of his counselor. He was reported to have expressed suicidal and homicidal thoughts towards his family members. UDS is positive for THC. No alcohol or any other substance. Labs are essentially normal.  Chart reviewed today. Patient discussed at team today.  Staff reports he has been interacting well with peers. No behavioral issues. He slept well last night. He has not voiced any futility thoughts today.  Seen today. Says he is doing well. Very vague about being angry towards his family. Still has fantasy of making them suffer. Says he would actually enjoy to watch them die. No specific plans. No suicidal thoughts at this time. Tolerating his medication well. Wants to know if he could get the depot. No evidence of psychosis. No evidence of mania.   Principal Problem: Schizoaffective disorder, bipolar type (HCC) Diagnosis:   Patient Active Problem List   Diagnosis Date Noted  . Schizoaffective disorder, bipolar type (HCC) [F25.0] 01/09/2017  . Intermittent explosive disorder [F63.81] 05/13/2016  . Intoxication by drug [facility](HCC) [V42.595][F19.929] 07/12/2014  . Intoxication [IMO0002] 07/12/2014  . Depression [F32.9] 07/12/2014  . Overdose of antitussive [T48.3X1A]    Total Time spent with patient: 20 minutes  Past Psychiatric History: As in H&P  Past Medical History:  Past Medical History:  Diagnosis Date  . ADD (attention deficit disorder)   . Asthma   . Depressed   . Schizoaffective disorder (HCC)    History reviewed. No pertinent surgical history. Family History: History reviewed. No pertinent family history. Family Psychiatric  History: As in H&P Social History:  Social History    Substance and Sexual Activity  Alcohol Use Yes   Comment: Last use was last week     Social History   Substance and Sexual Activity  Drug Use Yes  . Frequency: 14.0 times per week  . Types: Marijuana   Comment: Reported last use was 05/25/17    Social History   Socioeconomic History  . Marital status: Single    Spouse name: None  . Number of children: None  . Years of education: None  . Highest education level: None  Social Needs  . Financial resource strain: None  . Food insecurity - worry: None  . Food insecurity - inability: None  . Transportation needs - medical: None  . Transportation needs - non-medical: None  Occupational History  . None  Tobacco Use  . Smoking status: Current Some Day Smoker    Types: Cigars  . Smokeless tobacco: Never Used  Substance and Sexual Activity  . Alcohol use: Yes    Comment: Last use was last week  . Drug use: Yes    Frequency: 14.0 times per week    Types: Marijuana    Comment: Reported last use was 05/25/17  . Sexual activity: No  Other Topics Concern  . None  Social History Narrative  . None   Additional Social History:                         Sleep: Good  Appetite:  Good  Current Medications: Current Facility-Administered Medications  Medication Dose Route Frequency Provider Last Rate Last Dose  . alum & mag hydroxide-simeth (MAALOX/MYLANTA) 200-200-20 MG/5ML suspension  30 mL  30 mL Oral Q4H PRN Charm Rings, NP      . escitalopram (LEXAPRO) tablet 10 mg  10 mg Oral Daily Charm Rings, NP   10 mg at 09/22/17 0817  . magnesium hydroxide (MILK OF MAGNESIA) suspension 30 mL  30 mL Oral Daily PRN Charm Rings, NP      . Oxcarbazepine (TRILEPTAL) tablet 300 mg  300 mg Oral BID Charm Rings, NP   300 mg at 09/22/17 0817  . risperiDONE (RISPERDAL) tablet 2 mg  2 mg Oral QHS Demetre Monaco, Delight Ovens, MD   2 mg at 09/21/17 2157  . traZODone (DESYREL) tablet 50 mg  50 mg Oral QHS Charm Rings, NP   50 mg  at 09/21/17 2157    Lab Results: No results found for this or any previous visit (from the past 48 hour(s)).  Blood Alcohol level:  Lab Results  Component Value Date   ETH <10 09/20/2017   ETH <10 05/26/2017    Metabolic Disorder Labs: No results found for: HGBA1C, MPG No results found for: PROLACTIN No results found for: CHOL, TRIG, HDL, CHOLHDL, VLDL, LDLCALC  Physical Findings: AIMS: Facial and Oral Movements Muscles of Facial Expression: None, normal Lips and Perioral Area: None, normal Jaw: None, normal Tongue: None, normal,Extremity Movements Upper (arms, wrists, hands, fingers): None, normal Lower (legs, knees, ankles, toes): None, normal, Trunk Movements Neck, shoulders, hips: None, normal, Overall Severity Severity of abnormal movements (highest score from questions above): None, normal Incapacitation due to abnormal movements: None, normal Patient's awareness of abnormal movements (rate only patient's report): No Awareness, Dental Status Current problems with teeth and/or dentures?: No Does patient usually wear dentures?: No  CIWA:    COWS:     Musculoskeletal: Strength & Muscle Tone: within normal limits Gait & Station: normal Patient leans: N/A  Psychiatric Specialty Exam: Physical Exam  Constitutional: He appears well-developed and well-nourished.  HENT:  Head: Normocephalic and atraumatic.  Respiratory: Effort normal.  Neurological: He is alert.  Psychiatric:  As above    ROS  Blood pressure 107/61, pulse 88, temperature 98.4 F (36.9 C), temperature source Oral, resp. rate 20, height 6\' 1"  (1.854 m), weight 83.9 kg (185 lb).Body mass index is 24.41 kg/m.  General Appearance: Groomed, pleasant and cooperative.  Eye Contact:  Good  Speech:  Clear and Coherent and Normal Rate  Volume:  Normal  Mood:  Feels good  Affect:  Appropriate and Full Range  Thought Process:  Linear  Orientation:  Full (Time, Place, and Person)  Thought Content:   Fantasies about harming is family.   Suicidal Thoughts:  None currently  Homicidal Thoughts:  Very vague  Memory:  Immediate;   Good Recent;   Good Remote;   Good  Judgement:  Fair  Insight:  Partial  Psychomotor Activity:  Normal  Concentration:  Concentration: Good and Attention Span: Good  Recall:  Good  Fund of Knowledge:  Good  Language:  Good  Akathisia:  Negative  Handed:    AIMS (if indicated):     Assets:  Communication Skills Physical Health Resilience  ADL's:  Intact  Cognition:  WNL  Sleep:  Number of Hours: 6.75     Treatment Plan Summary:  Patient is tolerating recent medication adjustment much better today. We would evaluate him further before LAI   Psychiatric: Schizoaffective disorder SUD IED ?Malingering  Medical:  Psychosocial:  Financial constraints  Unemployed Limited support  PLAN: 1. Continue current regimen  2. Monitor mood, behavior and interaction with peers   Georgiann Cocker, MD 09/22/2017, 4:44 PM

## 2017-09-22 NOTE — [facility] Group Notes (Signed)
[facility] Group Notes:  (Nursing/MHT/Case Management/Adjunct)  Date:  09/22/2017  Time:  8:31 PM  Type of Therapy:  Nurse Education   Participation Level:  Active  Participation Quality:  Attentive  Affect:  Appropriate  Cognitive:  Alert  Insight:  Good  Engagement in Group:  Engaged  Modes of Intervention:  Education  Summary of Progress/Problems:The group focuses on teaching patients how to identify their eneds and then hwo to develop skills needed to get their needs met  Lauralyn Primes 09/22/2017, 8:31 PM

## 2017-09-22 NOTE — Progress Notes (Addendum)
D Patient is seen UAL on the 400 hall today..he tolerates this well today. HE is VERY sleepy this morning and he tells writer " "I don't know what happened...those medicines just kicked my butt last night.Marland Kitchen.Marland Kitchen.I am so sleepy...still. " A He completed his daily assessment and on this he wrote he deneid SI today and he rated is depression , hopelessness and anxeity " 7/6/6", respectively. R Pt reprots he has a home to go to and includes this in hsi dc plan.He shares personal feelings he expereineced ...realted to his admission mentla health status  and he is encouraged by this writer to cont to process his feelings and work on Electrical engineerhealthier coping skills.

## 2017-09-23 MED ORDER — RISPERIDONE MICROSPHERES 25 MG IM SUSR
25.0000 mg | INTRAMUSCULAR | Status: DC
  Administered 2017-09-23: 25 mg via INTRAMUSCULAR
  Filled 2017-09-23 (×4): qty 2

## 2017-09-23 NOTE — B[facility] Group Notes (Signed)
Adult Psychoeducational Group Note  Date:  09/23/2017 Time:  2:12 PM  Group Topic/Focus:  Wellness Toolbox:   The focus of this group is to discuss various aspects of wellness, balancing those aspects and exploring ways to increase the ability to experience wellness.  Patients will create a wellness toolbox for use upon discharge.  Participation Level:  Active  Participation Quality:  Appropriate  Affect:  Appropriate  Cognitive:  Appropriate  Insight: Appropriate  Engagement in Group:  Engaged  Modes of Intervention:  Discussion  Additional Comments:  Pt was able to talk openly about his daily bouts with depression and how he's able to explore alternative ways to combat his depressive symptoms. Pt shared that he wants to utilize what he has learned here and apply it to his life on a daily basis.    Lawrence AspenLatoya O [name] [name] 09/23/2017, 2:12 PM

## 2017-09-23 NOTE — Tx Team (Signed)
Interdisciplinary Treatment and Diagnostic Plan Update  09/23/2017 Time of Session: 10:00a Lawrence [name] MRN: [id number]  Principal Diagnosis: Schizoaffective disorder, bipolar type [facility])  Secondary Diagnoses: Principal Problem:   Schizoaffective disorder, bipolar type ([place])   Current Medications:  Current Facility-Administered Medications  Medication Dose Route Frequency Provider Last Rate Last Dose  . alum & mag hydroxide-simeth (MAALOX/MYLANTA) 200-200-20 MG/5ML suspension 30 mL  30 mL Oral Q4H PRN Patrecia Pour, NP      . escitalopram (LEXAPRO) tablet 10 mg  10 mg Oral Daily Patrecia Pour, NP   10 mg at 09/23/17 0817  . magnesium hydroxide (MILK OF MAGNESIA) suspension 30 mL  30 mL Oral Daily PRN Patrecia Pour, NP      . Oxcarbazepine (TRILEPTAL) tablet 300 mg  300 mg Oral BID Patrecia Pour, NP   300 mg at 09/23/17 0817  . risperiDONE (RISPERDAL) tablet 2 mg  2 mg Oral QHS Izediuno, Laruth Bouchard, MD   2 mg at 09/22/17 2145  . traZODone (DESYREL) tablet 50 mg  50 mg Oral QHS Patrecia Pour, NP   50 mg at 09/22/17 2145   PTA Medications: Medications Prior to Admission  Medication Sig Dispense Refill Last Dose  . EPINEPHrine (EPIPEN JR) 0.15 MG/0.3ML injection Inject 0.15 mg into the muscle.   ON HAND  . escitalopram (LEXAPRO) 10 MG tablet Take 1 tablet (10 mg total) by mouth daily. For depression 30 tablet 0 09/20/2017 at Unknown time  . Oxcarbazepine (TRILEPTAL) 300 MG tablet Take 1 tablet (300 mg total) by mouth 2 (two) times daily. For mood stabilization 60 tablet 0 09/20/2017 at Unknown time  . tetrahydrozoline 0.05 % ophthalmic solution Place 2 drops into both eyes as needed (redness).   Past Month at Unknown time  . traZODone (DESYREL) 50 MG tablet Take 1 tablet (50 mg) at bedtime: For sleep. 30 tablet 0 Past Week at Unknown time  . VENTOLIN HFA 108 (90 Base) MCG/ACT inhaler Take 2 puffs by mouth as needed for shortness of breath.  3 Not Taking at Unknown time    Patient  Stressors: Financial difficulties Marital or family conflict Occupational concerns  Patient Strengths: Curator fund of knowledge Motivation for treatment/growth Physical Health  Treatment Modalities: Medication Management, Group therapy, Case management,  1 to 1 session with clinician, Psychoeducation, Recreational therapy.   Physician Treatment Plan for Primary Diagnosis: Schizoaffective disorder, bipolar type ([place]) Long Term Goal(s): Improvement in symptoms so as ready for discharge Improvement in symptoms so as ready for discharge   Short Term Goals: Ability to identify changes in lifestyle to reduce recurrence of condition will improve Ability to verbalize feelings will improve Ability to disclose and discuss suicidal ideas Ability to demonstrate self-control will improve Ability to identify and develop effective coping behaviors will improve Ability to maintain clinical measurements within normal limits will improve Compliance with prescribed medications will improve Ability to identify triggers associated with substance abuse/mental health issues will improve Ability to identify changes in lifestyle to reduce recurrence of condition will improve Ability to verbalize feelings will improve Ability to disclose and discuss suicidal ideas Ability to demonstrate self-control will improve Ability to identify and develop effective coping behaviors will improve Ability to maintain clinical measurements within normal limits will improve Compliance with prescribed medications will improve Ability to identify triggers associated with substance abuse/mental health issues will improve  Medication Management: Evaluate patient's response, side effects, and tolerance of medication regimen.  Therapeutic Interventions: 1 to 1  sessions, Unit Group sessions and Medication administration.  Evaluation of Outcomes: Not Met  Physician Treatment Plan for Secondary Diagnosis:  Principal Problem:   Schizoaffective disorder, bipolar type ([place])  Long Term Goal(s): Improvement in symptoms so as ready for discharge Improvement in symptoms so as ready for discharge   Short Term Goals: Ability to identify changes in lifestyle to reduce recurrence of condition will improve Ability to verbalize feelings will improve Ability to disclose and discuss suicidal ideas Ability to demonstrate self-control will improve Ability to identify and develop effective coping behaviors will improve Ability to maintain clinical measurements within normal limits will improve Compliance with prescribed medications will improve Ability to identify triggers associated with substance abuse/mental health issues will improve Ability to identify changes in lifestyle to reduce recurrence of condition will improve Ability to verbalize feelings will improve Ability to disclose and discuss suicidal ideas Ability to demonstrate self-control will improve Ability to identify and develop effective coping behaviors will improve Ability to maintain clinical measurements within normal limits will improve Compliance with prescribed medications will improve Ability to identify triggers associated with substance abuse/mental health issues will improve     Medication Management: Evaluate patient's response, side effects, and tolerance of medication regimen.  Therapeutic Interventions: 1 to 1 sessions, Unit Group sessions and Medication administration.  Evaluation of Outcomes: Not Met   RN Treatment Plan for Primary Diagnosis: Schizoaffective disorder, bipolar type ([place]) Long Term Goal(s): Knowledge of disease and therapeutic regimen to maintain health will improve  Short Term Goals: Ability to remain free from injury will improve, Ability to verbalize frustration and anger appropriately will improve, Ability to demonstrate self-control, Ability to participate in decision making will improve, Ability to  verbalize feelings will improve, Ability to disclose and discuss suicidal ideas, Ability to identify and develop effective coping behaviors will improve and Compliance with prescribed medications will improve  Medication Management: RN will administer medications as ordered by provider, will assess and evaluate patient's response and provide education to patient for prescribed medication. RN will report any adverse and/or side effects to prescribing provider.  Therapeutic Interventions: 1 on 1 counseling sessions, Psychoeducation, Medication administration, Evaluate responses to treatment, Monitor vital signs and CBGs as ordered, Perform/monitor CIWA, COWS, AIMS and Fall Risk screenings as ordered, Perform wound care treatments as ordered.  Evaluation of Outcomes: Not Met   LCSW Treatment Plan for Primary Diagnosis: Schizoaffective disorder, bipolar type ([place]) Long Term Goal(s): Safe transition to appropriate next level of care at discharge, Engage patient in therapeutic group addressing interpersonal concerns.  Short Term Goals: Engage patient in aftercare planning with referrals and resources, Increase social support, Increase ability to appropriately verbalize feelings, Increase emotional regulation, Facilitate acceptance of mental health diagnosis and concerns and Increase skills for wellness and recovery  Therapeutic Interventions: Assess for all discharge needs, 1 to 1 time with Social worker, Explore available resources and support systems, Assess for adequacy in community support network, Educate family and significant other(s) on suicide prevention, Complete Psychosocial Assessment, Interpersonal group therapy.  Evaluation of Outcomes: Not Met   Progress in Treatment: Attending groups: Yes. Participating in groups: Yes. Taking medication as prescribed: Yes. Toleration medication: Yes. Family/Significant other contact made: No, will contact:  the patient's mother, [name] Patient understands diagnosis: Yes. Discussing patient identified problems/goals with staff: Yes. Medical problems stabilized or resolved: Yes. Denies suicidal/homicidal ideation: Yes. Issues/concerns per patient self-inventory: Yes. Other:  New problem(s) identified: No, Describe:  none  New Short Term/Long Term Goal(s): medication stabilization,  elimination of SI thoughts, development of comprehensive mental wellness plan.   Patient Goals: "I want to work on my positive thinking"    Discharge Plan or Barriers: Patient will follow up with an outpatient provider for medication management and therapy services. CSW will continue to follow for appropriate referrals.   Reason for Continuation of Hospitalization: Anxiety Homicidal ideation Medication stabilization Suicidal ideation  Estimated Length of Stay:  Attendees: Patient: Lawrence [name] 09/23/2017 9:02 AM  Physician: Leanord Hawking A 09/23/2017 9:02 AM  Nursing: Bayard Hugger, RN  09/23/2017 9:02 AM  RN Care Manager: 09/23/2017 9:02 AM  Social Worker: Radonna Ricker, [place] 09/23/2017 9:02 AM  Recreational Therapist:  09/23/2017 9:02 AM  Other:  09/23/2017 9:02 AM  Other:  09/23/2017 9:02 AM  Other: 09/23/2017 9:02 AM    Scribe for Treatment Team: Marylee Floras, [place] 09/23/2017 9:02 AM

## 2017-09-23 NOTE — [facility] Group Notes (Signed)
[facility] Group Notes:  (Nursing/MHT/Case Management/Adjunct)  Date:  09/23/2017  Time:  1615  Type of Therapy:  Nurse Education - Positive Affirmations and Self Talk  Participation Level:  Active  Participation Quality:  Attentive  Affect:  Blunted and Depressed  Cognitive:  Oriented and Lacking  Insight:  Lacking  Engagement in Group:  Lacking  Modes of Intervention:  Education and Support  Summary of Progress/Problems: Patient attended group and had some participation however began to stare off at floor.  Lawrence MarseillesFriedman, Normon Pettijohn Eakes 09/23/2017, 5:35 PM

## 2017-09-23 NOTE — Progress Notes (Signed)
D: Patient observed up and visible in the milieu. Patient reports he is no longer having SI and HI towards his family today. "I have some really not good thoughts sometimes. I feel my parents have let me down. Today its better though."Patient's affect animated, mood depressed but pleasant. Behavior cooperative.  Per self inventory and discussions with writer, rates depression at a 5/10, hopelessness at a 6/10 and anxiety at a 7/10. Rates sleep as good, appetite as good, energy as normal and concentration as good.  States goal for today is "positivity." Denies pain, physical complaints.   A: Medicated per orders, no prns requested or required. Level III obs in place for safety. Emotional support offered and self inventory reviewed. Encouraged completion of Suicide Safety Plan and programming participation. Discussed POC with MD, SW.   R: Patient verbalizes understanding of POC. Patient denies SI/HI/AVH and remains safe on level III obs. Will continue to monitor closely and make verbal contact frequently.
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
## 2017-09-23 NOTE — Progress Notes (Signed)
Patient has been up and active on the unit, and has voiced no complaints. He is observed up in the dayroom during half time laughing and talking with peers. He report having had a good day.Patient currently denies having pain, -si/hi/a/v hall. Support and encouragement offered, safety maintained on unit, will continue to monitor.

## 2017-09-23 NOTE — Progress Notes (Addendum)
[facility]  09/23/2017 3:50 PM Lawrence MinusCameron Cudworth  MRN:  [id number] Subjective:   22 y.o AAM, single, lives with his grand-mother, unemployed, no kids, no relationship. Background history of Schizoaffective disorder, SUD and IED. Patient attends out patient group on our unit. He presented in company of his counselor. He was reported to have expressed suicidal and homicidal thoughts towards his family members. UDS is positive for THC. No alcohol or any other substance. Labs are essentially normal.  Chart reviewed today. Patient discussed at team today.  Staff reports he has been socializing with peers. No internal stimulation. No behavioral issues. No side effects from his medications.   Seen today. Less perseveration on being wronged by his family. fantasy about harming them is still there but not as intense as before. He is tolerating his medications well. No hallucination in any modality. No delusional preoccupation. No passivity phenomena. No evidence of depression or mania. He has agreed to depot Risperidone today.   Principal Problem: Schizoaffective disorder, bipolar type (HCC) Diagnosis:   Patient Active Problem List   Diagnosis Date Noted  . Schizoaffective disorder, bipolar type (HCC) [F25.0] 01/09/2017  . Intermittent explosive disorder [F63.81] 05/13/2016  . Intoxication by drug [facility](HCC) [W09.811][F19.929] 07/12/2014  . Intoxication [IMO0002] 07/12/2014  . Depression [F32.9] 07/12/2014  . Overdose of antitussive [T48.3X1A]    Total Time spent with patient: 20 minutes  Past Psychiatric History: As in H&P  Past Medical History:  Past Medical History:  Diagnosis Date  . ADD (attention deficit disorder)   . Asthma   . Depressed   . Schizoaffective disorder (HCC)    History reviewed. No pertinent surgical history. Family History: History reviewed. No pertinent family history. Family Psychiatric  History: As in H&P Social History:  Social History   Substance and Sexual Activity   Alcohol Use Yes   Comment: Last use was last week     Social History   Substance and Sexual Activity  Drug Use Yes  . Frequency: 14.0 times per week  . Types: Marijuana   Comment: Reported last use was 05/25/17    Social History   Socioeconomic History  . Marital status: Single    Spouse name: None  . Number of children: None  . Years of education: None  . Highest education level: None  Social Needs  . Financial resource strain: None  . Food insecurity - worry: None  . Food insecurity - inability: None  . Transportation needs - medical: None  . Transportation needs - non-medical: None  Occupational History  . None  Tobacco Use  . Smoking status: Current Some Day Smoker    Types: Cigars  . Smokeless tobacco: Never Used  Substance and Sexual Activity  . Alcohol use: Yes    Comment: Last use was last week  . Drug use: Yes    Frequency: 14.0 times per week    Types: Marijuana    Comment: Reported last use was 05/25/17  . Sexual activity: No  Other Topics Concern  . None  Social History Narrative  . None   Additional Social History:        Sleep: Good  Appetite:  Good  Current Medications: Current Facility-Administered Medications  Medication Dose Route Frequency Provider Last Rate Last Dose  . alum & mag hydroxide-simeth (MAALOX/MYLANTA) 200-200-20 MG/5ML suspension 30 mL  30 mL Oral Q4H PRN Charm RingsLord, Jamison Y, NP      . escitalopram (LEXAPRO) tablet 10 mg  10 mg Oral Daily Lord,  Herminio Heads, NP   10 mg at 09/23/17 0817  . magnesium hydroxide (MILK OF MAGNESIA) suspension 30 mL  30 mL Oral Daily PRN Charm Rings, NP      . Oxcarbazepine (TRILEPTAL) tablet 300 mg  300 mg Oral BID Charm Rings, NP   300 mg at 09/23/17 0817  . risperiDONE (RISPERDAL) tablet 2 mg  2 mg Oral QHS Reese Senk, Delight Ovens, MD   2 mg at 09/22/17 2145  . traZODone (DESYREL) tablet 50 mg  50 mg Oral QHS Charm Rings, NP   50 mg at 09/22/17 2145    Lab Results: No results found for this  or any previous visit (from the past 48 hour(s)).  Blood Alcohol level:  Lab Results  Component Value Date   ETH <10 09/20/2017   ETH <10 05/26/2017    Metabolic Disorder Labs: No results found for: HGBA1C, MPG No results found for: PROLACTIN No results found for: CHOL, TRIG, HDL, CHOLHDL, VLDL, LDLCALC  Physical Findings: AIMS: Facial and Oral Movements Muscles of Facial Expression: None, normal Lips and Perioral Area: None, normal Jaw: None, normal Tongue: None, normal,Extremity Movements Upper (arms, wrists, hands, fingers): None, normal Lower (legs, knees, ankles, toes): None, normal, Trunk Movements Neck, shoulders, hips: None, normal, Overall Severity Severity of abnormal movements (highest score from questions above): None, normal Incapacitation due to abnormal movements: None, normal Patient's awareness of abnormal movements (rate only patient's report): No Awareness, Dental Status Current problems with teeth and/or dentures?: No Does patient usually wear dentures?: No  CIWA:    COWS:     Musculoskeletal: Strength & Muscle Tone: within normal limits Gait & Station: normal Patient leans: N/A  Psychiatric Specialty Exam: Physical Exam  Constitutional: He appears well-developed and well-nourished.  HENT:  Head: Normocephalic and atraumatic.  Respiratory: Effort normal.  Neurological: He is alert.  Psychiatric:  As above    ROS  Blood pressure (!) 107/56, pulse 74, temperature 97.7 F (36.5 C), temperature source Oral, resp. rate 16, height 6\' 1"  (1.854 m), weight 83.9 kg (185 lb).Body mass index is 24.41 kg/m.  General Appearance: Calm and cooperative. No EPS  Eye Contact:  Good  Speech:  Spontaneous, normal prosody. Normal tone and rate.   Volume:  Normal  Mood: Euthymic  Affect:  Appropriate and Full Range  Thought Process:  Linear  Orientation:  Full (Time, Place, and Person)  Thought Content:  Less fantasies about harming is family. No delusional  theme.  No hallucination in any modality.   Suicidal Thoughts:  None currently  Homicidal Thoughts:  Very vague  Memory:  Immediate;   Good Recent;   Good Remote;   Good  Judgement:  Fair  Insight:  Partial  Psychomotor Activity:  Normal  Concentration:  Concentration: Good and Attention Span: Good  Recall:  Good  Fund of Knowledge:  Good  Language:  Good  Akathisia:  Negative  Handed:    AIMS (if indicated):     Assets:  Communication Skills Physical Health Resilience  ADL's:  Intact  Cognition:  WNL  Sleep:  Number of Hours: 6.5     Treatment Plan Summary:  Patient presented with violent thoughts towards his family and himself. He has history of heavy THC use. We have not seen any psychopathology so far. We have increased Risperidone and he would be getting the LAI today. Since we have a duty to warn if discharging him, I would recommend a family session/phone conference prior to discharge. Would  monitor him further for a couple of days.   Psychiatric: Schizoaffective disorder SUD IED ?Malingering  Medical:  Psychosocial:  Financial constraints  Unemployed Limited support  PLAN: 1. Risperidone Consta 25 mg fortnightly  2. Continue to monitor mood, behavior and interaction with peers   Georgiann Cocker, MD 09/23/2017, 3:50 PMPatient ID: Lawrence [name], male   DOB: 02-Nov-1995, 22 y.o.   MRN: [id number]

## 2017-09-23 NOTE — Progress Notes (Signed)
Recreation Therapy Notes  Date: 09/23/17 Time: 0930 Location: 300 Hall Dayroom  Group Topic: Stress Management  Goal Area(s) Addresses:  Patient will verbalize importance of using healthy stress management.  Patient will identify positive emotions associated with healthy stress management.   Intervention: Stress Management  Activity :  Gratitude Meditation.  LRT played meditation from Calm app to allow patients to meditate on being grateful for the things, skills or people in their lives that have had a positive effect on their lives.  Education: Stress Management, Discharge Planning.   Education Outcome: Acknowledges edcuation/In group clarification offered/Needs additional education  Clinical Observations/Feedback: Pt did not attend group.    Lawrence [name] Lawrence [name], LRT/CTRS         Lawrence [name] A 09/23/2017 12:35 PM 

## 2017-09-24 DIAGNOSIS — F419 Anxiety disorder, unspecified: Secondary | ICD-10-CM

## 2017-09-24 DIAGNOSIS — F39 Unspecified mood [affective] disorder: Secondary | ICD-10-CM

## 2017-09-24 DIAGNOSIS — R450 Nervousness: Secondary | ICD-10-CM

## 2017-09-24 DIAGNOSIS — F1290 Cannabis use, unspecified, uncomplicated: Secondary | ICD-10-CM

## 2017-09-24 NOTE — Progress Notes (Signed)
Pt reports he had a good day and feels the medications he is taking are helping his anxiety and mood.  He denies SI/HI/AVH at this time.  He has been in the dayroom talking with peers and watching TV.  He voices no needs or concerns at this time.  Meds given as ordered.  Support and encouragement offered.  Discharge plans are in process.  Safety maintained with q15 minute checks.

## 2017-09-24 NOTE — Progress Notes (Signed)
Adult Psychoeducational Group Note  Date:  09/24/2017 Time:  9:59 PM  Group Topic/Focus:  Wrap-Up Group:   The focus of this group is to help patients review their daily goal of treatment and discuss progress on daily workbooks.  Participation Level:  Active  Participation Quality:  Appropriate  Affect:  Appropriate  Cognitive:  Alert  Insight: Appropriate  Engagement in Group:  Engaged  Modes of Intervention:  Discussion  Additional Comments:  Patient stated having a good day. Patient's goal for today was to think positive.  Briahna Pescador L Hellena Pridgen 09/24/2017, 9:59 PM

## 2017-09-24 NOTE — Progress Notes (Signed)
  D: Patient was pleasant on approach tonight. Reports his mood is improved today. Denies any SI/HI tonight. Received Risperdal consta without any issues tonight. Interacting well with peers. A: Staff will monitor on q 15 minute checks, follow treatment plan, and give meds as ordered. R: Cooperative and took all medication.

## 2017-09-24 NOTE — Progress Notes (Signed)
Pt presents with a flat affect. Pt rates depression 5/10. Anxiety 4/10. Pt denies SI/HI. Pt reports fair sleep last night. Pt verbalized to writer that he received his injection last night so he should be discharging home soon. Pt reports tolerating his meds well. No side effects to meds verbalized by pt. Medications reviewed with pt. Medications administered as ordered per MD. Verbal support provided. Pt encouraged to attend groups. 15 minute checks performed for safety. Pt compliant with tx.

## 2017-09-24 NOTE — [facility] Group Notes (Signed)
LCSW Group Therapy Note 09/24/2017 3:13 PM  Type of Therapy/Topic: Group Therapy: Feelings about Diagnosis  Participation Level: Active   Description of Group:  This group will allow patients to explore their thoughts and feelings about diagnoses they have received. Patients will be guided to explore their level of understanding and acceptance of these diagnoses. Facilitator will encourage patients to process their thoughts and feelings about the reactions of others to their diagnosis and will guide patients in identifying ways to discuss their diagnosis with significant others in their lives. This group will be process-oriented, with patients participating in exploration of their own experiences, giving and receiving support, and processing challenge from other group members.  Therapeutic Goals: 1. Patient will demonstrate understanding of diagnosis as evidenced by identifying two or more symptoms of the disorder 2. Patient will be able to express two feelings regarding the diagnosis 3. Patient will demonstrate their ability to communicate their needs through discussion and/or role play  Summary of Patient Progress:  Patient was engaged throughout the group session. He participated and contributed to the group's discussion regarding feelings about diagnosis.     Therapeutic Modalities:  Cognitive Behavioral Therapy Brief Therapy Feelings Identification    Oretta Berkland Catalina AntiguaWilliams LCSWA Clinical Social Worker
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
## 2017-09-24 NOTE — Progress Notes (Signed)
[facility] MD Progress Note  09/24/2017 3:06 PM Lawrence [name]  MRN:  [id number]   Subjective:  Patient reports that he is doing good today and doesn't even feel depressed today. He denies any SI/HI/AVH and contracts for safety. He feels a little anxious about what will happen when he is discharged. He feels the medications are working well and wishes to continue them.   Objective: Patient's chart and findings reviewed and discussed with treatment team. Patient present sin the day room attending group. He appears pleasant and cooperative. He has an appropriate affect. Will continue the current medications.   Principal Problem: Schizoaffective disorder, bipolar type (HCC) Diagnosis:   Patient Active Problem List   Diagnosis Date Noted  . Schizoaffective disorder, bipolar type (HCC) [F25.0] 01/09/2017  . Intermittent explosive disorder [F63.81] 05/13/2016  . Intoxication by drug [facility]) [W09.811] 07/12/2014  . Intoxication [IMO0002] 07/12/2014  . Depression [F32.9] 07/12/2014  . Overdose of antitussive [T48.3X1A]    Total Time spent with patient: 15 minutes  Past Psychiatric History: See H&P  Past Medical History:  Past Medical History:  Diagnosis Date  . ADD (attention deficit disorder)   . Asthma   . Depressed   . Schizoaffective disorder (HCC)    History reviewed. No pertinent surgical history. Family History: History reviewed. No pertinent family history. Family Psychiatric  History: See H&P Social History:  Social History   Substance and Sexual Activity  Alcohol Use Yes   Comment: Last use was last week     Social History   Substance and Sexual Activity  Drug Use Yes  . Frequency: 14.0 times per week  . Types: Marijuana   Comment: Reported last use was 05/25/17    Social History   Socioeconomic History  . Marital status: Single    Spouse name: None  . Number of children: None  . Years of education: None  . Highest education level: None  Social Needs  . Financial  resource strain: None  . Food insecurity - worry: None  . Food insecurity - inability: None  . Transportation needs - medical: None  . Transportation needs - non-medical: None  Occupational History  . None  Tobacco Use  . Smoking status: Current Some Day Smoker    Types: Cigars  . Smokeless tobacco: Never Used  Substance and Sexual Activity  . Alcohol use: Yes    Comment: Last use was last week  . Drug use: Yes    Frequency: 14.0 times per week    Types: Marijuana    Comment: Reported last use was 05/25/17  . Sexual activity: No  Other Topics Concern  . None  Social History Narrative  . None   Additional Social History:                         Sleep: Good  Appetite:  Good  Current Medications: Current Facility-Administered Medications  Medication Dose Route Frequency Provider Last Rate Last Dose  . alum & mag hydroxide-simeth (MAALOX/MYLANTA) 200-200-20 MG/5ML suspension 30 mL  30 mL Oral Q4H PRN Charm Rings, NP      . escitalopram (LEXAPRO) tablet 10 mg  10 mg Oral Daily Charm Rings, NP   10 mg at 09/24/17 9147  . magnesium hydroxide (MILK OF MAGNESIA) suspension 30 mL  30 mL Oral Daily PRN Charm Rings, NP      . Oxcarbazepine (TRILEPTAL) tablet 300 mg  300 mg Oral BID Charm Rings, NP  300 mg at 09/24/17 0828  . risperiDONE (RISPERDAL) tablet 2 mg  2 mg Oral QHS Izediuno, Delight OvensVincent A, MD   2 mg at 09/23/17 2232  . risperiDONE microspheres (RISPERDAL CONSTA) injection 25 mg  25 mg Intramuscular Q14 Days Izediuno, Delight OvensVincent A, MD   25 mg at 09/23/17 2052  . traZODone (DESYREL) tablet 50 mg  50 mg Oral QHS Charm RingsLord, Jamison Y, NP   50 mg at 09/23/17 2232    Lab Results: No results found for this or any previous visit (from the past 48 hour(s)).  Blood Alcohol level:  Lab Results  Component Value Date   ETH <10 09/20/2017   ETH <10 05/26/2017    Metabolic Disorder Labs: No results found for: HGBA1C, MPG No results found for: PROLACTIN No results  found for: CHOL, TRIG, HDL, CHOLHDL, VLDL, LDLCALC  Physical Findings: AIMS: Facial and Oral Movements Muscles of Facial Expression: None, normal Lips and Perioral Area: None, normal Jaw: None, normal Tongue: None, normal,Extremity Movements Upper (arms, wrists, hands, fingers): None, normal Lower (legs, knees, ankles, toes): None, normal, Trunk Movements Neck, shoulders, hips: None, normal, Overall Severity Severity of abnormal movements (highest score from questions above): None, normal Incapacitation due to abnormal movements: None, normal Patient's awareness of abnormal movements (rate only patient's report): No Awareness, Dental Status Current problems with teeth and/or dentures?: No Does patient usually wear dentures?: No  CIWA:    COWS:     Musculoskeletal: Strength & Muscle Tone: within normal limits Gait & Station: normal Patient leans: N/A  Psychiatric Specialty Exam: Physical Exam  Nursing note and vitals reviewed. Constitutional: He is oriented to person, place, and time. He appears well-developed and well-nourished.  Cardiovascular: Normal rate.  Respiratory: Effort normal.  Musculoskeletal: Normal range of motion.  Neurological: He is alert and oriented to person, place, and time.  Skin: Skin is warm.    Review of Systems  Constitutional: Negative.   HENT: Negative.   Eyes: Negative.   Respiratory: Negative.   Cardiovascular: Negative.   Gastrointestinal: Negative.   Genitourinary: Negative.   Musculoskeletal: Negative.   Skin: Negative.   Neurological: Negative.   Endo/Heme/Allergies: Negative.   Psychiatric/Behavioral: The patient is nervous/anxious.     Blood pressure 110/67, pulse 83, temperature 98.1 F (36.7 C), temperature source Oral, resp. rate 18, height 6\' 1"  (1.854 m), weight 83.9 kg (185 lb).Body mass index is 24.41 kg/m.  General Appearance: Casual  Eye Contact:  Good  Speech:  Clear and Coherent and Normal Rate  Volume:  Normal   Mood:  Euthymic  Affect:  Congruent  Thought Process:  Goal Directed and Descriptions of Associations: Intact  Orientation:  Full (Time, Place, and Person)  Thought Content:  WDL  Suicidal Thoughts:  No  Homicidal Thoughts:  No  Memory:  Immediate;   Good Recent;   Good Remote;   Good  Judgement:  Good  Insight:  Good  Psychomotor Activity:  Normal  Concentration:  Concentration: Good and Attention Span: Good  Recall:  Good  Fund of Knowledge:  Good  Language:  Good  Akathisia:  No  Handed:  Right  AIMS (if indicated):     Assets:  Communication Skills Desire for Improvement Financial Resources/Insurance Housing Physical Health Social Support Transportation  ADL's:  Intact  Cognition:  WNL  Sleep:  Number of Hours: 5.5   Problems Addressed: Schizoaffective  Treatment Plan Summary: Daily contact with patient to assess and evaluate symptoms and progress in treatment, Medication management and Plan  is to:  -Continue Trilleptal 300 mg PO BID for mood stability -Continue Risperidone 2 mg PO QHS for mood stability -Continue Risperdal Consta 25 mg IM Q14 Days for mood stability started on 09-23-17 -Continue Lexapro 10 mg PO Daily for mood stability -Encourage group therapy participation  Maryfrances Bunnell, FNP 09/24/2017, 3:06 PM   Agree with NP Progress Note

## 2017-09-24 NOTE — Progress Notes (Signed)
Pt attended orientation/ goals group. During this group Pt discussed their treatment agreement, went over unit rules, and planned goals for the day.    

## 2017-09-25 DIAGNOSIS — F29 Unspecified psychosis not due to a substance or known physiological condition: Secondary | ICD-10-CM

## 2017-09-25 NOTE — Progress Notes (Addendum)
Recreation Therapy Notes  Date: 09/25/17 Time: 0930 Location: 300 Hall Dayroom  Group Topic: Stress Management  Goal Area(s) Addresses:  Patient will verbalize importance of using healthy stress management.  Patient will identify positive emotions associated with healthy stress management.   Intervention: Stress Management  Activity :  Guided Imagery.  LRT introduced the stress management technique of guided imagery.  LRT read a script that guided patients to envision laying out in the sun on a warm day, enjoying the breeze and sunshine.  Patients were to listen and follow along as script was read.  Education:  Stress Management, Discharge Planning.   Education Outcome: Acknowledges edcuation/In group clarification offered/Needs additional education  Clinical Observations/Feedback: Pt did not attend group.    Caroll RancherMarjette Bomani Oommen, LRT/CTRS         Lillia AbedLindsay, Armella Stogner A 09/25/2017 11:21 AM

## 2017-09-25 NOTE — Progress Notes (Signed)
Adult Psychoeducational Group Note  Date:  09/25/2017 Time:  1345  Group Topic/Focus:  Relaxation Group  Relaxation activity to help cope with anxiety and depression.   Participation Level:  Active  Participation Quality:  Appropriate  Affect:  Appropriate  Cognitive:  Appropriate  Insight: Appropriate  Engagement in Group:  Engaged  Modes of Intervention:  Activity  Additional Comments:    Amauri Keefe L   

## 2017-09-25 NOTE — [facility] Group Notes (Signed)
[facility] [employer] Group Therapy 09/25/2017 1:15pm  Type of Therapy: Mental [employer] Presentation  Participation Level: Active  Participation Quality: Attentive  Affect: Appropriate  Cognitive: Oriented  Insight: Developing/Improving  Engagement in Therapy: Engaged  Modes of Intervention: Discussion, Education and Socialization  Summary of Progress/Problems: Mental [employer] (MHA) Speaker came to talk about his personal journey with mental health. The pt processed ways by which to relate to the speaker. MHA speaker provided handouts and educational information pertaining to groups and services offered by the MHA. Pt was engaged in speaker's presentation and was receptive to resources provided.    Travious Vanover N Smart, LCSW 09/25/2017 11:10 AM  

## 2017-09-25 NOTE — [facility] Group Notes (Signed)
[facility] Group Notes:  (Nursing/MHT/Case Management/Adjunct)  Date:  09/25/2017  Time:  6:44 PM  Type of Therapy:  Psychoeducational Skills  Participation Level:  Active   Summary of Progress/Problems: Pt participated in mindful meditation with the nurse.    Karren CobbleFizah G Archita Lomeli 09/25/2017, 6:44 PM

## 2017-09-25 NOTE — Progress Notes (Signed)
Pt presents with an animated affect and anxious mood. Pt verbalized that he no longer endorses SI/HI. Pt reports decreased anxiety and depression today. Pt rates depression 5/10. Anxiety 5/10. Pt reports fair sleep at bedtime. Pt verbalized that he's tolerating his meds well. No side effects to meds verbalized by pt. Pt reported that he plans to return back to his grandmother's home after discharge. Pt expressed to Clinical research associatewriter that he has not talked with his grandmother about returning back. Pt encouraged to speak with grandmother about d/c plans. Medications administered as ordered per MD. Verbal support provided. Pt encouraged to attend groups. 15 minute checks performed for safety.

## 2017-09-25 NOTE — [facility] Group Notes (Signed)
[facility] Group Notes:  (Nursing/MHT/Case Management/Adjunct)  Date:  09/25/2017  Time:  9:09 AM  Type of Therapy:  orientation/ goals group  Participation Level:  Active  Participation Quality:  Appropriate  Affect:  Appropriate  Cognitive:  Alert  Insight:  Good  Engagement in Group:  Engaged  Modes of Intervention:  Discussion and Education  Summary of Progress/Problems:  Pt participated group. During group pt completed his self-inventory. During group, pts wrote down ten positive self-affirmations. Discussion consisted of personal development. Group addressed why it is important to stop doubting ourselves, and how to go about it. Pt shared two of his affirmations during group. Pt stated that he is working on perfecting his thoughts and that his mind and body hold each other up.   Karren CobbleFizah G Brienna Bass 09/25/2017, 9:09 AM

## 2017-09-25 NOTE — Progress Notes (Signed)
Adult Psychoeducational Group Note  Date:  09/25/2017 Time:  10:41 PM  Group Topic/Focus:  Wrap-Up Group:   The focus of this group is to help patients review their daily goal of treatment and discuss progress on daily workbooks.  Participation Level:  Active  Participation Quality:  Appropriate  Affect:  Appropriate  Cognitive:  Alert  Insight: Appropriate  Engagement in Group:  Engaged  Modes of Intervention:  Discussion  Additional Comments:  Patient stated he was grateful for life. Patient's goal for today was to think positive. Patient stated something positive that happened today was that he found a piece of mind.   Appollonia Klee L Ithiel Liebler 09/25/2017, 10:41 PM

## 2017-09-25 NOTE — Progress Notes (Addendum)
[facility] MD Progress Note  09/25/2017 1:18 PM Lawrence [name]  MRN:  [id number]   Subjective:  Patient reports that he is feeling better. States " my depression and my anxiety are significantly lower ". Denies violent or homicidal ideations towards anyone and specifically also denies any HI towards family. States " being here has helped me take a step back, think, focus on me ". Denies medication side effects.  Objective:  I have discussed case with treatment team and have met with patient. Patient presents pleasant, calm. States his mood is improved and currently minimizes depression.  As he improves he is presenting future oriented, and expresses hope to be discharged soon. Plans to return to live with grandmother. States his is feeling optimistic he will be able to get a job soon, and plans to go to [employer]. Behavior on unit calm, in good control, visible in day room, interactive with peers . Going to groups. At this time denies medication side effects. States he feels they are helping.  With his express consent I spoke with his grandmother on the phone- she corroborates patient seems improved and expresses agreement with disposition planning. She reports he has a history of intermittent explosiveness, which tends to be much better when he is taking his medications as prescribed . I have reviewed importance of medication compliance with patient and have also reviewed potential negative impact of cannabis abuse .  Principal Problem: Schizoaffective disorder, bipolar type ([place]) Diagnosis:   Patient Active Problem List   Diagnosis Date Noted  . Schizoaffective disorder, bipolar type ([place]) [F25.0] 01/09/2017  . Intermittent explosive disorder [F63.81] 05/13/2016  . Intoxication by drug [facility]) [I71.245] 07/12/2014  . Intoxication [IMO0002] 07/12/2014  . Depression [F32.9] 07/12/2014  . Overdose of antitussive [T48.3X1A]    Total Time spent with patient: 20 minutes  Past  Psychiatric History: See H&P  Past Medical History:  Past Medical History:  Diagnosis Date  . ADD (attention deficit disorder)   . Asthma   . Depressed   . Schizoaffective disorder ([place])    History reviewed. No pertinent surgical history. Family History: History reviewed. No pertinent family history. Family Psychiatric  History: See H&P Social History:  Social History   Substance and Sexual Activity  Alcohol Use Yes   Comment: Last use was last week     Social History   Substance and Sexual Activity  Drug Use Yes  . Frequency: 14.0 times per week  . Types: Marijuana   Comment: Reported last use was 05/25/17    Social History   Socioeconomic History  . Marital status: Single    Spouse name: None  . Number of children: None  . Years of education: None  . Highest education level: None  Social Needs  . Financial resource strain: None  . Food insecurity - worry: None  . Food insecurity - inability: None  . Transportation needs - medical: None  . Transportation needs - non-medical: None  Occupational History  . None  Tobacco Use  . Smoking status: Current Some Day Smoker    Types: Cigars  . Smokeless tobacco: Never Used  Substance and Sexual Activity  . Alcohol use: Yes    Comment: Last use was last week  . Drug use: Yes    Frequency: 14.0 times per week    Types: Marijuana    Comment: Reported last use was 05/25/17  . Sexual activity: No  Other Topics Concern  . None  Social History Narrative  . None  Additional Social History:   Sleep: improved  Appetite:  Good  Current Medications: Current Facility-Administered Medications  Medication Dose Route Frequency Provider Last Rate Last Dose  . alum & mag hydroxide-simeth (MAALOX/MYLANTA) 200-200-20 MG/5ML suspension 30 mL  30 mL Oral Q4H PRN Patrecia Pour, NP      . escitalopram (LEXAPRO) tablet 10 mg  10 mg Oral Daily Patrecia Pour, NP   10 mg at 09/25/17 0740  . magnesium hydroxide (MILK OF MAGNESIA)  suspension 30 mL  30 mL Oral Daily PRN Patrecia Pour, NP      . Oxcarbazepine (TRILEPTAL) tablet 300 mg  300 mg Oral BID Patrecia Pour, NP   300 mg at 09/25/17 0740  . risperiDONE (RISPERDAL) tablet 2 mg  2 mg Oral QHS Izediuno, Laruth Bouchard, MD   2 mg at 09/24/17 2127  . risperiDONE microspheres (RISPERDAL CONSTA) injection 25 mg  25 mg Intramuscular Q14 Days Izediuno, Laruth Bouchard, MD   25 mg at 09/23/17 2052  . traZODone (DESYREL) tablet 50 mg  50 mg Oral QHS Patrecia Pour, NP   50 mg at 09/24/17 2127    Lab Results: No results found for this or any previous visit (from the past 48 hour(s)).  Blood Alcohol level:  Lab Results  Component Value Date   ETH <10 09/20/2017   ETH <10 50/53/9767    Metabolic Disorder Labs: No results found for: HGBA1C, MPG No results found for: PROLACTIN No results found for: CHOL, TRIG, HDL, CHOLHDL, VLDL, LDLCALC  Physical Findings: AIMS: Facial and Oral Movements Muscles of Facial Expression: None, normal Lips and Perioral Area: None, normal Jaw: None, normal Tongue: None, normal,Extremity Movements Upper (arms, wrists, hands, fingers): None, normal Lower (legs, knees, ankles, toes): None, normal, Trunk Movements Neck, shoulders, hips: None, normal, Overall Severity Severity of abnormal movements (highest score from questions above): None, normal Incapacitation due to abnormal movements: None, normal Patient's awareness of abnormal movements (rate only patient's report): No Awareness, Dental Status Current problems with teeth and/or dentures?: No Does patient usually wear dentures?: No  CIWA:    COWS:     Musculoskeletal: Strength & Muscle Tone: within normal limits Gait & Station: normal Patient leans: N/A  Psychiatric Specialty Exam: Physical Exam  Nursing note and vitals reviewed. Constitutional: He is oriented to person, place, and time. He appears well-developed and well-nourished.  Cardiovascular: Normal rate.  Respiratory: Effort  normal.  Musculoskeletal: Normal range of motion.  Neurological: He is alert and oriented to person, place, and time.  Skin: Skin is warm.    Review of Systems  Constitutional: Negative.   HENT: Negative.   Eyes: Negative.   Respiratory: Negative.   Cardiovascular: Negative.   Gastrointestinal: Negative.   Genitourinary: Negative.   Musculoskeletal: Negative.   Skin: Negative.   Neurological: Negative.   Endo/Heme/Allergies: Negative.   Psychiatric/Behavioral: The patient is nervous/anxious.   denies headache, no chest pain, no shortness of breath, no vomiting   Blood pressure (!) 110/58, pulse 67, temperature 98.2 F (36.8 C), temperature source Oral, resp. rate 16, height _0  (1.854 m), weight 83.9 kg (185 lb).Body mass index is 24.41 kg/m.  General Appearance: Well Groomed  Eye Contact:  Fair  Speech:  Normal Rate  Volume:  Normal  Mood:  denies feeling depressed, presents euthymic   Affect:  Appropriate and reactive   Thought Process:  Linear and Descriptions of Associations: Intact  Orientation:  Other:  fully alert and attentive  Thought Content:  no hallucinations, no delusions, not internally preoccupied   Suicidal Thoughts:  No currently denies any suicidal or self injurious ideations, denies any homicidal or violent ideations and also specifically denies HI or violent ideations towards family members  Homicidal Thoughts:  No  Memory:  recent and remote grossly intact   Judgement:  Other:  improving   Insight:  improving   Psychomotor Activity:  Normal  Concentration:  Concentration: Good and Attention Span: Good  Recall:  Good  Fund of Knowledge:  Good  Language:  Good  Akathisia:  No  Handed:  Right  AIMS (if indicated):     Assets:  Communication Skills Physical Health Resilience  ADL's:  Intact  Cognition:  WNL  Sleep:  Number of Hours: 6   Assessment- patient reports improved mood and at this time minimizes depression or neuro-vegetative symptoms of  depression. Denies any ongoing violent or homicidal ideations, is future oriented. Denies psychotic symptoms. Behavior calm and in good control at this time. Tolerating medications well .   Treatment Plan Summary: Treatment Plan reviewed as below today 2/6  Daily contact with patient to assess and evaluate symptoms and progress in treatment, Medication management and Plan is to:  -Continue Trilleptal 300 mg PO BID for mood disorder, history of explosiveness   -Continue Risperidone 2 mg PO QHS for mood disorder, psychosis, history of explosiveness  -Received Risperdal Consta 25 mg IM Q14 Days for mood disorder, psychosis, which was started on 09-23-17 -Continue Lexapro 10 mg PO Daily for depression, anxiety -Encourage group therapy participation to work on coping skills and symptom reduction -Treatment team working on disposition planning options  Jenne Campus, MD 09/25/2017, 1:18 PM   Patient ID: Celedonio Savage, male   DOB: Jun 01, 1996, 22 y.o.   MRN: [id number]

## 2017-09-26 MED ORDER — RISPERIDONE MICROSPHERES 25 MG IM SUSR: 25.0000 mg | INTRAMUSCULAR | 0 refills | Status: DC

## 2017-09-26 MED ORDER — ESCITALOPRAM OXALATE 10 MG PO TABS: 10.0000 mg | ORAL_TABLET | Freq: Every day | ORAL | 0 refills | Status: DC

## 2017-09-26 MED ORDER — RISPERIDONE 2 MG PO TABS: 2.0000 mg | ORAL_TABLET | Freq: Every day | ORAL | 0 refills | Status: DC

## 2017-09-26 MED ORDER — OXCARBAZEPINE 300 MG PO TABS: 300.0000 mg | ORAL_TABLET | Freq: Two times a day (BID) | ORAL | 0 refills | Status: DC

## 2017-09-26 MED ORDER — TRAZODONE HCL 50 MG PO TABS: 50.0000 mg | ORAL_TABLET | Freq: Every day | ORAL | 0 refills | Status: DC

## 2017-09-26 NOTE — Progress Notes (Signed)
  [facility] :  Will you be returning to the same living situation after discharge:  Yes,  with grandmother At discharge, do you have transportation home?: Yes,  own car Do you have the ability to pay for your medications: No.  Release of information consent forms completed and in the chart;  Patient's signature needed at discharge.  Patient to Follow up at: Follow-up Information    [facility]. Go on 10/01/2017.   Specialty:  Behavioral Health Why:  Please attend your appt on Tuesday, 10/01/17, at 9:30am. Contact information: [address] [place] [address] [phone]           Next level of care provider [facility]:no  Safety Planning and Suicide Prevention discussed: Yes,  mother  Have you used any form of tobacco in the last 30 days? (Cigarettes, Smokeless Tobacco, Cigars, and/or Pipes): Yes  Has patient been referred to the Quitline?: Yes, faxed on 09/26/17  Patient has been referred for addiction treatment: Yes  Lorri FrederickWierda, Tamya Denardo Jon, LCSW 09/26/2017, 9:40 AM

## 2017-09-26 NOTE — Progress Notes (Signed)
Pt says he is doing well and is ready for discharge.  He denies SI/HI/AVH.  He voices no needs or concerns to Clinical research associatewriter.  He has been attending groups and participating.  He makes his needs known to staff.  Support and encouragement offered.  Discharge plans are in process.  Safety maintained with q15 minute checks.

## 2017-09-26 NOTE — Progress Notes (Signed)
Lawrence [name] was D/C'd from the unit to lobby with plan to drive home.  He was pleasant and cooperative. He voiced no SI/HI or A/V halluciations.  He denies any pain or discomfort.  D/C instructions and medications reviewed with pt.  Pt. verbalized understanding of medications and d/c instructions.   All belongings (from locker 52) returned to pt. Q 15 min checks maintained until discharge.  Lawrence [name] left the unit in no apparent distress.

## 2017-09-26 NOTE — Discharge Summary (Signed)
Physician Discharge Summary Note  Patient:  Lawrence [name] is an 22 y.o., male MRN:  [id number] DOB:  04/13/1996 Patient phone:  [phone] (home)  [address] address:   [address] [place] [address],   Total Time spent with patient: Greater than 30 minutes  Date of Admission:  09/20/2017  Date of Discharge: 09-26-17  Reason for Admission: Suicidal & homicidal threats.  Principal Problem: Schizoaffective disorder, bipolar-type.  Discharge Diagnoses: Patient Active Problem List   Diagnosis Date Noted  . Schizoaffective disorder, bipolar type ([facility]) [F25.0] 01/09/2017  . Intermittent explosive disorder [F63.81] 05/13/2016  . Intoxication by drug [facility]) [G29.528][F19.929] 07/12/2014  . Intoxication [IMO0002] 07/12/2014  . Depression [F32.9] 07/12/2014  . Overdose of antitussive [T48.3X1A]    Past Psychiatric History: Intermittent Explosive disorder, Schizoaffective disorder, bipolar type.  Past Medical History:  Past Medical History:  Diagnosis Date  . ADD (attention deficit disorder)   . Asthma   . Depressed   . Schizoaffective disorder ([facility])    History reviewed. No pertinent surgical history. Family History: History reviewed. No pertinent family history.  Family Psychiatric  History: See H&P  Social History:  Social History   Substance and Sexual Activity  Alcohol Use Yes   Comment: Last use was last week     Social History   Substance and Sexual Activity  Drug Use Yes  . Frequency: 14.0 times per week  . Types: Marijuana   Comment: Reported last use was 05/25/17    Social History   Socioeconomic History  . Marital status: Single    Spouse name: None  . Number of children: None  . Years of education: None  . Highest education level: None  Social Needs  . Financial resource strain: None  . Food insecurity - worry: None  . Food insecurity - inability: None  . Transportation needs - medical: None  . Transportation needs - non-medical: None  Occupational  History  . None  Tobacco Use  . Smoking status: Current Some Day Smoker    Types: Cigars  . Smokeless tobacco: Never Used  Substance and Sexual Activity  . Alcohol use: Yes    Comment: Last use was last week  . Drug use: Yes    Frequency: 14.0 times per week    Types: Marijuana    Comment: Reported last use was 05/25/17  . Sexual activity: No  Other Topics Concern  . None  Social History Narrative  . None   Hospital Course: ([facility] admission assessment). 22 y.o AAM, single, lives with his grand-mother, unemployed, no kids, no relationship. Background history of Schizoaffective disorder, SUD and IED. Patient attends out patient group on our unit. He presented in company of his counselor. He was reported to have expressed suicidal and homicidal thoughts towards his family members. UDS is positive for THC. No alcohol or any other substance. Labs are essentially normal.  After evaluation of his presenting symptoms, Lawrence LangCameron was started on the medication regimen for his presenting symptoms. He was medicated & discharged on; Lexapro 10 mg for depression, Trileptal 300 mg for mood stabilization, Risperdal 2 mg for mood control, Risperdal Microspheres injectable 25 mg IM Q 14 days for mood control & Trazodone 50 mg for insomnia. He was enrolled in the group counseling sessions being offered & held on this unit. He learned coping skills. Lawrence [name]'s symptoms responded well to his treatment regimen. This is evidenced by his reports of improved mood & absence of suicidal ideations. Part of his discharge plans is a  follow-up appointment & referral to an outpatient clinic for further psychiatric care & medication management. He was provided with all the necessary information needed to make this appointment without problems.   Seen today for discharge evaluation by the attending psychiatrist. Lawrence [name] reports that he is in good spirits. Not feeling depressed. Reports normal energy and interest. Has been  maintaining normal biological functions. He is able to think clearly. He is able to focus on task. His thoughts are not crowded or racing. No evidence of mania. He is not making any delusional statement. He is fully in touch with reality. No thoughts of suicide. No thoughts of homicide. No violent thoughts. No overwhelming anxiety.  The nursing staff reports that patient has been appropriate on the unit. Patient has been interacting well with peers. No behavioral issues. Patient has not voiced any suicidal thoughts. Patient has not been observed to be internally stimulated. Patient has been adherent with treatment recommendations. Patient has been tolerating his medication well.   Patient was discussed at the treatment team meeting this morning. The team members feel that patient is back to his baseline level of function. Team agrees with plan to discharge patient today to continue mental health care on an outpatient basis as noted below. Upon discharge, Lawrence [name] presents both mentally & medically stable. He left [facility] with all belongings in no apparent distress. He received a 7 days worth, supply samples of his [facility] discharge medications. Transportation per self.  Physical Findings: AIMS: Facial and Oral Movements Muscles of Facial Expression: None, normal Lips and Perioral Area: None, normal Jaw: None, normal Tongue: None, normal,Extremity Movements Upper (arms, wrists, hands, fingers): None, normal Lower (legs, knees, ankles, toes): None, normal, Trunk Movements Neck, shoulders, hips: None, normal, Overall Severity Severity of abnormal movements (highest score from questions above): None, normal Incapacitation due to abnormal movements: None, normal Patient's awareness of abnormal movements (rate only patient's report): No Awareness, Dental Status Current problems with teeth and/or dentures?: No Does patient usually wear dentures?: No  CIWA:    COWS:     Musculoskeletal: Strength & Muscle  Tone: within normal limits Gait & Station: normal Patient leans: N/A  Psychiatric Specialty Exam: Physical Exam  Constitutional: He appears well-developed.  HENT:  Head: Normocephalic.  Eyes: Pupils are equal, round, and reactive to light.  Cardiovascular: Normal rate.  Respiratory: Effort normal.  GI: Soft.  Genitourinary:  Genitourinary Comments: Deferred  Musculoskeletal: Normal range of motion.  Neurological: He is alert.  Skin: Skin is warm.    Review of Systems  Constitutional: Negative.   HENT: Negative.   Eyes: Negative.   Respiratory: Negative.   Cardiovascular: Negative.   Gastrointestinal: Negative.   Genitourinary: Negative.   Musculoskeletal: Negative.   Skin: Negative.   Neurological: Negative.   Endo/Heme/Allergies: Negative.   Psychiatric/Behavioral: Positive for depression (Stable) and substance abuse (Hx. Cannabis use disorder). Negative for hallucinations, memory loss and suicidal ideas. The patient has insomnia (Stable). The patient is not nervous/anxious.     Blood pressure (!) 111/55, pulse (!) 103, temperature 98 F (36.7 C), temperature source Oral, resp. rate 16, height 6\' 1"  (1.854 m), weight 83.9 kg (185 lb).Body mass index is 24.41 kg/m.  See Md's SRA   Have you used any form of tobacco in the last 30 days? (Cigarettes, Smokeless Tobacco, Cigars, and/or Pipes): Yes  Has this patient used any form of tobacco in the last 30 days? (Cigarettes, Smokeless Tobacco, Cigars, and/or Pipes): NA  Blood Alcohol level:  Lab Results  Component Value Date   ETH <10 09/20/2017   ETH <10 05/26/2017   Metabolic Disorder Labs:  No results found for: HGBA1C, MPG No results found for: PROLACTIN No results found for: CHOL, TRIG, HDL, CHOLHDL, VLDL, LDLCALC  See Psychiatric Specialty Exam and Suicide Risk Assessment completed by Attending Physician prior to discharge.  Discharge destination:  Home  Is patient on multiple antipsychotic therapies at  discharge:  No   Has Patient had three or more failed trials of antipsychotic monotherapy by history:  No  Recommended Plan for Multiple Antipsychotic Therapies: NA  Allergies as of 09/26/2017      Reactions   Bee Venom Anaphylaxis   Apple    Corn-containing Products    Lac Bovis Itching   Milk Related   Other    Green Beans:  Other reaction(s): ASTHMA, RASH      Medication List    STOP taking these medications   EPINEPHrine 0.15 MG/0.3ML injection Commonly known as:  EPIPEN JR   tetrahydrozoline 0.05 % ophthalmic solution   VENTOLIN HFA 108 (90 Base) MCG/ACT inhaler Generic drug:  albuterol     TAKE these medications     Indication  escitalopram 10 MG tablet Commonly known as:  LEXAPRO Take 1 tablet (10 mg total) by mouth daily. For depression Start taking on:  09/27/2017  Indication:  Major Depressive Disorder   Oxcarbazepine 300 MG tablet Commonly known as:  TRILEPTAL Take 1 tablet (300 mg total) by mouth 2 (two) times daily. For mood stabilization  Indication:  Mood stabilization   risperiDONE 2 MG tablet Commonly known as:  RISPERDAL Take 1 tablet (2 mg total) by mouth at bedtime. For mood control  Indication:  Mood control   risperiDONE microspheres 25 MG injection Commonly known as:  RISPERDAL CONSTA Inject 2 mLs (25 mg total) into the muscle every 14 (fourteen) days. (Due on 09-26-17): For mood control Start taking on:  10/07/2017  Indication:  Mood control   traZODone 50 MG tablet Commonly known as:  DESYREL Take 1 tablet (50 mg total) by mouth at bedtime. For sleep What changed:    how much to take  how to take this  when to take this  additional instructions  Indication:  Trouble Sleeping      Follow-up Information    [facility]. Go on 10/01/2017.   Specialty:  Behavioral Health Why:  Please attend your appt on Tuesday, 10/01/17, at 9:30am. Contact information: [address] [place] [address] [phone]          Follow-up  recommendations: Activity:  As tolerated Diet: As recommended by your primary care doctor. Keep all scheduled follow-up appointments as recommended.    Comments: Patient is instructed prior to discharge to: Take all medications as prescribed by his/her mental healthcare provider. Report any adverse effects and or reactions from the medicines to his/her outpatient provider promptly. Patient has been instructed & cautioned: To not engage in alcohol and or illegal drug use while on prescription medicines. In the event of worsening symptoms, patient is instructed to call the crisis hotline, 911 and or go to the nearest ED for appropriate evaluation and treatment of symptoms. To follow-up with his/her primary care provider for your other medical issues, concerns and or health care needs.   Signed: Armandina Stammer, NP, PMHNP, FNP-BC 09/26/2017, 10:38 AM Patient seen, Suicide Assessment Completed.  Disposition Plan Reviewed

## 2017-09-26 NOTE — [facility] Suicide Risk Assessment (Signed)
[facility] Discharge Suicide Risk Assessment   Principal Problem:  Intermittent Explosive Disorder  Discharge Diagnoses:  Patient Active Problem List   Diagnosis Date Noted  . Schizoaffective disorder, bipolar type (HCC) [F25.0] 01/09/2017  . Intermittent explosive disorder [F63.81] 05/13/2016  . Intoxication by drug [facility]) [Z61.096] 07/12/2014  . Intoxication [IMO0002] 07/12/2014  . Depression [F32.9] 07/12/2014  . Overdose of antitussive [T48.3X1A]     Total Time spent with patient: 30 minutes  Musculoskeletal: Strength & Muscle Tone: within normal limits Gait & Station: normal Patient leans: N/A  Psychiatric Specialty Exam: ROS no headache, no chest pain, no shortness of breath, no vomiting   Blood pressure 115/70, pulse 91, temperature 98 F (36.7 C), temperature source Oral, resp. rate 16, height 6\' 1"  (1.854 m), weight 83.9 kg (185 lb).Body mass index is 24.41 kg/m.  General Appearance: Well Groomed  Eye Contact::  Good  Speech:  Normal Rate409  Volume:  Normal  Mood:  patient reports " I am in a good mood", presents euthymic   Affect:  Appropriate and Full Range  Thought Process:  Linear and Descriptions of Associations: Intact  Orientation:  Other:  fully alert and attentive  Thought Content:  denies halllucinations, no delusions, not internally preoccupied   Suicidal Thoughts:  No denies any suicidal or self injurious ideations, denies any homicidal or violent ideations, and also specifically denies any violent or homicidal ideations towards his family members  Homicidal Thoughts:  No  Memory:  recent and remote grossly intact   Judgement:  Other:  improving   Insight:  improving   Psychomotor Activity:  Normal  Concentration:  Good  Recall:  Good  Fund of Knowledge:Good  Language: Good  Akathisia:  Negative  Handed:  Right  AIMS (if indicated):     Assets:  Communication Skills Desire for Improvement Resilience  Sleep:  Number of Hours: 6.25  Cognition: WNL   ADL's:  Intact   Mental Status Per Nursing Assessment::   On Admission:  Suicidal ideation indicated by patient  Demographic Factors:  22 year old single male, lives with mother and grandmother, unemployed   Loss Factors: Unemployment , family stressors  Historical Factors: Prior psychiatric admissions, in the past has been diagnosed with Schizoaffective Disorder and with Intermittent Explosive Disorder   Risk Reduction Factors:   Sense of responsibility to family, Living with another person, especially a relative, Positive social support and Positive coping skills or problem solving skills  Continued Clinical Symptoms:  At this time patient is alert, attentive, well related , calm, pleasant at this time. Presents euthymic, affect is appropriate and reactive, no thought disorder noted, denies suicidal or self injurious ideations, denies any homicidal or violent ideations,specifically denies any homicidal or violent ideations towards his grandmother or mother, denies hallucinations, no delusions, not internally preoccupied. At this time he is future oriented, plans to go Vocational Rehab with the purpose of getting help in entering workforce . At this time presents calm, pleasant on approach.  Denies medication side effects. Denies medication side effects. We reviewed importance of adequate compliance with medications and reviewed side effects, including risk of sedation, movement disorders, metabolic disorders on Risperidone, and potential increased risk of suicidal ideations early in treatment with antidepressants in young adults  * with his express consent I spoke with his grandmother this AM, she corroborates he presents improved, and is ion agreement with his being discharged.  Cognitive Features That Contribute To Risk:  Patient will be admitted to inpatient psychiatric unit for  stabilization and safety. Will provide and encourage milieu participation. Provide medication management and  maked adjustments as needed.  Will follow daily.    Suicide Risk:  Mild:  Suicidal ideation of limited frequency, intensity, duration, and specificity.  There are no identifiable plans, no associated intent, mild dysphoria and related symptoms, good self-control (both objective and subjective assessment), few other risk factors, and identifiable protective factors, including available and accessible social support.  Follow-up Information    [facility]. Go on 10/01/2017.   Specialty:  Behavioral Health Why:  Please attend your appt on Tuesday, 10/01/17, at 9:30am. Contact information: [address] [place] [address] [phone]           Plan Of Care/Follow-up recommendations:  Activity:  as tolerated  Diet:  regular  Tests:  NA Other:  As below  Patient is reporting improvement and expressing readiness for discharge- he is leaving unit in good spirits  Plans to return home Plans to follow up as above  We discussed importance of medication compliance and also reviewed potential negative impact cannabis can have on psychiatric illness and symptoms, recommended he abstain from illicit drugs .   Craige CottaFernando A Aaleeyah Bias, MD 09/26/2017, 9:34 AM

## 2018-12-14 ENCOUNTER — Encounter (HOSPITAL_COMMUNITY): Payer: Self-pay | Admitting: *Deleted

## 2018-12-14 ENCOUNTER — Other Ambulatory Visit: Payer: Self-pay

## 2018-12-14 ENCOUNTER — Ambulatory Visit (HOSPITAL_COMMUNITY)
Admission: RE | Admit: 2018-12-14 | Discharge: 2018-12-14 | Disposition: A | Discharge status: Home / Self Care | Payer: No Typology Code available for payment source | Source: Home / Self Care | Attending: Psychiatry | Admitting: Psychiatry

## 2018-12-14 ENCOUNTER — Inpatient Hospital Stay (HOSPITAL_COMMUNITY)
Admission: AD | Admit: 2018-12-14 | Discharge: 2018-12-18 | DRG: 885 | Disposition: A | Discharge status: Home / Self Care | Payer: No Typology Code available for payment source | Attending: Psychiatry | Admitting: Psychiatry

## 2018-12-14 ENCOUNTER — Encounter (HOSPITAL_COMMUNITY): Payer: Self-pay | Admitting: Behavioral Health

## 2018-12-14 DIAGNOSIS — Z9114 Patient's other noncompliance with medication regimen: Secondary | ICD-10-CM

## 2018-12-14 DIAGNOSIS — T43596A Underdosing of other antipsychotics and neuroleptics, initial encounter: Secondary | ICD-10-CM | POA: Diagnosis present

## 2018-12-14 DIAGNOSIS — Z87892 Personal history of anaphylaxis: Secondary | ICD-10-CM

## 2018-12-14 DIAGNOSIS — Z560 Unemployment, unspecified: Secondary | ICD-10-CM

## 2018-12-14 DIAGNOSIS — Z79899 Other long term (current) drug therapy: Secondary | ICD-10-CM

## 2018-12-14 DIAGNOSIS — Z91128 Patient's intentional underdosing of medication regimen for other reason: Secondary | ICD-10-CM

## 2018-12-14 DIAGNOSIS — F419 Anxiety disorder, unspecified: Secondary | ICD-10-CM | POA: Diagnosis present

## 2018-12-14 DIAGNOSIS — Z91030 Bee allergy status: Secondary | ICD-10-CM

## 2018-12-14 DIAGNOSIS — J45909 Unspecified asthma, uncomplicated: Secondary | ICD-10-CM | POA: Diagnosis present

## 2018-12-14 DIAGNOSIS — F909 Attention-deficit hyperactivity disorder, unspecified type: Secondary | ICD-10-CM | POA: Diagnosis present

## 2018-12-14 DIAGNOSIS — F6381 Intermittent explosive disorder: Secondary | ICD-10-CM | POA: Diagnosis present

## 2018-12-14 DIAGNOSIS — F1010 Alcohol abuse, uncomplicated: Secondary | ICD-10-CM | POA: Diagnosis present

## 2018-12-14 DIAGNOSIS — Y92009 Unspecified place in unspecified non-institutional (private) residence as the place of occurrence of the external cause: Secondary | ICD-10-CM

## 2018-12-14 DIAGNOSIS — R45851 Suicidal ideations: Secondary | ICD-10-CM | POA: Diagnosis present

## 2018-12-14 DIAGNOSIS — Z91011 Allergy to milk products: Secondary | ICD-10-CM

## 2018-12-14 DIAGNOSIS — F17210 Nicotine dependence, cigarettes, uncomplicated: Secondary | ICD-10-CM | POA: Diagnosis present

## 2018-12-14 DIAGNOSIS — Z91018 Allergy to other foods: Secondary | ICD-10-CM

## 2018-12-14 DIAGNOSIS — F250 Schizoaffective disorder, bipolar type: Principal | ICD-10-CM | POA: Diagnosis present

## 2018-12-14 DIAGNOSIS — G4700 Insomnia, unspecified: Secondary | ICD-10-CM | POA: Diagnosis present

## 2018-12-14 DIAGNOSIS — F1220 Cannabis dependence, uncomplicated: Secondary | ICD-10-CM | POA: Diagnosis present

## 2018-12-14 LAB — COMPREHENSIVE METABOLIC PANEL
ALT: 23 U/L (ref 0–44)
AST: 26 U/L (ref 15–41)
Albumin: 4.1 g/dL (ref 3.5–5.0)
Alkaline Phosphatase: 57 U/L (ref 38–126)
Anion gap: 5 (ref 5–15)
BUN: 8 mg/dL (ref 6–20)
CO2: 29 mmol/L (ref 22–32)
Calcium: 9 mg/dL (ref 8.9–10.3)
Chloride: 106 mmol/L (ref 98–111)
Creatinine, Ser: 1.22 mg/dL (ref 0.61–1.24)
GFR calc Af Amer: >60 mL/min (ref >60)
GFR calc non Af Amer: >60 mL/min (ref >60)
Glucose, Bld: 80 mg/dL (ref 70–99)
Potassium: 4 mmol/L (ref 3.5–5.1)
Sodium: 140 mmol/L (ref 135–145)
Total Bilirubin: 0.6 mg/dL (ref 0.3–1.2)
Total Protein: 6.6 g/dL (ref 6.5–8.1)

## 2018-12-14 LAB — CBC
HCT: 43.5 % (ref 39.0–52.0)
Hemoglobin: 14.6 g/dL (ref 13.0–17.0)
MCH: 31.2 pg (ref 26.0–34.0)
MCHC: 33.6 g/dL (ref 30.0–36.0)
MCV: 92.9 fL (ref 80.0–100.0)
Platelets: 183 10*3/uL (ref 150–400)
RBC: 4.68 MIL/uL (ref 4.22–5.81)
RDW: 12.6 % (ref 11.5–15.5)
WBC: 8 10*3/uL (ref 4.0–10.5)
nRBC: 0 % (ref 0.0–0.2)

## 2018-12-14 LAB — RAPID URINE DRUG SCREEN, HOSP PERFORMED
Amphetamines: NOT DETECTED
Barbiturates: NOT DETECTED
Benzodiazepines: NOT DETECTED
Cocaine: NOT DETECTED
Opiates: NOT DETECTED
Tetrahydrocannabinol: POSITIVE — AB

## 2018-12-14 LAB — LIPID PANEL
Cholesterol: 106 mg/dL (ref 0–200)
HDL: 49 mg/dL (ref >40)
LDL Cholesterol: 50 mg/dL (ref 0–99)
Total CHOL/HDL Ratio: 2.2 RATIO
Triglycerides: 33 mg/dL (ref <150)
VLDL: 7 mg/dL (ref 0–40)

## 2018-12-14 LAB — TSH: TSH: 2.259 u[IU]/mL (ref 0.350–4.500)

## 2018-12-14 MED ORDER — RISPERIDONE 2 MG PO TABS
2.0000 mg | ORAL_TABLET | Freq: Every day | ORAL | Status: DC
  Administered 2018-12-14 – 2018-12-15 (×2): 2 mg via ORAL
  Filled 2018-12-14 (×2): qty 1

## 2018-12-14 MED ORDER — RISPERIDONE 2 MG PO TBDP: 2.0000 mg | ORAL_TABLET | Freq: Three times a day (TID) | ORAL | Status: DC | PRN

## 2018-12-14 MED ORDER — RISPERIDONE 2 MG PO TABS
ORAL_TABLET | ORAL | Status: AC
  Filled 2018-12-14: qty 1

## 2018-12-14 MED ORDER — MAGNESIUM HYDROXIDE 400 MG/5ML PO SUSP: 30.0000 mL | Freq: Every day | ORAL | Status: DC | PRN

## 2018-12-14 MED ORDER — TRAZODONE HCL 50 MG PO TABS
50.0000 mg | ORAL_TABLET | Freq: Every day | ORAL | Status: DC
  Administered 2018-12-14 – 2018-12-15 (×2): 50 mg via ORAL
  Filled 2018-12-14 (×3): qty 1

## 2018-12-14 MED ORDER — TRAZODONE HCL 50 MG PO TABS
ORAL_TABLET | ORAL | Status: AC
  Administered 2018-12-14: 50 mg via ORAL
  Filled 2018-12-14: qty 1

## 2018-12-14 MED ORDER — ZIPRASIDONE MESYLATE 20 MG IM SOLR: 20.0000 mg | INTRAMUSCULAR | Status: DC | PRN

## 2018-12-14 MED ORDER — LORAZEPAM 1 MG PO TABS: 1.0000 mg | ORAL_TABLET | ORAL | Status: DC | PRN

## 2018-12-14 MED ORDER — ALUM & MAG HYDROXIDE-SIMETH 200-200-20 MG/5ML PO SUSP: 30.0000 mL | ORAL | Status: DC | PRN

## 2018-12-14 MED ORDER — HYDROXYZINE HCL 25 MG PO TABS
25.0000 mg | ORAL_TABLET | Freq: Three times a day (TID) | ORAL | Status: DC | PRN
  Administered 2018-12-14 – 2018-12-16 (×3): 25 mg via ORAL
  Filled 2018-12-14 (×4): qty 1

## 2018-12-14 NOTE — Progress Notes (Signed)
[place] NOVEL CORONAVIRUS (COVID-19) DAILY CHECK-OFF SYMPTOMS - answer yes or no to each - every day NO YES  Have you had a fever in the past 24 hours?  . Fever (Temp > 37.80C / 100F) X   Have you had any of these symptoms in the past 24 hours? . New Cough .  Sore Throat  .  Shortness of Breath .  Difficulty Breathing .  Unexplained Body Aches   X   Have you had any one of these symptoms in the past 24 hours not related to allergies?   . Runny Nose .  Nasal Congestion .  Sneezing   X   If you have had runny nose, nasal congestion, sneezing in the past 24 hours, has it worsened?  X   EXPOSURES - check yes or no X   Have you traveled outside the state in the past 14 days?  X   Have you been in contact with someone with a confirmed diagnosis of COVID-19 or PUI in the past 14 days without wearing appropriate PPE?  X   Have you been living in the same home as a person with confirmed diagnosis of COVID-19 or a PUI (household contact)?    X   Have you been diagnosed with COVID-19?    X              What to do next: Answered NO to all: Answered YES to anything:   Proceed with unit schedule Follow the [facility] Inpatient Flowsheet.   

## 2018-12-14 NOTE — H&P (Signed)
BH Observation Unit Provider Admission PAA/H&P  Patient Identification: Lawrence [name] MRN:  [id number] Date of Evaluation:  12/14/2018 Chief Complaint:  Schizoeffective disorder Principal Diagnosis: Schizoaffective disorder, bipolar type (HCC) Diagnosis:  Principal Problem:   Schizoaffective disorder, bipolar type (HCC)  History of Present Illness: Lawrence [name] is a 23 year old male who [facility] as a walk-in asking for help with his anger outbursts and irritability. He also stated he has passive suicidal thought without a specific plan. He denies homicidal ideation, auditory and visual hallucinations and paranoia. He does appear to be responding to internal stimuli and his mood is non-congruent; laughing inappropriately and speaking of others as if they could hear him. He has strong body odor. He stated he lives with his grandmother and stated he "is sick of the people and trying top practice that he is ok." He speaks in a low tone and requested that this visit happen without medications. He did agree to take Risperdal PO until he can speak to the doctor tomorrow. [facility] and was [facility]. He was discharged on Riperdal Consta, Risperdal PO and Trileptal. He stopped taking them shortly after discharge. Pt stated he was willing to stay at the hospital to get help. Basic labs were ordered. Pt will be admitted to the OBS unit for safety reasons and for medication restart. Pt will be moved to inpatient when a bed becomes available.    Associated Signs/Symptoms: Depression Symptoms:  depressed mood, insomnia, feelings of worthlessness/guilt, difficulty concentrating, hopelessness, suicidal thoughts without plan, anxiety, loss of energy/fatigue, disturbed sleep, (Hypo) Manic Symptoms:  Distractibility, Hallucinations, Irritable Mood, Anxiety Symptoms:  Excessive Worry, Psychotic Symptoms:  Hallucinations: Auditory PTSD  Symptoms: Negative Total Time spent with patient: 30 minutes  Past Psychiatric History: Schizoaffective Disorder  Is the patient at risk to self? Yes.    Has the patient been a risk to self in the past 6 months? No.  Has the patient been a risk to self within the distant past? No.  Is the patient a risk to others? Yes.    Has the patient been a risk to others in the past 6 months? No.  Has the patient been a risk to others within the distant past? No.   Prior Inpatient Therapy:  Yes Prior Outpatient Therapy:  Unknown  Alcohol Screening:   Substance Abuse History in the last 12 months:  No.denies Consequences of Substance Abuse: NA Previous Psychotropic Medications: Yes  Psychological Evaluations: No  Past Medical History:  Past Medical History:  Diagnosis Date  . ADD (attention deficit disorder)   . Asthma   . Depressed   . Schizoaffective disorder (HCC)    No past surgical history on file. Family History: No family history on file. Family Psychiatric History: Pt did not give this information Tobacco Screening:   Social History:  Social History   Substance and Sexual Activity  Alcohol Use Yes   Comment: Last use was last week     Social History   Substance and Sexual Activity  Drug Use Yes  . Frequency: 14.0 times per week  . Types: Marijuana   Comment: Last use 12/13/2018    Additional Social History:           Allergies:   Allergies  Allergen Reactions  . Bee Venom Anaphylaxis  . Apple   . Corn-Containing Products   . Lac Bovis Itching    Milk Related  . Other  Green Beans:   Other reaction(s): ASTHMA, RASH   Lab Results: No results found for this or any previous visit (from the past 48 hour(s)).  Blood Alcohol level:  Lab Results  Component Value Date   ETH <10 09/20/2017   ETH <10 05/26/2017    Metabolic Disorder Labs:  No results found for: HGBA1C, MPG No results found for: PROLACTIN No results found for: CHOL, TRIG, HDL, CHOLHDL,  VLDL, LDLCALC  Current Medications: Current Facility-Administered Medications  Medication Dose Route Frequency Provider Last Rate Last Dose  . alum & mag hydroxide-simeth (MAALOX/MYLANTA) 200-200-20 MG/5ML suspension 30 mL  30 mL Oral Q4H PRN Laveda Abbe, NP      . hydrOXYzine (ATARAX/VISTARIL) tablet 25 mg  25 mg Oral TID PRN Laveda Abbe, NP      . magnesium hydroxide (MILK OF MAGNESIA) suspension 30 mL  30 mL Oral Daily PRN Laveda Abbe, NP       PTA Medications: Medications Prior to Admission  Medication Sig Dispense Refill Last Dose  . escitalopram (LEXAPRO) 10 MG tablet Take 1 tablet (10 mg total) by mouth daily. For depression 30 tablet 0   . Oxcarbazepine (TRILEPTAL) 300 MG tablet Take 1 tablet (300 mg total) by mouth 2 (two) times daily. For mood stabilization 60 tablet 0   . risperiDONE (RISPERDAL) 2 MG tablet Take 1 tablet (2 mg total) by mouth at bedtime. For mood control 30 tablet 0   . risperiDONE microspheres (RISPERDAL CONSTA) 25 MG injection Inject 2 mLs (25 mg total) into the muscle every 14 (fourteen) days. (Due on 09-26-17): For mood control 1 each 0   . traZODone (DESYREL) 50 MG tablet Take 1 tablet (50 mg total) by mouth at bedtime. For sleep 30 tablet 0    Psychiatric Specialty Exam: Physical Exam  Constitutional: He appears well-developed and well-nourished.  HENT:  Head: Normocephalic.  Respiratory: Effort normal.  Musculoskeletal: Normal range of motion.  Neurological: He is alert.  Psychiatric: His speech is normal and behavior is normal. Judgment normal. His mood appears anxious. Thought content is delusional. Cognition and memory are normal. He exhibits a depressed mood.    Review of Systems  Psychiatric/Behavioral: Positive for depression and hallucinations. The patient is nervous/anxious.   All other systems reviewed and are negative.   There were no vitals taken for this visit.There is no height or weight on file to calculate  BMI.  General Appearance: Casual  Eye Contact:  Fair  Speech:  Clear and Coherent  Volume:  Normal  Mood:  Anxious and Depressed  Affect:  Non-Congruent and Constricted  Thought Process:  Disorganized  Orientation:  Full (Time, Place, and Person)  Thought Content:  Illogical and Hallucinations: Auditory  Suicidal Thoughts:  No  Homicidal Thoughts:  No  Memory:  Immediate;   Fair Recent;   Fair  Judgement:  Impaired  Insight:  Shallow  Psychomotor Activity:  Normal  Concentration: Concentration: Fair  Recall:  [employer] of Knowledge:Fair  Language: Good  Akathisia:  Negative  Handed:  Right  AIMS (if indicated):     Assets:  Communication Skills Housing Physical Health Social Support  Sleep:       Musculoskeletal: Strength & Muscle Tone: within normal limits Gait & Station: normal Patient leans: N/A   Treatment Plan Summary: Daily contact with patient to assess and evaluate symptoms and progress in treatment and Medication management  Restart Medications: Risperdal 2 mg QHS for mood control Vistaril 25 mg TID  PRN for anxiety  Observation Level/Precautions:  15 minute checks Laboratory:  CBC Chemistry Profile HbAIC UDS Lipids, TSH Psychotherapy:  TBD Medications:  See Above Consultations:  TBD Discharge Concerns:  Safety and medication compliance Estimated LOS: 24 - 48 hours or inpatient if the need exists Other:      Laveda Abbe, NP 4/26/20202:00 PM

## 2018-12-14 NOTE — Progress Notes (Signed)
Admit Note : 23 y/o male admitted to OBS unit with c/o increased stress brought on by living with his grandmother and not working . Pt c/o passive s/i without a plan . Pt denies A/v hall but is obviously responding to internal stimuli looking off to the corner off the room and ceiling,increase mood irritability. " I control my anger and don't let it control me, you feel me.I try to send the messages to my grandmother but she's not receiving them." Pt has strong body odor and says he's allergic to corn and peanut butter. Pt has agreed to take medications while in the hospital even though he stopped medications after he was discharged last year. Search completed and lunch given to pt.

## 2018-12-14 NOTE — BH Assessment (Signed)
Assessment Note  Lawrence [name] is a 23 y.o. male who presented to [facility] as a voluntary walk-in with complaint of suicidal ideation, irritability, and other symptoms.  Pt was last assessed by TTS in October 2018.  He has been treated several times at [facility] for Schizoaffective Disorder.  Pt presented to [facility] with complaint of ''rage outbursts.''  Pt's presentation was not congruent with mood.  He laughed and shrugged as he endorsed the following:  Suicidal ideation (without plan or intent -- no prior suicide attempts); irritability and ''rage''; feelings of worthlessness; despondency.  Pt stated that he lives with his grandmother, that he is unemployed, that he is ''tired of talking to people who can't speak [place]... I'm tired of caring for people... people are mean to me.''  Pt denied homicidal ideation and hallucination.  He reported that he sometimes punches himself.  Pt endorsed daily use of marijuana and weekly use of alcohol.  During assessment, Pt presented as alert and oriented.  He had good eye contact. Demeanor was restless.  Pt's mood was sad.  Affect was not mood-congruent -- Pt was laughing and swearing.  Pt endorsed suicidal ideation without plan or intent and ''rage'' outbursts, as well as other symptoms.  Speech was normal in rate, rhythm, and volume.  Thought processes were rapid, and thought content was circumstantial.  Pt's memory and concentration were intact.  Pt's judgment and impulse control were fair.  Insight was poor.  Pt stated that he is not on any medication.  Consulted with Irving Burton, NP, who determined that Pt should remain in OBS unit.  Diagnosis: Schizoaffective Disorder, Bipolar Type  Past Medical History:  Past Medical History:  Diagnosis Date  . ADD (attention deficit disorder)   . Asthma   . Depressed   . Schizoaffective disorder (HCC)     No past surgical history on file.  Family History: No family history on file.  Social History:  reports that he has been  smoking cigars. He has never used smokeless tobacco. He reports current alcohol use. He reports current drug use. Frequency: 14.00 times per week. Drug: Marijuana.  Additional Social History:  Alcohol / Drug Use Pain Medications: See MAR Prescriptions: See MAR Over the Counter: See MAR History of alcohol / drug use?: Yes Substance #1 Name of Substance 1: Marijuana 1 - Age of First Use: teenager 1 - Amount (size/oz): varied 1 - Frequency: up to 2x a day 1 - Duration: Ongoing 1 - Last Use / Amount: 12/13/2018 Substance #2 Name of Substance 2: Alcohol 2 - Age of First Use: Teenager 2 - Amount (size/oz): Varied 2 - Frequency: Weekly 2 - Duration: Ongoing 2 - Last Use / Amount: Not sure  CIWA:   COWS:    Allergies:  Allergies  Allergen Reactions  . Bee Venom Anaphylaxis  . Apple   . Corn-Containing Products   . Lac Bovis Itching    Milk Related  . Other     Green Beans:   Other reaction(s): ASTHMA, RASH    Home Medications: (Not in a hospital admission)   OB/GYN Status:  No LMP for male patient.  General Assessment Data Location of Assessment: [facility] Assessment Services TTS Assessment: In system Is this a Tele or Face-to-Face Assessment?: Face-to-Face Is this an Initial Assessment or a Re-assessment for this encounter?: Initial Assessment Patient Accompanied by:: N/A Language Other than English: No Living Arrangements: Other (Comment)(Single family home) What gender do you identify as?: Male Marital status: Single Living Arrangements: Other  relatives(grandmother) Can pt return to current living arrangement?: Yes Admission Status: Voluntary Is patient capable of signing voluntary admission?: Yes Referral Source: Self/Family/Friend Insurance type: None     Crisis Care Plan Living Arrangements: Other relatives(grandmother) Name of Psychiatrist: None([facility]) Name of Therapist: None  Education Status Is patient currently in school?: No Is  the patient employed, unemployed or receiving disability?: Unemployed  Risk to self with the past 6 months Suicidal Ideation: Yes-Currently Present Has patient been a risk to self within the past 6 months prior to admission? : No Suicidal Intent: No Has patient had any suicidal intent within the past 6 months prior to admission? : No Is patient at risk for suicide?: Yes Suicidal Plan?: No Has patient had any suicidal plan within the past 6 months prior to admission? : No Access to Means: No What has been your use of drugs/alcohol within the last 12 months?: Marijuana, alcohol Previous Attempts/Gestures: No Intentional Self Injurious Behavior: None Family Suicide History: No Recent stressful life event(s): Conflict (Comment)(Conflict with grandmother) Persecutory voices/beliefs?: No Depression: Yes Depression Symptoms: Despondent, Feeling angry/irritable, Feeling worthless/self pity Substance abuse history and/or treatment for substance abuse?: Yes Suicide prevention information given to non-admitted patients: Not applicable  Risk to Others within the past 6 months Homicidal Ideation: No Does patient have any lifetime risk of violence toward others beyond the six months prior to admission? : No Thoughts of Harm to Others: No Current Homicidal Intent: No Current Homicidal Plan: No Access to Homicidal Means: No History of harm to others?: No Assessment of Violence: None Noted Does patient have access to weapons?: No Criminal Charges Pending?: No Does patient have a court date: No Is patient on probation?: No  Psychosis Hallucinations: None noted(Has history -- see 2018) Delusions: None noted  Mental Status Report Appearance/Hygiene: Body odor, Disheveled Eye Contact: Good Motor Activity: Freedom of movement, Restlessness Speech: Logical/coherent Level of Consciousness: Alert Mood: Sad Affect: Inconsistent with thought content, Preoccupied Anxiety Level: Minimal Thought  Processes: Circumstantial Judgement: Partial Orientation: Person, Place, Time, Situation Obsessive Compulsive Thoughts/Behaviors: None  Cognitive Functioning Concentration: Normal Memory: Recent Intact, Remote Intact Is patient IDD: No Insight: Poor Impulse Control: Fair Appetite: Fair Have you had any weight changes? : No Change Sleep: No Change Vegetative Symptoms: None  ADLScreening [facility] Assessment Services) Patient's cognitive ability adequate to safely complete daily activities?: Yes Patient able to express need for assistance with ADLs?: Yes Independently performs ADLs?: Yes (appropriate for developmental age)  Prior Inpatient Therapy Prior Inpatient Therapy: Yes Prior Therapy Dates: 2017, 2018 [facility] Reason for Treatment: Schizoaffective Disorder  Prior Outpatient Therapy Prior Outpatient Therapy: Yes Prior Therapy Dates: 2018 Prior Therapy Facilty/Provider(s): [facility] Reason for Treatment: Schizoaffective Disorder Does patient have an ACCT team?: No Does patient have Intensive In-House Services?  : No Does patient have [facility] services? : No Does patient have P4CC services?: No  ADL Screening (condition at time of admission) Patient's cognitive ability adequate to safely complete daily activities?: Yes Is the patient deaf or have difficulty hearing?: No Does the patient have difficulty seeing, even when wearing glasses/contacts?: No Does the patient have difficulty concentrating, remembering, or making decisions?: No Patient able to express need for assistance with ADLs?: Yes Does the patient have difficulty dressing or bathing?: No Independently performs ADLs?: Yes (appropriate for developmental age) Does the patient have difficulty walking or climbing stairs?: No Weakness of Legs: None Weakness of Arms/Hands: None  Home Assistive Devices/Equipment Home Assistive Devices/Equipment: None  Therapy Consults (therapy consults  require a physician order) PT Evaluation Needed: No OT Evalulation Needed: No SLP Evaluation Needed: No Abuse/Neglect Assessment (Assessment to be complete while patient is alone) Abuse/Neglect Assessment Can Be Completed: Yes Physical Abuse: Denies Verbal Abuse: Yes, past (Comment) Sexual Abuse: Denies Exploitation of patient/patient's resources: Denies Self-Neglect: Denies Values / Beliefs Cultural Requests During Hospitalization: None Spiritual Requests During Hospitalization: None Consults Spiritual Care Consult Needed: No Social Work Consult Needed: No Merchant navy officer (For Healthcare) Does Patient Have a Medical Advance Directive?: No          Disposition:  Disposition Initial Assessment Completed for this Encounter: Yes Patient referred to: Other (Comment)(Referred to OBS unit per Irving Burton, NP)  On Site Evaluation by:   Reviewed with Physician:    Dorris Fetch Westly Hinnant 12/14/2018 2:10 PM

## 2018-12-14 NOTE — H&P (Signed)
Behavioral Health Medical Screening Exam  Lawrence [name] is an 23 y.o. male.  Total Time spent with patient: 30 minutes  Psychiatric Specialty Exam: Physical Exam  Constitutional: He appears well-developed and well-nourished.  HENT:  Head: Normocephalic.  Respiratory: Effort normal.  Musculoskeletal: Normal range of motion.  Neurological: He is alert.  Psychiatric: His speech is normal and behavior is normal. Judgment normal. His mood appears anxious. Thought content is delusional. Cognition and memory are normal. He exhibits a depressed mood.    Review of Systems  Psychiatric/Behavioral: Positive for depression and hallucinations. The patient is nervous/anxious.   All other systems reviewed and are negative.   There were no vitals taken for this visit.There is no height or weight on file to calculate BMI.  General Appearance: Casual  Eye Contact:  Fair  Speech:  Clear and Coherent  Volume:  Normal  Mood:  Anxious and Depressed  Affect:  Non-Congruent and Constricted  Thought Process:  Disorganized  Orientation:  Full (Time, Place, and Person)  Thought Content:  Illogical and Hallucinations: Auditory  Suicidal Thoughts:  No  Homicidal Thoughts:  No  Memory:  Immediate;   Fair Recent;   Fair  Judgement:  Impaired  Insight:  Shallow  Psychomotor Activity:  Normal  Concentration: Concentration: Fair  Recall:  [employer] of Knowledge:Fair  Language: Good  Akathisia:  Negative  Handed:  Right  AIMS (if indicated):     Assets:  Communication Skills Housing Physical Health Social Support  Sleep:       Musculoskeletal: Strength & Muscle Tone: within normal limits Gait & Station: normal Patient leans: N/A  There were no vitals taken for this visit.  Recommendations:  Based on my evaluation the patient does not appear to have an emergency medical condition.  Admit to OBS unit Restart home medications: Pt agreed to take Risperdal for now. Wants to talk to the  psychiatrist before he takes anything else.  15 minute safety checks  Laveda Abbe, NP 12/14/2018, 1:53 PM

## 2018-12-15 ENCOUNTER — Encounter (HOSPITAL_COMMUNITY): Payer: Self-pay | Admitting: Emergency Medicine

## 2018-12-15 DIAGNOSIS — R45851 Suicidal ideations: Secondary | ICD-10-CM | POA: Diagnosis present

## 2018-12-15 DIAGNOSIS — Z9114 Patient's other noncompliance with medication regimen: Secondary | ICD-10-CM | POA: Diagnosis not present

## 2018-12-15 DIAGNOSIS — G4700 Insomnia, unspecified: Secondary | ICD-10-CM | POA: Diagnosis present

## 2018-12-15 DIAGNOSIS — F1010 Alcohol abuse, uncomplicated: Secondary | ICD-10-CM | POA: Diagnosis present

## 2018-12-15 DIAGNOSIS — F250 Schizoaffective disorder, bipolar type: Principal | ICD-10-CM

## 2018-12-15 DIAGNOSIS — Y92009 Unspecified place in unspecified non-institutional (private) residence as the place of occurrence of the external cause: Secondary | ICD-10-CM | POA: Diagnosis not present

## 2018-12-15 DIAGNOSIS — F6381 Intermittent explosive disorder: Secondary | ICD-10-CM | POA: Diagnosis present

## 2018-12-15 DIAGNOSIS — Z79899 Other long term (current) drug therapy: Secondary | ICD-10-CM | POA: Diagnosis not present

## 2018-12-15 DIAGNOSIS — Z91030 Bee allergy status: Secondary | ICD-10-CM | POA: Diagnosis not present

## 2018-12-15 DIAGNOSIS — J45909 Unspecified asthma, uncomplicated: Secondary | ICD-10-CM | POA: Diagnosis present

## 2018-12-15 DIAGNOSIS — Z91011 Allergy to milk products: Secondary | ICD-10-CM | POA: Diagnosis not present

## 2018-12-15 DIAGNOSIS — F419 Anxiety disorder, unspecified: Secondary | ICD-10-CM | POA: Diagnosis present

## 2018-12-15 DIAGNOSIS — F17210 Nicotine dependence, cigarettes, uncomplicated: Secondary | ICD-10-CM | POA: Diagnosis present

## 2018-12-15 DIAGNOSIS — Z91128 Patient's intentional underdosing of medication regimen for other reason: Secondary | ICD-10-CM | POA: Diagnosis not present

## 2018-12-15 DIAGNOSIS — F1220 Cannabis dependence, uncomplicated: Secondary | ICD-10-CM | POA: Diagnosis present

## 2018-12-15 DIAGNOSIS — Z87892 Personal history of anaphylaxis: Secondary | ICD-10-CM | POA: Diagnosis not present

## 2018-12-15 DIAGNOSIS — T43596A Underdosing of other antipsychotics and neuroleptics, initial encounter: Secondary | ICD-10-CM | POA: Diagnosis present

## 2018-12-15 DIAGNOSIS — Z91018 Allergy to other foods: Secondary | ICD-10-CM | POA: Diagnosis not present

## 2018-12-15 DIAGNOSIS — F909 Attention-deficit hyperactivity disorder, unspecified type: Secondary | ICD-10-CM | POA: Diagnosis present

## 2018-12-15 DIAGNOSIS — Z560 Unemployment, unspecified: Secondary | ICD-10-CM | POA: Diagnosis not present

## 2018-12-15 LAB — HEMOGLOBIN A1C
Hgb A1c MFr Bld: 5.6 % (ref 4.8–5.6)
Mean Plasma Glucose: 114.02 mg/dL

## 2018-12-15 NOTE — Progress Notes (Addendum)
[facility] MD Progress Note  12/15/2018 1:38 PM Donivan Burau  MRN:  [id number]   Subjective:  23 yo male who presented to the to [facility] walk-in for anger issues and irritability.  On assessment he is clearly still responding to internal stimuli but calm and cooperative.  Laughs inappropriately and smiles to someone who is not there.  His hallucinations increase without medications which he stopped in February, decrease with medications and less stress.  Denies suicidal/homicidal ideations, cannabis use.  Inpatient transferred to 500 hall, bed available today.  Principal Problem: Schizoaffective disorder, bipolar type (HCC) Diagnosis: Principal Problem:   Schizoaffective disorder, bipolar type (HCC)  Total Time spent with patient: 30 minutes  Past Psychiatric History: schizoaffective disorder, ADD  Past Medical History:  Past Medical History:  Diagnosis Date  . ADD (attention deficit disorder)   . Asthma   . Depressed   . Schizoaffective disorder (HCC)    History reviewed. No pertinent surgical history. Family History: History reviewed. No pertinent family history. Family Psychiatric  History: none Social History:  Social History   Substance and Sexual Activity  Alcohol Use Not Currently     Social History   Substance and Sexual Activity  Drug Use Yes  . Frequency: 2.0 times per week  . Types: Marijuana   Comment: Last use 12/13/2018    Social History   Socioeconomic History  . Marital status: Single    Spouse name: Not on file  . Number of children: Not on file  . Years of education: Not on file  . Highest education level: Not on file  Occupational History  . Occupation: Unemployed  Social Needs  . Financial resource strain: Not on file  . Food insecurity:    Worry: Not on file    Inability: Not on file  . Transportation needs:    Medical: Not on file    Non-medical: Not on file  Tobacco Use  . Smoking status: Current Some Day Smoker    Years: 0.00    Types:  Cigarettes  . Smokeless tobacco: Never Used  Substance and Sexual Activity  . Alcohol use: Not Currently  . Drug use: Yes    Frequency: 2.0 times per week    Types: Marijuana    Comment: Last use 12/13/2018  . Sexual activity: Never  Lifestyle  . Physical activity:    Days per week: Not on file    Minutes per session: Not on file  . Stress: Not on file  Relationships  . Social connections:    Talks on phone: Not on file    Gets together: Not on file    Attends religious service: Not on file    Active member of club or organization: Not on file    Attends meetings of clubs or organizations: Not on file    Relationship status: Not on file  Other Topics Concern  . Not on file  Social History Narrative   Pt is unemployed; lives with grandmother   Additional Social History:   Sleep: Fair  Appetite:  Fair  Current Medications: Current Facility-Administered Medications  Medication Dose Route Frequency Provider Last Rate Last Dose  . alum & mag hydroxide-simeth (MAALOX/MYLANTA) 200-200-20 MG/5ML suspension 30 mL  30 mL Oral Q4H PRN Laveda Abbe, NP      . hydrOXYzine (ATARAX/VISTARIL) tablet 25 mg  25 mg Oral TID PRN Laveda Abbe, NP   25 mg at 12/14/18 2105  . risperiDONE (RISPERDAL M-TABS) disintegrating tablet 2 mg  2  mg Oral Q8H PRN Laveda Abbe, NP       And  . LORazepam (ATIVAN) tablet 1 mg  1 mg Oral PRN Laveda Abbe, NP       And  . ziprasidone (GEODON) injection 20 mg  20 mg Intramuscular PRN Laveda Abbe, NP      . magnesium hydroxide (MILK OF MAGNESIA) suspension 30 mL  30 mL Oral Daily PRN Laveda Abbe, NP      . risperiDONE (RISPERDAL) tablet 2 mg  2 mg Oral QHS Laveda Abbe, NP   2 mg at 12/14/18 1953  . traZODone (DESYREL) tablet 50 mg  50 mg Oral QHS Laveda Abbe, NP   50 mg at 12/14/18 2105    Lab Results:  Results for orders placed or performed during the hospital encounter of 12/14/18  (from the past 48 hour(s))  Urine rapid drug screen (hosp performed)     Status: Abnormal   Collection Time: 12/14/18  1:53 PM  Result Value Ref Range   Opiates NONE DETECTED NONE DETECTED   Cocaine NONE DETECTED NONE DETECTED   Benzodiazepines NONE DETECTED NONE DETECTED   Amphetamines NONE DETECTED NONE DETECTED   Tetrahydrocannabinol POSITIVE (A) NONE DETECTED   Barbiturates NONE DETECTED NONE DETECTED    Comment: (NOTE) DRUG SCREEN FOR MEDICAL PURPOSES ONLY.  IF CONFIRMATION IS NEEDED FOR ANY PURPOSE, NOTIFY LAB WITHIN 5 DAYS. LOWEST DETECTABLE LIMITS FOR URINE DRUG SCREEN Drug Class                     Cutoff (ng/mL) Amphetamine and metabolites    1000 Barbiturate and metabolites    200 Benzodiazepine                 200 Tricyclics and metabolites     300 Opiates and metabolites        300 Cocaine and metabolites        300 THC                            50 Performed at [facility], [address], [place], [place] [address]   TSH     Status: None   Collection Time: 12/14/18  6:20 PM  Result Value Ref Range   TSH 2.259 0.350 - 4.500 uIU/mL    Comment: Performed by a 3rd Generation assay with a functional sensitivity of <=0.01 uIU/mL. Performed at [facility], [address] [place], [place] [address]   CBC     Status: None   Collection Time: 12/14/18  6:20 PM  Result Value Ref Range   WBC 8.0 4.0 - 10.5 K/uL   RBC 4.68 4.22 - 5.81 MIL/uL   Hemoglobin 14.6 13.0 - 17.0 g/dL   HCT 09.8 11.9 - 14.7 %   MCV 92.9 80.0 - 100.0 fL   MCH 31.2 26.0 - 34.0 pg   MCHC 33.6 30.0 - 36.0 g/dL   RDW 82.9 56.2 - 13.0 %   Platelets 183 150 - 400 K/uL   nRBC 0.0 0.0 - 0.2 %    Comment: Performed at [facility], [address], [place], [place] [address]  Comprehensive metabolic panel     Status: None   Collection Time: 12/14/18  6:20 PM  Result Value Ref Range   Sodium 140 135 - 145 mmol/L   Potassium 4.0 3.5 - 5.1  mmol/L  Chloride 106 98 - 111 mmol/L   CO2 29 22 - 32 mmol/L   Glucose, Bld 80 70 - 99 mg/dL   BUN 8 6 - 20 mg/dL   Creatinine, Ser 1.61 0.61 - 1.24 mg/dL   Calcium 9.0 8.9 - 09.6 mg/dL   Total Protein 6.6 6.5 - 8.1 g/dL   Albumin 4.1 3.5 - 5.0 g/dL   AST 26 15 - 41 U/L   ALT 23 0 - 44 U/L   Alkaline Phosphatase 57 38 - 126 U/L   Total Bilirubin 0.6 0.3 - 1.2 mg/dL   GFR calc non Af Amer >60 >60 mL/min   GFR calc Af Amer >60 >60 mL/min   Anion gap 5 5 - 15    Comment: Performed at [facility], [address], [place], [place] [address]  Lipid panel     Status: None   Collection Time: 12/14/18  6:20 PM  Result Value Ref Range   Cholesterol 106 0 - 200 mg/dL   Triglycerides 33 <981 mg/dL   HDL 49 >19 mg/dL   Total CHOL/HDL Ratio 2.2 RATIO   VLDL 7 0 - 40 mg/dL   LDL Cholesterol 50 0 - 99 mg/dL    Comment:        Total Cholesterol/HDL:CHD Risk Coronary Heart Disease Risk Table                     Men   Women  1/2 Average Risk   3.4   3.3  Average Risk       5.0   4.4  2 X Average Risk   9.6   7.1  3 X Average Risk  23.4   11.0        Use the calculated Patient Ratio above and the CHD Risk Table to determine the patient's CHD Risk.        ATP III CLASSIFICATION (LDL):  <100     mg/dL   Optimal  147-829  mg/dL   Near or Above                    Optimal  130-159  mg/dL   Borderline  562-130  mg/dL   High  >865     mg/dL   Very High Performed at [facility], [address] [place], [place] [address]   Hemoglobin A1c     Status: None   Collection Time: 12/14/18  6:21 PM  Result Value Ref Range   Hgb A1c MFr Bld 5.6 4.8 - 5.6 %    Comment: (NOTE) Pre diabetes:          5.7%-6.4% Diabetes:              >6.4% Glycemic control for   <7.0% adults with diabetes    Mean Plasma Glucose 114.02 mg/dL    Comment: Performed at [facility], [address] [place], [place] [address]    Blood Alcohol level:  Lab Results   Component Value Date   ETH <10 09/20/2017   ETH <10 05/26/2017    Metabolic Disorder Labs: Lab Results  Component Value Date   HGBA1C 5.6 12/14/2018   MPG 114.02 12/14/2018   No results found for: PROLACTIN Lab Results  Component Value Date   CHOL 106 12/14/2018   TRIG 33 12/14/2018   HDL 49 12/14/2018   CHOLHDL 2.2 12/14/2018   VLDL 7 12/14/2018   LDLCALC 50 12/14/2018  Physical Findings: AIMS: Facial and Oral Movements Muscles of Facial Expression: None, normal Lips and Perioral Area: None, normal Jaw: None, normal Tongue: None, normal,Extremity Movements Upper (arms, wrists, hands, fingers): None, normal Lower (legs, knees, ankles, toes): None, normal, Trunk Movements Neck, shoulders, hips: None, normal, Overall Severity Severity of abnormal movements (highest score from questions above): None, normal Incapacitation due to abnormal movements: None, normal Patient's awareness of abnormal movements (rate only patient's report): No Awareness, Dental Status Current problems with teeth and/or dentures?: No Does patient usually wear dentures?: No  CIWA:   N/A COWS:   N/A  Musculoskeletal: Strength & Muscle Tone: within normal limits Gait & Station: normal Patient leans: N/A  Psychiatric Specialty Exam: Physical Exam  Nursing note and vitals reviewed. Constitutional: He is oriented to person, place, and time. He appears well-developed and well-nourished.  HENT:  Head: Normocephalic.  Neck: Normal range of motion.  Respiratory: Effort normal.  Musculoskeletal: Normal range of motion.  Neurological: He is alert and oriented to person, place, and time.  Psychiatric: His speech is normal. Judgment and thought content normal. His affect is blunt. He is actively hallucinating. Cognition and memory are impaired. He exhibits a depressed mood.    Review of Systems  Psychiatric/Behavioral: Positive for hallucinations. The patient is nervous/anxious.   All other systems  reviewed and are negative.   Blood pressure 121/80, pulse 85, temperature 97.9 F (36.6 C), temperature source Oral, resp. rate 16, SpO2 100 %.There is no height or weight on file to calculate BMI.  General Appearance: Casual  Eye Contact:  Fair  Speech:  Normal Rate  Volume:  Normal  Mood:  Euphoric  Affect:  Blunt  Thought Process:  Coherent and Descriptions of Associations: Intact  Orientation:  Other:  person  Thought Content:  Hallucinations: Auditory Visual  Suicidal Thoughts:  No  Homicidal Thoughts:  No  Memory:  Immediate;   Fair Recent;   Fair Remote;   Fair  Judgement:  Impaired  Insight:  Lacking  Psychomotor Activity:  Normal  Concentration:  Concentration: Fair and Attention Span: Fair  Recall:  [employer]  Fund of Knowledge:  Fair  Language:  Fair  Akathisia:  No  Handed:  Right  AIMS (if indicated):   N/A  Assets:  Leisure Time Physical Health Resilience  ADL's:  Intact  Cognition:  Impaired,  Mild  Sleep:   N/A     Treatment Plan Summary: Daily contact with patient to assess and evaluate symptoms and progress in treatment, Medication management and Plan schizoaffective disorder, bipolar type:  -Continued Risperdal 2 mg at bedtime -[facility]  Anxiety: -Continued hydroxyzine 25 mg TID PRN  Insomnia: -Continued Trazodone 50 mg at bedtime PRN  Nanine MeansJamison Lord, NP 12/15/2018, 1:38 PM   Patient seen face-to-face for psychiatric evaluation, chart reviewed and case discussed with the physician extender and developed treatment plan. Reviewed the information documented and agree with the treatment plan.  Juanetta BeetsJacqueline Mylisa Brunson, DO 12/15/18 2:58 PM

## 2018-12-15 NOTE — Progress Notes (Signed)
Lawrence [name] is cooperative. He is disorganized and tangential. He is talking about having to make " sacrifices" for the people "I Love." Talks about not being dead. Denies S.I. reports he is here for anger problems. Mood anxious and depressed. Compliant with medication.

## 2018-12-15 NOTE — Progress Notes (Signed)
On admission to [facility] pt is cooperative, but appears a little fidgety. He reports some dissociative feelings and some anxiety. Denies SI/HI and denies AVH, but appears to be a little distracted. He is very pleasant.

## 2018-12-15 NOTE — Progress Notes (Signed)
Pt accepted to [facility], Bed 501-2 NP is the accepting provider.  Dr. is the attending provider.  Call report to [phone]  Olivette @ [facility] notified.   Pt is Voluntary.  Pt scheduled  to transfer to [facility] as soon as possible.  Timmothy Euler. Kaylyn Lim, MSW, LCSWA Disposition Clinical Social Work [phone] (cell) [phone] (office)

## 2018-12-15 NOTE — Tx Team (Signed)
Initial Treatment Plan 12/15/2018 2:50 PM Lawrence [name] [id number]:[id number]    PATIENT STRESSORS: Marital or family conflict Medication change or noncompliance Occupational concerns   PATIENT STRENGTHS: Average or above average intelligence Communication skills Motivation for treatment/growth Supportive family/friends   PATIENT IDENTIFIED PROBLEMS: Disorganized thinking  Anger   Depression                 DISCHARGE CRITERIA:  Ability to meet basic life and health needs Adequate post-discharge living arrangements Improved stabilization in mood, thinking, and/or behavior Medical problems require only outpatient monitoring Motivation to continue treatment in a less acute level of care Need for constant or close observation no longer present Reduction of life-threatening or endangering symptoms to within safe limits Safe-care adequate arrangements made Verbal commitment to aftercare and medication compliance  PRELIMINARY DISCHARGE PLAN: Attend aftercare/continuing care group Return to previous living arrangement Return to previous work or school arrangements  PATIENT/FAMILY INVOLVEMENT: This treatment plan has been presented to and reviewed with the patient, Lawrence [name], and/or family member, na.  The patient and family have been given the opportunity to ask questions and make suggestions.  Genia Del, RN 12/15/2018, 2:50 PM

## 2018-12-16 MED ORDER — TRAZODONE HCL 100 MG PO TABS
200.0000 mg | ORAL_TABLET | Freq: Every day | ORAL | Status: DC
  Administered 2018-12-16: 200 mg via ORAL
  Filled 2018-12-16: qty 2
  Filled 2018-12-16: qty 28
  Filled 2018-12-16 (×2): qty 2

## 2018-12-16 MED ORDER — BENZTROPINE MESYLATE 0.5 MG PO TABS
0.5000 mg | ORAL_TABLET | Freq: Two times a day (BID) | ORAL | Status: DC
  Administered 2018-12-16 – 2018-12-18 (×4): 0.5 mg via ORAL
  Filled 2018-12-16 (×7): qty 1

## 2018-12-16 MED ORDER — CARBAMAZEPINE 100 MG PO CHEW
100.0000 mg | CHEWABLE_TABLET | Freq: Three times a day (TID) | ORAL | Status: DC
  Administered 2018-12-16 – 2018-12-18 (×4): 100 mg via ORAL
  Filled 2018-12-16: qty 42
  Filled 2018-12-16 (×9): qty 1
  Filled 2018-12-16 (×2): qty 42

## 2018-12-16 MED ORDER — RISPERIDONE 3 MG PO TABS
3.0000 mg | ORAL_TABLET | Freq: Two times a day (BID) | ORAL | Status: DC
  Administered 2018-12-17 – 2018-12-18 (×2): 3 mg via ORAL
  Filled 2018-12-16 (×5): qty 1

## 2018-12-16 NOTE — Plan of Care (Signed)
  Problem: Coping: Goal: Coping ability will improve Outcome: Progressing   Problem: Activity: Goal: Interest or engagement in leisure activities will improve Outcome: Progressing

## 2018-12-16 NOTE — Progress Notes (Signed)
D: Pt denies SI/HI/AVH. Pt is pleasant and cooperative. Pt visible in the dayroom this evening. Pt stated he was doing good today. A: Pt was offered support and encouragement. Pt was given scheduled medications. Pt was encourage to attend groups. Q 15 minute checks were done for safety.  R:Pt attends groups and interacts well with peers and staff. Pt is taking medication. Pt has no complaints.Pt receptive to treatment and safety maintained on unit.  Problem: Activity: Goal: Interest or engagement in leisure activities will improve Outcome: Progressing

## 2018-12-16 NOTE — [facility] Suicide Risk Assessment (Signed)
[facility] Admission Suicide Risk Assessment   Nursing information obtained from:  Patient Demographic factors:  Male Current Mental Status:  NA Loss Factors:  NA Historical Factors:  NA Risk Reduction Factors:     Total Time spent with patient: 45 minutes Principal Problem: Schizoaffective disorder, bipolar type (HCC) Diagnosis:  Principal Problem:   Schizoaffective disorder, bipolar type (HCC)  Subjective Data: Patient elaborates that he is here for "anger outburst" and that he does well with Risperdal  Continued Clinical Symptoms:    The "Alcohol Use Disorders Identification Test", Guidelines for Use in Primary Care, Second Edition.  World Science writer [facility]). Score between 0-7:  no or low risk or alcohol related problems. Score between 8-15:  moderate risk of alcohol related problems. Score between 16-19:  high risk of alcohol related problems. Score 20 or above:  warrants further diagnostic evaluation for alcohol dependence and treatment.   CLINICAL FACTORS:   Schizophrenia:   Less than 56 years old   COGNITIVE FEATURES THAT CONTRIBUTE TO RISK:  Loss of executive function    SUICIDE RISK:   Minimal: No identifiable suicidal ideation.  Patients presenting with no risk factors but with morbid ruminations; may be classified as minimal risk based on the severity of the depressive symptoms  PLAN OF CARE: Encourage abstinence from cannabis begin Risperdal therapy  I certify that inpatient services furnished can reasonably be expected to improve the patient's condition.   Malvin Johns, MD 12/16/2018, 10:13 AM
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
## 2018-12-16 NOTE — Progress Notes (Signed)
Recreation Therapy Notes  INPATIENT RECREATION THERAPY ASSESSMENT  Patient Details Name: Lawrence [name] MRN: [id number] DOB: [birth year] Today's Date: 12/16/2018       Information Obtained From: Patient  Able to Participate in Assessment/Interview: Yes  Patient Presentation: Alert  Reason for Admission (Per Patient): Other (Comments)(Anger outburst)  Patient Stressors: Family, Other (Comment)(Parent issues, smoking cigarettes)  Coping Skills:   Isolation, Journal, Sports, TV, Arguments, Aggression, Music, Exercise, Meditate, Deep Breathing, Substance Abuse, Impulsivity, Talk, Prayer, Art, Avoidance, Read, Hot Bath/Shower  Leisure Interests (2+):  Games - Video games, Individual - Reading, Individual - Other (Comment)(Learn things)  Frequency of Recreation/Participation: Other (Comment)(Daily)  Awareness of Community Resources:  Yes  Community Resources:  [employer], Research scientist (physical sciences), Programmer, systems, Other (Comment)(Skating rink)  Current Use: Yes  If no, Barriers?:    Expressed Interest in [employer] Information: No  [employer] of Residence:  [place]  Patient Main Form of Transportation: Set designer  Patient Strengths:  Optometrist; Nice  Patient Identified Areas of Improvement:  Focus and Dedication to goals in life  Patient Goal for Hospitalization:  "to improve mood"  Current SI (including self-harm):  No  Current HI:  No  Current AVH: No  Staff Intervention Plan: Group Attendance, Collaborate with Interdisciplinary Treatment Team  Consent to Intern Participation: N/A    Caroll Rancher, LRT/CTRS  Caroll Rancher A 12/16/2018, 12:20 PM

## 2018-12-16 NOTE — H&P (Signed)
Psychiatric Admission Assessment Adult  Patient Identification: Romie MinusCameron Delawder MRN:  [id number] Date of Evaluation:  12/16/2018 Chief Complaint:  Schizoeffective disorder Principal Diagnosis: Schizoaffective disorder, bipolar type (HCC) Diagnosis:  Principal Problem:   Schizoaffective disorder, bipolar type (HCC)  History of Present Illness:   This is the the latest of multiple psychiatric admission here, Mr. Tiburcio PeaHarris is 23 years of age, he has been diagnosed with schizophrenia versus a schizoaffective type disorder, chronic cannabis abuse using approximately every other day so would probably qualifies dependency, as well as a history of intermittent explosive disorder, and a history of alcohol and alprazolam abuse.   He reports a history of auditory and visual hallucinations dating back to he carries a diagnosis of 2018, the present time minimizes it stating "I used to see things but not anymore" however during his assessment prior to this admission he was clearly responding to stimuli according to staff, laughing to himself so forth.  Further had a history of overdosing on cough syrup when he was 17, also dependent on cannabis than but that was his only suicide attempt and he states it was a "accident" and that he was not suicidal at the time.  Further he was abusing a compound called phenibut-  So it is in this context, chronic substance abuse and cannabis dependency, history of schizoaffective disorder bipolar type, that he presented noncompliant with Risperdal, reporting depression about not working and turns about anger outburst, however it was clear upon further examination that he was in fact in a psychotic state.  Apparently [facility] but is not been compliant with him since February 2019, apparently he had been on Risperdal Consta and oxcarbazepine as well as the oral Risperdal.  Currently he is quite pleasant he is alert and oriented to person place time situation day  but not exact date he tells me he is here to get his anger under control he denies current auditory visual loose Nations denies thoughts of harming self or others no EPS or TD. Associated Signs/Symptoms: Depression Symptoms:  difficulty concentrating, (Hypo) Manic Symptoms:  Hallucinations, Anxiety Symptoms:  n/a Psychotic Symptoms:  Hallucinations: Auditory PTSD Symptoms: NA Total Time spent with patient: 45 minutes  Past Psychiatric History: As discussed above several admissions here as recently as 2/19 Chronic polysubstance abuse but mainly that is intermittent the cannabis seems to be the drug of dependency long-term Chronic poor compliance and has been on Risperdal Consta in the past  Is the patient at risk to self? Yes.    Has the patient been a risk to self in the past 6 months? No.  Has the patient been a risk to self within the distant past? Yes.    Is the patient a risk to others? No.  Has the patient been a risk to others in the past 6 months? No.  Has the patient been a risk to others within the distant past? No.   Alcohol Screening:   Substance Abuse History in the last 12 months:  Yes.   Consequences of Substance Abuse: Medical Consequences:  Probable exacerbation or induction of psychosis Previous Psychotropic Medications: Yes  Psychological Evaluations: No  Past Medical History:  Past Medical History:  Diagnosis Date  . ADD (attention deficit disorder)   . Asthma   . Depressed   . Schizoaffective disorder (HCC)    History reviewed. No pertinent surgical history. Family History: History reviewed. No pertinent family history. Family Psychiatric  History: ukn Tobacco Screening:   Social  History:  Social History   Substance and Sexual Activity  Alcohol Use Not Currently     Social History   Substance and Sexual Activity  Drug Use Yes  . Frequency: 2.0 times per week  . Types: Marijuana   Comment: Last use 12/13/2018    Additional Social History:                            Allergies:   Allergies  Allergen Reactions  . Bee Venom Anaphylaxis  . Apple   . Corn-Containing Products   . Lac Bovis Itching    Milk Related  . Other     Green Beans:   Other reaction(s): ASTHMA, RASH  . Peanut Butter Flavor Cough   Lab Results:  Results for orders placed or performed during the hospital encounter of 12/14/18 (from the past 48 hour(s))  Urine rapid drug screen (hosp performed)     Status: Abnormal   Collection Time: 12/14/18  1:53 PM  Result Value Ref Range   Opiates NONE DETECTED NONE DETECTED   Cocaine NONE DETECTED NONE DETECTED   Benzodiazepines NONE DETECTED NONE DETECTED   Amphetamines NONE DETECTED NONE DETECTED   Tetrahydrocannabinol POSITIVE (A) NONE DETECTED   Barbiturates NONE DETECTED NONE DETECTED    Comment: (NOTE) DRUG SCREEN FOR MEDICAL PURPOSES ONLY.  IF CONFIRMATION IS NEEDED FOR ANY PURPOSE, NOTIFY LAB WITHIN 5 DAYS. LOWEST DETECTABLE LIMITS FOR URINE DRUG SCREEN Drug Class                     Cutoff (ng/mL) Amphetamine and metabolites    1000 Barbiturate and metabolites    200 Benzodiazepine                 200 Tricyclics and metabolites     300 Opiates and metabolites        300 Cocaine and metabolites        300 THC                            50 Performed at [facility], [address], [place], [place] [address]   TSH     Status: None   Collection Time: 12/14/18  6:20 PM  Result Value Ref Range   TSH 2.259 0.350 - 4.500 uIU/mL    Comment: Performed by a 3rd Generation assay with a functional sensitivity of <=0.01 uIU/mL. Performed at [facility], [address], [place], [place] [address]   CBC     Status: None   Collection Time: 12/14/18  6:20 PM  Result Value Ref Range   WBC 8.0 4.0 - 10.5 K/uL   RBC 4.68 4.22 - 5.81 MIL/uL   Hemoglobin 14.6 13.0 - 17.0 g/dL   HCT 58.8 32.5 - 49.8 %   MCV 92.9 80.0 - 100.0 fL   MCH 31.2 26.0 - 34.0 pg   MCHC  33.6 30.0 - 36.0 g/dL   RDW 26.4 15.8 - 30.9 %   Platelets 183 150 - 400 K/uL   nRBC 0.0 0.0 - 0.2 %    Comment: Performed at [facility], [address] [place], [place] [address]  Comprehensive metabolic panel     Status: None   Collection Time: 12/14/18  6:20 PM  Result Value Ref Range   Sodium 140 135 - 145 mmol/L   Potassium 4.0 3.5 - 5.1  mmol/L   Chloride 106 98 - 111 mmol/L   CO2 29 22 - 32 mmol/L   Glucose, Bld 80 70 - 99 mg/dL   BUN 8 6 - 20 mg/dL   Creatinine, Ser 4.09 0.61 - 1.24 mg/dL   Calcium 9.0 8.9 - 81.1 mg/dL   Total Protein 6.6 6.5 - 8.1 g/dL   Albumin 4.1 3.5 - 5.0 g/dL   AST 26 15 - 41 U/L   ALT 23 0 - 44 U/L   Alkaline Phosphatase 57 38 - 126 U/L   Total Bilirubin 0.6 0.3 - 1.2 mg/dL   GFR calc non Af Amer >60 >60 mL/min   GFR calc Af Amer >60 >60 mL/min   Anion gap 5 5 - 15    Comment: Performed at [facility], [address], [place], [place] [address]  Lipid panel     Status: None   Collection Time: 12/14/18  6:20 PM  Result Value Ref Range   Cholesterol 106 0 - 200 mg/dL   Triglycerides 33 <295 mg/dL   HDL 49 >62 mg/dL   Total CHOL/HDL Ratio 2.2 RATIO   VLDL 7 0 - 40 mg/dL   LDL Cholesterol 50 0 - 99 mg/dL    Comment:        Total Cholesterol/HDL:CHD Risk Coronary Heart Disease Risk Table                     Men   Women  1/2 Average Risk   3.4   3.3  Average Risk       5.0   4.4  2 X Average Risk   9.6   7.1  3 X Average Risk  23.4   11.0        Use the calculated Patient Ratio above and the CHD Risk Table to determine the patient's CHD Risk.        ATP III CLASSIFICATION (LDL):  <100     mg/dL   Optimal  130-865  mg/dL   Near or Above                    Optimal  130-159  mg/dL   Borderline  784-696  mg/dL   High  >295     mg/dL   Very High Performed at [facility], [address], [place], [place] [address]   Hemoglobin A1c     Status: None   Collection Time: 12/14/18   6:21 PM  Result Value Ref Range   Hgb A1c MFr Bld 5.6 4.8 - 5.6 %    Comment: (NOTE) Pre diabetes:          5.7%-6.4% Diabetes:              >6.4% Glycemic control for   <7.0% adults with diabetes    Mean Plasma Glucose 114.02 mg/dL    Comment: Performed at [facility], [address] [place], [place] [address]    Blood Alcohol level:  Lab Results  Component Value Date   ETH <10 09/20/2017   ETH <10 05/26/2017    Metabolic Disorder Labs:  Lab Results  Component Value Date   HGBA1C 5.6 12/14/2018   MPG 114.02 12/14/2018   No results found for: PROLACTIN Lab Results  Component Value Date   CHOL 106 12/14/2018   TRIG 33 12/14/2018   HDL 49 12/14/2018   CHOLHDL 2.2 12/14/2018   VLDL 7 12/14/2018   LDLCALC 50  12/14/2018    Current Medications: Current Facility-Administered Medications  Medication Dose Route Frequency Provider Last Rate Last Dose  . alum & mag hydroxide-simeth (MAALOX/MYLANTA) 200-200-20 MG/5ML suspension 30 mL  30 mL Oral Q4H PRN Laveda Abbe, NP      . benztropine (COGENTIN) tablet 0.5 mg  0.5 mg Oral BID Malvin Johns, MD      . carbamazepine (TEGRETOL) chewable tablet 100 mg  100 mg Oral TID Malvin Johns, MD      . hydrOXYzine (ATARAX/VISTARIL) tablet 25 mg  25 mg Oral TID PRN Laveda Abbe, NP   25 mg at 12/15/18 2040  . risperiDONE (RISPERDAL M-TABS) disintegrating tablet 2 mg  2 mg Oral Q8H PRN Laveda Abbe, NP       And  . LORazepam (ATIVAN) tablet 1 mg  1 mg Oral PRN Laveda Abbe, NP       And  . ziprasidone (GEODON) injection 20 mg  20 mg Intramuscular PRN Laveda Abbe, NP      . magnesium hydroxide (MILK OF MAGNESIA) suspension 30 mL  30 mL Oral Daily PRN Laveda Abbe, NP      . Melene Muller ON 12/17/2018] risperiDONE (RISPERDAL) tablet 3 mg  3 mg Oral BID Malvin Johns, MD      . traZODone (DESYREL) tablet 200 mg  200 mg Oral QHS Malvin Johns, MD       PTA Medications: No medications prior  to admission.    Musculoskeletal: Strength & Muscle Tone: within normal limits Gait & Station: normal Patient leans: N/A  Psychiatric Specialty Exam: Physical Exam no EPS or TD on exam vitals are stable no involuntary movements  ROS neurological review of system negative for seizures or withdrawal psychosis/endocrine review of systems negative for thyroid issues or hyperprolactinemia/cardiovascular review of symptoms negative for palpitation signs or symptoms of antipsychotic-induced cardiac toxicity  Blood pressure 116/69, pulse 97, temperature 97.6 F (36.4 C), temperature source Oral, resp. rate 18, height 6' (1.829 m), weight 78.5 kg, SpO2 100 %.Body mass index is 23.46 kg/m.  General Appearance: Casual  Eye Contact:  Good  Speech:  Clear and Coherent  Volume:  Decreased  Mood:  Euthymic  Affect:  Appropriate  Thought Process:  Goal Directed, Irrelevant and Descriptions of Associations: Loose  Orientation:  Full (Time, Place, and Person)  Thought Content:  Hallucinations: Auditory generally coherent and goal-directed  Suicidal Thoughts:  No  Homicidal Thoughts:  No  Memory:  Immediate;   Good  Judgement: Seemingly intact despite psychosis  Insight:  Fair  Psychomotor Activity:  Normal  Concentration:  Concentration: Good  Recall:  Fair  Fund of Knowledge:  Good  Language:  Good  Akathisia:  Negative  Handed:  Right  AIMS (if indicated):     Assets:  Communication Skills Desire for Improvement  ADL's:  Intact  Cognition:  WNL  Sleep:  Number of Hours: 5    Treatment Plan Summary: Daily contact with patient to assess and evaluate symptoms and progress in treatment and Medication management  Observation Level/Precautions:  15 minute checks  Laboratory:  UDS  Psychotherapy: reality based  Medications:  risperdal added  Consultations:  Not needed  Discharge Concerns:  Long term compliance  Estimated LOS: 5-10d  Other: Schizoaffective bipolar type/cannabis  abuse/history of polysubstance abuse   Physician Treatment Plan for Primary Diagnosis: Schizoaffective disorder, bipolar type (HCC) Long Term Goal(s): Improvement in symptoms so as ready for discharge  Short Term Goals: Ability to verbalize feelings  will improve  Physician Treatment Plan for Secondary Diagnosis: Principal Problem:   Schizoaffective disorder, bipolar type (HCC)  Long Term Goal(s): Improvement in symptoms so as ready for discharge  Short Term Goals: Ability to identify and develop effective coping behaviors will improve  I certify that inpatient services furnished can reasonably be expected to improve the patient's condition.    Malvin Johns, MD 4/28/202010:15 AM

## 2018-12-16 NOTE — Progress Notes (Signed)
Patient ID: Lawrence [name], male   DOB: 10/30/1995, 22 y.o.   MRN: [id number] [name] NOVEL CORONAVIRUS (COVID-19) DAILY CHECK-OFF SYMPTOMS - answer yes or no to each - every day NO YES  Have you had a fever in the past 24 hours?  . Fever (Temp > 37.80C / 100F) X   Have you had any of these symptoms in the past 24 hours? . New Cough .  Sore Throat  .  Shortness of Breath .  Difficulty Breathing .  Unexplained Body Aches   X   Have you had any one of these symptoms in the past 24 hours not related to allergies?   . Runny Nose .  Nasal Congestion .  Sneezing   X   If you have had runny nose, nasal congestion, sneezing in the past 24 hours, has it worsened?  X   EXPOSURES - check yes or no X   Have you traveled outside the state in the past 14 days?  X   Have you been in contact with someone with a confirmed diagnosis of COVID-19 or PUI in the past 14 days without wearing appropriate PPE?  X   Have you been living in the same home as a person with confirmed diagnosis of COVID-19 or a PUI (household contact)?    X   Have you been diagnosed with COVID-19?    X              What to do next: Answered NO to all: Answered YES to anything:   Proceed with unit schedule Follow the [facility] Inpatient Flowsheet.   

## 2018-12-16 NOTE — Progress Notes (Signed)
Adult Psychoeducational Group Note  Date:  12/16/2018 Time:  9:44 PM  Group Topic/Focus:  Wrap-Up Group:   The focus of this group is to help patients review their daily goal of treatment and discuss progress on daily workbooks.  Participation Level:  Active  Participation Quality:  Appropriate  Affect:  Appropriate  Cognitive:  Appropriate  Insight: Appropriate  Engagement in Group:  Engaged  Modes of Intervention:  Discussion  Additional Comments:  Patient attended wrap-up group and said that his day was a 9.  His goal for today were to attend groups and to be in a better mood. Patient said he achieved his goals.   Cortlyn Cannell W Naithen Rivenburg 12/16/2018, 9:44 PM

## 2018-12-16 NOTE — Progress Notes (Signed)
Recreation Therapy Notes  Date: 4.28.20 Time: 1000 Location: 500 [address] Dayroom  Group Topic: Triggers  Goal Area(s) Addresses:  Patient will identify biggest triggers. Patient will identify how triggers can be avoided. Patient will identify how to face triggers head on.  Behavioral Response: Engaged  Intervention: Worksheet  Activity:  Triggers.  Patient was to identify their 3 biggest triggers, how they avoid/reduse exposure to triggers and how they deal with them head on.  Education: Triggers, Discharge Planning  Education Outcome: Acknowledges understanding/In group clarification offered/Needs additional education.   Clinical Observations/Feedback:  Pt identified triggers as "something internal that doesn't have to be triggered by anyone person but when it is, it can bring up old memories".  Pt identified his triggers as art/gestures/jeolousy influenced by mind games; coldness-when people show no emotion, stubbornness- not trying to hear others thoughts; avoid/reduce exposure with self love, silence/warmth and be flexible; face them head on by dedication/rules, preaching and develop hardness people can't mold.    Caroll Rancher, LRT/CTRS      Caroll Rancher A 12/16/2018 11:38 AM

## 2018-12-16 NOTE — Progress Notes (Signed)
D: Pt denies SI/HI/AVH. Pt stated he was better A: Pt was offered support and encouragement. Pt was given scheduled medications. Pt was encourage to attend groups. Q 15 minute checks were done for safety.  R: safety maintained on unit.

## 2018-12-17 NOTE — Progress Notes (Signed)
Lawrence [name] attended wrap-up group. Pt denies SI/HI/AVH/Pain at this time. Pt appears anxious/depressed in affect and mood. Pt states he was feeling drowsy all day, improving now. Pt refused trazodone HS. Support offered. Pleasant in the milieu. Will continue with POC.

## 2018-12-17 NOTE — [facility] Group Notes (Signed)
Occupational Therapy Group Note  Date:  12/17/2018 Time:  9:13 PM  Group Topic/Focus:  Leisure Group  Participation Level:  Active  Participation Quality:  Appropriate  Affect:  Flat  Cognitive:  Alert  Insight: Lacking  Engagement in Group:  Engaged  Modes of Intervention:  Activity, Discussion, Education and Socialization  Additional Comments:    S: "Music is really important to me"  O: OT Group with focus on leisure and coping skills. Juanna Cao game facilitated with special stipulations to name various coping skills throughout the game. Pt attention and sequencing assessed throughout session.  A: Pt presents with flat affect, engaged and participatory throughout session. Pt attending throughout activity with min VC's to stay on task. Noted some improvement in affect throughout activity. Pt very engaged and wanting to win. He shares that music is an important coping skill for him. He also shares that his goals are to "unify humanity however he knows how".  P: OT group will be x1 per week while pt in [facility]  Dalphine Handing, MSOT, OTR/L Behavioral Health OT/ Acute Relief OT PHP Office: [phone]  Dalphine Handing 12/17/2018, 9:13 PM

## 2018-12-17 NOTE — Progress Notes (Signed)
Patient ID: Lawrence [name], male   DOB: 1995/08/25, 24 y.o.   MRN: [id number] D) Pt has been flat, sad, poor eye contact. cooperative on approach but also guarded. Pt has been positive for groups and activities with minimal prompting. Pt is out in the milieu with minimal interaction with peers. Participation adequate in groups. Pt c/o feeling "drowsy from that medicine" and refused noon and evening medications as a result. Pt denies avh and s.i. Pt thoughts disorganized with some thought blocking and responding to internal stimuli at times. A) level 3 obs for safety, support and encouragement provided. Med ed reinforced. R) Guarded.

## 2018-12-17 NOTE — [facility] Suicide Risk Assessment (Signed)
[facility] INPATIENT:  Family/Significant Other Suicide Prevention Education  Suicide Prevention Education:  Education Completed; Lonzo Cloud, grandmother, [phone] has been identified by the patient as the family member/significant other with whom the patient will be residing, and identified as the person(s) who will aid the patient in the event of a mental health crisis (suicidal ideations/suicide attempt).  With written consent from the patient, the family member/significant other has been provided the following suicide prevention education, prior to the and/or following the discharge of the patient.  The suicide prevention education provided includes the following:  Suicide risk factors  Suicide prevention and interventions  National Suicide Hotline telephone number  [facility] assessment telephone number  [facility] Emergency Assistance 911  [facility] and/or Residential Mobile Crisis Unit telephone number  Request made of family/significant other to:  Remove weapons (e.g., guns, rifles, knives), all items previously/currently identified as safety concern.    Remove drugs/medications (over-the-counter, prescriptions, illicit drugs), all items previously/currently identified as a safety concern.  The family member/significant other verbalizes understanding of the suicide prevention education information provided.  The family member/significant other agrees to remove the items of safety concern listed above.  Grandmother shares patient has a history of medication non-compliance. She reports that everything was going well for the past year until he stopped taking his medications and attending his appointments.   Grandmother states patient is allowed to return home, but he will not be allowed to continue living with her if he stops going to appointments and taking his medications.   Darreld Mclean 12/17/2018, 11:08 AM

## 2018-12-17 NOTE — Progress Notes (Signed)
[place] NOVEL CORONAVIRUS (COVID-19) DAILY CHECK-OFF SYMPTOMS - answer yes or no to each - every day NO YES  Have you had a fever in the past 24 hours?  . Fever (Temp > 37.80C / 100F) X   Have you had any of these symptoms in the past 24 hours? . New Cough .  Sore Throat  .  Shortness of Breath .  Difficulty Breathing .  Unexplained Body Aches   X   Have you had any one of these symptoms in the past 24 hours not related to allergies?   . Runny Nose .  Nasal Congestion .  Sneezing   X   If you have had runny nose, nasal congestion, sneezing in the past 24 hours, has it worsened?  X   EXPOSURES - check yes or no X   Have you traveled outside the state in the past 14 days?  X   Have you been in contact with someone with a confirmed diagnosis of COVID-19 or PUI in the past 14 days without wearing appropriate PPE?  X   Have you been living in the same home as a person with confirmed diagnosis of COVID-19 or a PUI (household contact)?    X   Have you been diagnosed with COVID-19?    X              What to do next: Answered NO to all: Answered YES to anything:   Proceed with unit schedule Follow the [facility] Inpatient Flowsheet.   

## 2018-12-17 NOTE — Progress Notes (Signed)
Patient ID: Lawrence [name], male   DOB: Feb 09, 1996, 23 y.o.   MRN: [id number] [place] NOVEL CORONAVIRUS (COVID-19) DAILY CHECK-OFF SYMPTOMS - answer yes or no to each - every day NO YES  Have you had a fever in the past 24 hours?  . Fever (Temp > 37.80C / 100F) X   Have you had any of these symptoms in the past 24 hours? . New Cough .  Sore Throat  .  Shortness of Breath .  Difficulty Breathing .  Unexplained Body Aches   X   Have you had any one of these symptoms in the past 24 hours not related to allergies?   . Runny Nose .  Nasal Congestion .  Sneezing   X   If you have had runny nose, nasal congestion, sneezing in the past 24 hours, has it worsened?  X   EXPOSURES - check yes or no X   Have you traveled outside the state in the past 14 days?  X   Have you been in contact with someone with a confirmed diagnosis of COVID-19 or PUI in the past 14 days without wearing appropriate PPE?  X   Have you been living in the same home as a person with confirmed diagnosis of COVID-19 or a PUI (household contact)?    X   Have you been diagnosed with COVID-19?    X              What to do next: Answered NO to all: Answered YES to anything:   Proceed with unit schedule Follow the [facility] Inpatient Flowsheet.

## 2018-12-17 NOTE — Progress Notes (Signed)
The focus of this group is to help patients establish daily goals to achieve during treatment and discuss how the patient can incorporate goal setting into their daily lives to aide in recovery.  Pt participated in goals group Pt says that he wants to maintain his current feeling, he feels good and thinks he's improving.

## 2018-12-17 NOTE — Tx Team (Signed)
Interdisciplinary Treatment and Diagnostic Plan Update  12/17/2018 Time of Session: 9:00am Lawrence [name] MRN: [id number]  Principal Diagnosis: Schizoaffective disorder, bipolar type [facility])  Secondary Diagnoses: Principal Problem:   Schizoaffective disorder, bipolar type (HCC)   Current Medications:  Current Facility-Administered Medications  Medication Dose Route Frequency Provider Last Rate Last Dose  . alum & mag hydroxide-simeth (MAALOX/MYLANTA) 200-200-20 MG/5ML suspension 30 mL  30 mL Oral Q4H PRN Laveda Abbe, NP      . benztropine (COGENTIN) tablet 0.5 mg  0.5 mg Oral BID Malvin Johns, MD   0.5 mg at 12/17/18 0806  . carbamazepine (TEGRETOL) chewable tablet 100 mg  100 mg Oral TID Malvin Johns, MD   100 mg at 12/17/18 6734  . hydrOXYzine (ATARAX/VISTARIL) tablet 25 mg  25 mg Oral TID PRN Laveda Abbe, NP   25 mg at 12/16/18 2058  . risperiDONE (RISPERDAL M-TABS) disintegrating tablet 2 mg  2 mg Oral Q8H PRN Laveda Abbe, NP       And  . LORazepam (ATIVAN) tablet 1 mg  1 mg Oral PRN Laveda Abbe, NP       And  . ziprasidone (GEODON) injection 20 mg  20 mg Intramuscular PRN Laveda Abbe, NP      . magnesium hydroxide (MILK OF MAGNESIA) suspension 30 mL  30 mL Oral Daily PRN Laveda Abbe, NP      . risperiDONE (RISPERDAL) tablet 3 mg  3 mg Oral BID Malvin Johns, MD   3 mg at 12/17/18 0805  . traZODone (DESYREL) tablet 200 mg  200 mg Oral QHS Malvin Johns, MD   200 mg at 12/16/18 2058   PTA Medications: No medications prior to admission.    Patient Stressors: Marital or family conflict Medication change or noncompliance Occupational concerns  Patient Strengths: Average or above average intelligence Communication skills Motivation for treatment/growth Supportive family/friends  Treatment Modalities: Medication Management, Group therapy, Case management,  1 to 1 session with clinician, Psychoeducation, Recreational  therapy.   Physician Treatment Plan for Primary Diagnosis: Schizoaffective disorder, bipolar type (HCC) Long Term Goal(s): Improvement in symptoms so as ready for discharge Improvement in symptoms so as ready for discharge   Short Term Goals: Ability to verbalize feelings will improve Ability to identify and develop effective coping behaviors will improve  Medication Management: Evaluate patient's response, side effects, and tolerance of medication regimen.  Therapeutic Interventions: 1 to 1 sessions, Unit Group sessions and Medication administration.  Evaluation of Outcomes: Progressing  Physician Treatment Plan for Secondary Diagnosis: Principal Problem:   Schizoaffective disorder, bipolar type (HCC)  Long Term Goal(s): Improvement in symptoms so as ready for discharge Improvement in symptoms so as ready for discharge   Short Term Goals: Ability to verbalize feelings will improve Ability to identify and develop effective coping behaviors will improve     Medication Management: Evaluate patient's response, side effects, and tolerance of medication regimen.  Therapeutic Interventions: 1 to 1 sessions, Unit Group sessions and Medication administration.  Evaluation of Outcomes: Progressing   RN Treatment Plan for Primary Diagnosis: Schizoaffective disorder, bipolar type (HCC) Long Term Goal(s): Knowledge of disease and therapeutic regimen to maintain health will improve  Short Term Goals: Ability to remain free from injury will improve, Ability to verbalize frustration and anger appropriately will improve, Ability to demonstrate self-control, Ability to identify and develop effective coping behaviors will improve and Compliance with prescribed medications will improve  Medication Management: RN will administer medications as ordered by provider,  will assess and evaluate patient's response and provide education to patient for prescribed medication. RN will report any adverse and/or  side effects to prescribing provider.  Therapeutic Interventions: 1 on 1 counseling sessions, Psychoeducation, Medication administration, Evaluate responses to treatment, Monitor vital signs and CBGs as ordered, Perform/monitor CIWA, COWS, AIMS and Fall Risk screenings as ordered, Perform wound care treatments as ordered.  Evaluation of Outcomes: Progressing   LCSW Treatment Plan for Primary Diagnosis: Schizoaffective disorder, bipolar type (HCC) Long Term Goal(s): Safe transition to appropriate next level of care at discharge, Engage patient in therapeutic group addressing interpersonal concerns.  Short Term Goals: Engage patient in aftercare planning with referrals and resources, Increase social support, Identify triggers associated with mental health/substance abuse issues and Increase skills for wellness and recovery  Therapeutic Interventions: Assess for all discharge needs, 1 to 1 time with Social worker, Explore available resources and support systems, Assess for adequacy in community support network, Educate family and significant other(s) on suicide prevention, Complete Psychosocial Assessment, Interpersonal group therapy.  Evaluation of Outcomes: Progressing   Progress in Treatment: Attending groups: Yes. Participating in groups: Yes. Taking medication as prescribed: Yes. Toleration medication: Yes. Family/Significant other contact made: No, will contact:  grandmother, Lawrence Hashimotoatricia Patient understands diagnosis: Yes. Discussing patient identified problems/goals with staff: Yes. Medical problems stabilized or resolved: Yes. Denies suicidal/homicidal ideation: Yes. Issues/concerns per patient self-inventory: Yes.  New problem(s) identified: Yes, Describe:  No income  New Short Term/Long Term Goal(s): medication management for mood stabilization; elimination of SI thoughts; development of comprehensive mental wellness/sobriety plan.  Patient Goals: Mood stabilization, specifically  for "anger issues."  Discharge Plan or Barriers: Discharge home, [facility]  Reason for Continuation of Hospitalization: Aggression Anxiety Medication stabilization  Estimated Length of Stay: 1-3 days  Attendees: Patient: Lawrence [name] 12/17/2018 9:19 AM  Physician:  12/17/2018 9:19 AM  Nursing:  12/17/2018 9:19 AM  RN Care Manager: 12/17/2018 9:19 AM  Social Worker: Enid Cutterharlotte Skylarr Liz, LCSWA 12/17/2018 9:19 AM  Recreational Therapist:  12/17/2018 9:19 AM  Other:  12/17/2018 9:19 AM  Other:  12/17/2018 9:19 AM  Other: 12/17/2018 9:19 AM    Scribe for Treatment Team: Darreld Mcleanharlotte C Analyah Mcconnon, LCSWA 12/17/2018 9:19 AM

## 2018-12-17 NOTE — Progress Notes (Signed)
[facility]  12/17/2018 9:26 AM Lawrence [name]  MRN:  [id number] Subjective:    Patient is focused now on discharge he minimizes symptoms he denies any positive symptoms he does have a few negative symptoms but overall is improved and he is compliant with meds we will discuss long-acting injectable further. No EPS or TD Principal Problem: Schizoaffective disorder, bipolar type (HCC) Diagnosis: Principal Problem:   Schizoaffective disorder, bipolar type (HCC)  Total Time spent with patient: 20 minutes  Past Medical History:  Past Medical History:  Diagnosis Date  . ADD (attention deficit disorder)   . Asthma   . Depressed   . Schizoaffective disorder (HCC)    History reviewed. No pertinent surgical history. Family History: History reviewed. No pertinent family history.  Social History:  Social History   Substance and Sexual Activity  Alcohol Use Not Currently     Social History   Substance and Sexual Activity  Drug Use Yes  . Frequency: 2.0 times per week  . Types: Marijuana   Comment: Last use 12/13/2018    Social History   Socioeconomic History  . Marital status: Single    Spouse name: Not on file  . Number of children: Not on file  . Years of education: Not on file  . Highest education level: Not on file  Occupational History  . Occupation: Unemployed  Social Needs  . Financial resource strain: Not on file  . Food insecurity:    Worry: Not on file    Inability: Not on file  . Transportation needs:    Medical: Not on file    Non-medical: Not on file  Tobacco Use  . Smoking status: Current Some Day Smoker    Years: 0.00    Types: Cigarettes  . Smokeless tobacco: Never Used  Substance and Sexual Activity  . Alcohol use: Not Currently  . Drug use: Yes    Frequency: 2.0 times per week    Types: Marijuana    Comment: Last use 12/13/2018  . Sexual activity: Never  Lifestyle  . Physical activity:    Days per week: Not on file    Minutes per  session: Not on file  . Stress: Not on file  Relationships  . Social connections:    Talks on phone: Not on file    Gets together: Not on file    Attends religious service: Not on file    Active member of club or organization: Not on file    Attends meetings of clubs or organizations: Not on file    Relationship status: Not on file  Other Topics Concern  . Not on file  Social History Narrative   Pt is unemployed; lives with grandmother   Additional Social History:                         Sleep: Good  Appetite:  Good  Current Medications: Current Facility-Administered Medications  Medication Dose Route Frequency Provider Last Rate Last Dose  . alum & mag hydroxide-simeth (MAALOX/MYLANTA) 200-200-20 MG/5ML suspension 30 mL  30 mL Oral Q4H PRN Laveda AbbeParks, Laurie Britton, NP      . benztropine (COGENTIN) tablet 0.5 mg  0.5 mg Oral BID Malvin JohnsFarah, Kierra Jezewski, MD   0.5 mg at 12/17/18 0806  . carbamazepine (TEGRETOL) chewable tablet 100 mg  100 mg Oral TID Malvin JohnsFarah, Margaurite Salido, MD   100 mg at 12/17/18 0806  . hydrOXYzine (ATARAX/VISTARIL) tablet 25 mg  25 mg Oral TID  PRN Laveda Abbe, NP   25 mg at 12/16/18 2058  . risperiDONE (RISPERDAL M-TABS) disintegrating tablet 2 mg  2 mg Oral Q8H PRN Laveda Abbe, NP       And  . LORazepam (ATIVAN) tablet 1 mg  1 mg Oral PRN Laveda Abbe, NP       And  . ziprasidone (GEODON) injection 20 mg  20 mg Intramuscular PRN Laveda Abbe, NP      . magnesium hydroxide (MILK OF MAGNESIA) suspension 30 mL  30 mL Oral Daily PRN Laveda Abbe, NP      . risperiDONE (RISPERDAL) tablet 3 mg  3 mg Oral BID Malvin Johns, MD   3 mg at 12/17/18 0805  . traZODone (DESYREL) tablet 200 mg  200 mg Oral QHS Malvin Johns, MD   200 mg at 12/16/18 2058    Lab Results: No results found for this or any previous visit (from the past 48 hour(s)).  Blood Alcohol level:  Lab Results  Component Value Date   ETH <10 09/20/2017   ETH <10  05/26/2017    Metabolic Disorder Labs: Lab Results  Component Value Date   HGBA1C 5.6 12/14/2018   MPG 114.02 12/14/2018   No results found for: PROLACTIN Lab Results  Component Value Date   CHOL 106 12/14/2018   TRIG 33 12/14/2018   HDL 49 12/14/2018   CHOLHDL 2.2 12/14/2018   VLDL 7 12/14/2018   LDLCALC 50 12/14/2018    Physical Findings: AIMS: Facial and Oral Movements Muscles of Facial Expression: None, normal Lips and Perioral Area: None, normal Jaw: None, normal Tongue: None, normal,Extremity Movements Upper (arms, wrists, hands, fingers): None, normal Lower (legs, knees, ankles, toes): None, normal, Trunk Movements Neck, shoulders, hips: None, normal, Overall Severity Severity of abnormal movements (highest score from questions above): None, normal Incapacitation due to abnormal movements: None, normal Patient's awareness of abnormal movements (rate only patient's report): No Awareness, Dental Status Current problems with teeth and/or dentures?: No Does patient usually wear dentures?: No  CIWA:    COWS:     Musculoskeletal: Strength & Muscle Tone: within normal limits Gait & Station: normal Patient leans: N/A  Psychiatric Specialty Exam: Physical Exam  ROS  Blood pressure (!) 130/117, pulse 81, temperature 98.4 F (36.9 C), temperature source Oral, resp. rate 18, height 6' (1.829 m), weight 78.5 kg, SpO2 100 %.Body mass index is 23.46 kg/m.  General Appearance: Casual  Eye Contact:  Good  Speech:  Clear and Coherent  Volume:  Normal  Mood:  Euthymic  Affect:  Appropriate and Congruent  Thought Process:  Coherent and Linear  Orientation:  Full (Time, Place, and Person)  Thought Content:  Logical and Tangential  Suicidal Thoughts:  No  Homicidal Thoughts:  No  Memory:  Immediate;   Fair  Judgement:  Fair  Insight:  Fair  Psychomotor Activity:  Normal  Concentration:  Concentration: Fair  Recall:  [employer] of Knowledge:  Fair  Language:  Fair   Akathisia:  Negative  Handed:  Right  AIMS (if indicated):     Assets:  Leisure Time Physical Health  ADL's:  Intact  Cognition:  WNL  Sleep:  Number of Hours: 7.5     Treatment Plan Summary: Daily contact with patient to assess and evaluate symptoms and progress in treatment, Medication management and Plan Continue current meds and precautions discussed long-acting injectable continue reality-based therapy probable discharge 48 hours  Zanayah Shadowens, MD 12/17/2018, 9:26 AM

## 2018-12-17 NOTE — Progress Notes (Signed)
Recreation Therapy Notes  Date: 4.29.20 Time: 1000 Location: 500 Hall Dayroom  Group Topic: Wellness  Goal Area(s) Addresses:  Patient will define components of whole wellness. Patient will verbalize benefit of whole wellness.  Behavioral Response:  Minimal  Intervention:  Exercise, Music   Activity:  Exercise.  LRT lead group in a series of stretches to loosen them up before going into the exercises.  Each patient got the opportunity to lead the group in an exercise of their choice.  The group was to get at least 30 minutes of exercise.  Patients could take water breaks/breaks as needed.  Education: Wellness, Building control surveyor.   Education Outcome: Acknowledges education/In group clarification offered/Needs additional education.   Clinical Observations/Feedback:  Pt arrived late to group.  Pt stated he was feeling light headed from his medication.  Pt completed some of the exercises.  Pt sat and observed for remainder of group.   Lawrence [name], LRT/CTRS         Lillia Abed, Kalanie Fewell A 12/17/2018 11:15 AM

## 2018-12-18 MED ORDER — CARBAMAZEPINE 100 MG PO CHEW: 100.0000 mg | CHEWABLE_TABLET | Freq: Three times a day (TID) | ORAL | 2 refills | Status: DC

## 2018-12-18 MED ORDER — PALIPERIDONE PALMITATE ER 234 MG/1.5ML IM SUSY: 234.0000 mg | PREFILLED_SYRINGE | Freq: Once | INTRAMUSCULAR | 11 refills | Status: DC

## 2018-12-18 MED ORDER — TRAZODONE HCL 100 MG PO TABS: 200.0000 mg | ORAL_TABLET | Freq: Every evening | ORAL | 2 refills | Status: DC | PRN

## 2018-12-18 MED ORDER — RISPERIDONE 3 MG PO TABS: 6.0000 mg | ORAL_TABLET | Freq: Every day | ORAL | Status: DC

## 2018-12-18 MED ORDER — PALIPERIDONE PALMITATE ER 234 MG/1.5ML IM SUSY
234.0000 mg | PREFILLED_SYRINGE | Freq: Once | INTRAMUSCULAR | Status: DC
  Administered 2018-12-18: 11:00:00 234 mg via INTRAMUSCULAR

## 2018-12-18 MED ORDER — RISPERIDONE 3 MG PO TABS: 6.0000 mg | ORAL_TABLET | Freq: Every day | ORAL | 2 refills | Status: DC

## 2018-12-18 MED ORDER — BENZTROPINE MESYLATE 1 MG PO TABS: 1.0000 mg | ORAL_TABLET | Freq: Two times a day (BID) | ORAL | Status: DC

## 2018-12-18 MED ORDER — BENZTROPINE MESYLATE 1 MG PO TABS: 1.0000 mg | ORAL_TABLET | Freq: Two times a day (BID) | ORAL | 2 refills | Status: DC

## 2018-12-18 NOTE — Progress Notes (Signed)
Recreation Therapy Notes  INPATIENT RECREATION TR PLAN  Patient Details Name: Lawrence [name] MRN: [id number] DOB: 25-Feb-1996 Today's Date: 12/18/2018  Rec Therapy Plan Is patient appropriate for Therapeutic Recreation?: Yes Treatment times per week: about 3 days Estimated Length of Stay: 5-7 days TR Treatment/Interventions: Group participation (Comment)  Discharge Criteria Pt will be discharged from therapy if:: Discharged Treatment plan/goals/alternatives discussed and agreed upon by:: Patient/family  Discharge Summary Short term goals set: See patient care plan Short term goals met: Adequate for discharge Progress toward goals comments: Groups attended Which groups?: Wellness, Other (Comment)(Triggers) Reason goals not met: None Therapeutic equipment acquired: N/A Reason patient discharged from therapy: Discharge from hospital Pt/family agrees with progress & goals achieved: Yes Date patient discharged from therapy: 12/18/18     Victorino Sparrow, LRT/CTRS  Ria Comment, Carolyna Yerian A 12/18/2018, 9:32 AM

## 2018-12-18 NOTE — Progress Notes (Signed)
Pt discharged to lobby. Pt was stable and appreciative at that time. All papers, samples and prescriptions were given and valuables returned. Verbal understanding expressed. Denies SI/HI and A/VH. Pt given opportunity to express concerns and ask questions.  

## 2018-12-18 NOTE — [facility] Suicide Risk Assessment (Signed)
[facility] Discharge Suicide Risk Assessment   Principal Problem: Schizoaffective disorder, bipolar type [facility]) Discharge Diagnoses: Principal Problem:   Schizoaffective disorder, bipolar type (HCC)   Total Time spent with patient: 45 minutes  Patient alert oriented cooperative pleasant his grandmother is fine with him coming home as long as he is compliant with meds so we will administer long-acting injectable invega since he is responding well to Risperdal  Mental Status Per Nursing Assessment::   On Admission:  NA  Demographic Factors:  Male  Loss Factors: NA  Historical Factors: NA  Risk Reduction Factors:   Sense of responsibility to family and Religious beliefs about death  Continued Clinical Symptoms:  Schizophrenia:   Paranoid or undifferentiated type  Cognitive Features That Contribute To Risk:  None    Suicide Risk:  Minimal: No identifiable suicidal ideation.  Patients presenting with no risk factors but with morbid ruminations; may be classified as minimal risk based on the severity of the depressive symptoms  Follow-up Information    [facility] Follow up on 12/25/2018.   Why:  Hospital follow up appointment is Thursday, 5/7 at 9:00a.  The appointment will be held over the phone and the provider will contact you.  Contact information: [address] [place] [address] [phone]           Plan Of Care/Follow-up recommendations:  Activity:  full  Hassani Sliney, MD 12/18/2018, 9:14 AM

## 2018-12-18 NOTE — Progress Notes (Signed)
Pt attended goals/ orientation groups. Pt was appropriate. Staff discussed the unit rules and schedule.   

## 2018-12-18 NOTE — Progress Notes (Signed)
  [facility] Adult Case Management Discharge Plan :  Will you be returning to the same living situation after discharge:  Yes,  with grandmother At discharge, do you have transportation home?: Yes,  grandmother Do you have the ability to pay for your medications: No. Will work with [employer].   Release of information consent forms completed and in the chart;  Patient's signature needed at discharge.  Patient to Follow up at: Follow-up Information    [facility] Follow up on 12/25/2018.   Why:  Hospital follow up appointment is Thursday, 5/7 at 9:00a.  The appointment will be held over the phone and the provider will contact you.  Contact information: [address] [place] [address] [phone]           Next level of care provider has access to [facility] Link:no  Safety Planning and Suicide Prevention discussed: Yes,  with grandmother  Have you used any form of tobacco in the last 30 days? (Cigarettes, Smokeless Tobacco, Cigars, and/or Pipes): Patient Refused Screening  Has patient been referred to the Quitline?: Yes, faxed on 12/18/18  Patient has been referred for addiction treatment: Yes, [facility].  Lorri Frederick, LCSW 12/18/2018, 8:56 AM

## 2018-12-18 NOTE — Discharge Summary (Signed)
Physician Discharge Summary Note  Patient:  Lawrence [name] is an 23 y.o., male MRN:  [id number] DOB:  02/07/1996 Patient phone:  [phone] (home)  Patient address:   [address] [place] [address],  Total Time spent with patient: 15 minutes  Date of Admission:  12/14/2018 Date of Discharge: 12/18/18  Reason for Admission:  Psychotic behaviors  Principal Problem: Schizoaffective disorder, bipolar type [facility]) Discharge Diagnoses: Principal Problem:   Schizoaffective disorder, bipolar type [facility])   Past Psychiatric History: Per admission H&P: History of psychosis with several admissions here as recently as 2/19 Chronic polysubstance abuse but mainly that is intermittent the cannabis seems to be the drug of dependency long-term Chronic poor compliance and has been on Risperdal Consta in the past  Past Medical History:  Past Medical History:  Diagnosis Date  . ADD (attention deficit disorder)   . Asthma   . Depressed   . Schizoaffective disorder (HCC)    History reviewed. No pertinent surgical history. Family History: History reviewed. No pertinent family history. Family Psychiatric  History: denies Social History:  Social History   Substance and Sexual Activity  Alcohol Use Not Currently     Social History   Substance and Sexual Activity  Drug Use Yes  . Frequency: 2.0 times per week  . Types: Marijuana   Comment: Last use 12/13/2018    Social History   Socioeconomic History  . Marital status: Single    Spouse name: Not on file  . Number of children: Not on file  . Years of education: Not on file  . Highest education level: Not on file  Occupational History  . Occupation: Unemployed  Social Needs  . Financial resource strain: Not on file  . Food insecurity:    Worry: Not on file    Inability: Not on file  . Transportation needs:    Medical: Not on file    Non-medical: Not on file  Tobacco Use  . Smoking status: Current Some Day Smoker    Years: 0.00     Types: Cigarettes  . Smokeless tobacco: Never Used  Substance and Sexual Activity  . Alcohol use: Not Currently  . Drug use: Yes    Frequency: 2.0 times per week    Types: Marijuana    Comment: Last use 12/13/2018  . Sexual activity: Never  Lifestyle  . Physical activity:    Days per week: Not on file    Minutes per session: Not on file  . Stress: Not on file  Relationships  . Social connections:    Talks on phone: Not on file    Gets together: Not on file    Attends religious service: Not on file    Active member of club or organization: Not on file    Attends meetings of clubs or organizations: Not on file    Relationship status: Not on file  Other Topics Concern  . Not on file  Social History Narrative   Pt is unemployed; lives with grandmother    Hospital Course:  From admission assessment: Lawrence [name] is a 23 year old male who presented to [facility] as a walk-in asking for help with his anger outbursts and irritability. He also stated he has passive suicidal thought without a specific plan. He denies homicidal ideation, auditory and visual hallucinations and paranoia. He does appear to be responding to internal stimuli and his mood is non-congruent; laughing inappropriately and speaking of others as if they could hear him. He has strong body  odor. He stated he lives with his grandmother and stated he "is sick of the people and trying top practice that he is ok." He speaks in a low tone and requested that this visit happen without medications. He did agree to take Risperdal PO until he can speak to the doctor tomorrow. He was hospitalized at [facility] in 2/19 and was discharged to follow up with [facility]. He was discharged on Riperdal Consta, Risperdal PO and Trileptal. He stopped taking them shortly after discharge. Pt stated he was willing to stay at the hospital to get help. Basic labs were ordered. Pt will be admitted to the OBS unit for safety reasons and for medication restart. Pt  will be moved to inpatient when a bed becomes available.   From admission H&P: This is the the latest of multiple psychiatric admission here, Lawrence [name] is 23 years of age, he has been diagnosed with schizophrenia versus a schizoaffective type disorder, chronic cannabis abuse using approximately every other day so would probably qualifies dependency, as well as a history of intermittent explosive disorder, and a history of alcohol and alprazolam abuse. He reports a history of auditory and visual hallucinations dating back to he carries a diagnosis of 2018, the present time minimizes it stating "I used to see things but not anymore" however during his assessment prior to this admission he was clearly responding to stimuli according to staff, laughing to himself so forth. Further had a history of overdosing on cough syrup when he was 17, also dependent on cannabis than but that was his only suicide attempt and he states it was a "accident" and that he was not suicidal at the time.  Further he was abusing a compound called phenibut- So it is in this context, chronic substance abuse and cannabis dependency, history of schizoaffective disorder bipolar type, that he presented noncompliant with Risperdal, reporting depression about not working and turns about anger outburst, however it was clear upon further examination that he was in fact in a psychotic state.  Apparently has been followed up at [facility] but is not been compliant with him since February 2019, apparently he had been on Risperdal Consta and oxcarbazepine as well as the oral Risperdal. Currently he is quite pleasant he is alert and oriented to person place time situation day but not exact date he tells me he is here to get his anger under control he denies current auditory visual loose [place] denies thoughts of harming self or others no EPS or TD.  Lawrence [name] was admitted for psychosis with suicidal ideation. He was started on Risperdal, trazodone,  Tegretol, and Cogentin. He received Invega Sustenna 234 mg injection on 12/18/18. He participated in group therapy on the unit. He responded well to treatment with no adverse effects reported. He is discharging back home with his grandmother. He remained on the [facility] unit for 4 days. He stabilized with medication and therapy. He was discharged on the medications listed below. He has shown improvement with improved mood, affect, sleep, appetite, and interaction. He denies any SI/HI/AVH and contracts for safety. He agrees to follow up at [facility] (see below). He is provided with prescriptions for medications upon discharge. His grandmother is picking him up for discharge home.  Physical Findings: AIMS: Facial and Oral Movements Muscles of Facial Expression: None, normal Lips and Perioral Area: None, normal Jaw: None, normal Tongue: None, normal,Extremity Movements Upper (arms, wrists, hands, fingers): None, normal Lower (legs, knees, ankles, toes): None, normal, Trunk Movements Neck, shoulders, hips:  None, normal, Overall Severity Severity of abnormal movements (highest score from questions above): None, normal Incapacitation due to abnormal movements: None, normal Patient's awareness of abnormal movements (rate only patient's report): No Awareness, Dental Status Current problems with teeth and/or dentures?: No Does patient usually wear dentures?: No  CIWA:    COWS:     Musculoskeletal: Strength & Muscle Tone: within normal limits Gait & Station: normal Patient leans: N/A  Psychiatric Specialty Exam: Physical Exam  Nursing note and vitals reviewed. Constitutional: He is oriented to person, place, and time. He appears well-developed and well-nourished.  Cardiovascular: Normal rate.  Respiratory: Effort normal.  Neurological: He is alert and oriented to person, place, and time.    Review of Systems  Constitutional: Negative.   Psychiatric/Behavioral: Positive for hallucinations (UDS +THC).  Negative for depression, substance abuse and suicidal ideas. The patient is not nervous/anxious and does not have insomnia.     Blood pressure 103/73, pulse 77, temperature 98.3 F (36.8 C), resp. rate 20, height 6' (1.829 m), weight 78.5 kg, SpO2 100 %.Body mass index is 23.46 kg/m.  General Appearance: Casual  Eye Contact:  Good  Speech:  Normal Rate  Volume:  Normal  Mood:  Euthymic  Affect:  Appropriate and Congruent  Thought Process:  Coherent  Orientation:  Full (Time, Place, and Person)  Thought Content:  WDL  Suicidal Thoughts:  No  Homicidal Thoughts:  No  Memory:  Immediate;   Fair Recent;   Fair  Judgement:  Intact  Insight:  Fair  Psychomotor Activity:  Normal  Concentration:  Concentration: Good and Attention Span: Good  Recall:  Fair  Fund of Knowledge:  Fair  Language:  Good  Akathisia:  No  Handed:  Right  AIMS (if indicated):     Assets:  Communication Skills Housing Resilience Social Support  ADL's:  Intact  Cognition:  WNL  Sleep:  Number of Hours: 6.25     Have you used any form of tobacco in the last 30 days? (Cigarettes, Smokeless Tobacco, Cigars, and/or Pipes): Patient Refused Screening  Has this patient used any form of tobacco in the last 30 days? (Cigarettes, Smokeless Tobacco, Cigars, and/or Pipes)  No  Blood Alcohol level:  Lab Results  Component Value Date   ETH <10 09/20/2017   ETH <10 05/26/2017    Metabolic Disorder Labs:  Lab Results  Component Value Date   HGBA1C 5.6 12/14/2018   MPG 114.02 12/14/2018   No results found for: PROLACTIN Lab Results  Component Value Date   CHOL 106 12/14/2018   TRIG 33 12/14/2018   HDL 49 12/14/2018   CHOLHDL 2.2 12/14/2018   VLDL 7 12/14/2018   LDLCALC 50 12/14/2018    See Psychiatric Specialty Exam and Suicide Risk Assessment completed by Attending Physician prior to discharge.  Discharge destination:  Home  Is patient on multiple antipsychotic therapies at discharge:  No   Has  Patient had three or more failed trials of antipsychotic monotherapy by history:  No  Recommended Plan for Multiple Antipsychotic Therapies: NA   Allergies as of 12/18/2018      Reactions   Bee Venom Anaphylaxis   Apple    Corn-containing Products    Lac Bovis Itching   Milk Related   Other    Green Beans:  Other reaction(s): ASTHMA, RASH   Peanut Butter Flavor Cough      Medication List    TAKE these medications     Indication  benztropine 1 MG tablet  Commonly known as:  COGENTIN Take 1 tablet (1 mg total) by mouth 2 (two) times daily.  Indication:  Extrapyramidal Reaction caused by Medications   carbamazepine 100 MG chewable tablet Commonly known as:  TEGRETOL Chew 1 tablet (100 mg total) by mouth 3 (three) times daily.  Indication:  Manic-Depression, Dyscontrol Syndrome   paliperidone 234 MG/1.5ML Susy injection Commonly known as:  INVEGA SUSTENNA Inject 234 mg into the muscle once for 1 dose. Due 5/30  Indication:  Schizophrenia   risperiDONE 3 MG tablet Commonly known as:  RISPERDAL Take 2 tablets (6 mg total) by mouth at bedtime.  Indication:  Schizophrenia   traZODone 100 MG tablet Commonly known as:  DESYREL Take 2 tablets (200 mg total) by mouth at bedtime as needed for sleep.  Indication:  Trouble Sleeping      Follow-up Information    [facility] Follow up on 12/25/2018.   Why:  Hospital follow up appointment is Thursday, 5/7 at 9:00a.  The appointment will be held over the phone and the provider will contact you.  Contact information: [address] [place] [address] [phone]           Follow-up recommendations: Activity as tolerated. Diet as recommended by primary care physician. Keep all scheduled follow-up appointments as recommended.   Comments:   Patient is instructed to take all prescribed medications as recommended. Report any side effects or adverse reactions to your outpatient psychiatrist. Patient is instructed to abstain  from alcohol and illegal drugs while on prescription medications. In the event of worsening symptoms, patient is instructed to call the crisis hotline, 911, or go to the nearest emergency department for evaluation and treatment.  Signed: Aldean Baker, NP 12/18/2018, 9:40 AM

## 2018-12-18 NOTE — Plan of Care (Signed)
Pt was able to identify triggers and how to deal with them.   Caroll Rancher, LRT/CTRS

## 2019-11-11 ENCOUNTER — Inpatient Hospital Stay (HOSPITAL_COMMUNITY)
Admission: AD | Admit: 2019-11-11 | Discharge: 2019-11-16 | DRG: 885 | Disposition: A | Discharge status: Home / Self Care | Payer: Federal, State, Local not specified - Other | Attending: Psychiatry | Admitting: Psychiatry

## 2019-11-11 ENCOUNTER — Other Ambulatory Visit: Payer: Self-pay

## 2019-11-11 ENCOUNTER — Encounter (HOSPITAL_COMMUNITY): Payer: Self-pay | Admitting: Registered Nurse

## 2019-11-11 DIAGNOSIS — Z9114 Patient's other noncompliance with medication regimen: Secondary | ICD-10-CM

## 2019-11-11 DIAGNOSIS — F1220 Cannabis dependence, uncomplicated: Secondary | ICD-10-CM | POA: Diagnosis present

## 2019-11-11 DIAGNOSIS — F209 Schizophrenia, unspecified: Secondary | ICD-10-CM | POA: Diagnosis present

## 2019-11-11 DIAGNOSIS — G4700 Insomnia, unspecified: Secondary | ICD-10-CM | POA: Diagnosis present

## 2019-11-11 DIAGNOSIS — F200 Paranoid schizophrenia: Secondary | ICD-10-CM

## 2019-11-11 DIAGNOSIS — F250 Schizoaffective disorder, bipolar type: Principal | ICD-10-CM | POA: Diagnosis present

## 2019-11-11 DIAGNOSIS — Z20822 Contact with and (suspected) exposure to covid-19: Secondary | ICD-10-CM | POA: Diagnosis present

## 2019-11-11 DIAGNOSIS — F17210 Nicotine dependence, cigarettes, uncomplicated: Secondary | ICD-10-CM | POA: Diagnosis present

## 2019-11-11 DIAGNOSIS — Z79899 Other long term (current) drug therapy: Secondary | ICD-10-CM

## 2019-11-11 DIAGNOSIS — J45909 Unspecified asthma, uncomplicated: Secondary | ICD-10-CM | POA: Diagnosis present

## 2019-11-11 LAB — RESPIRATORY PANEL BY RT PCR (FLU A&B, COVID)
Influenza A by PCR: NEGATIVE
Influenza B by PCR: NEGATIVE
SARS Coronavirus 2 by RT PCR: NEGATIVE

## 2019-11-11 MED ORDER — HALOPERIDOL LACTATE 5 MG/ML IJ SOLN
5.0000 mg | Freq: Once | INTRAMUSCULAR | Status: AC | PRN
  Administered 2019-11-11: 22:00:00 5 mg via INTRAMUSCULAR
  Filled 2019-11-11 (×2): qty 1

## 2019-11-11 MED ORDER — ALUM & MAG HYDROXIDE-SIMETH 200-200-20 MG/5ML PO SUSP
30.0000 mL | ORAL | Status: DC | PRN
  Administered 2019-11-13: 17:00:00 30 mL via ORAL
  Filled 2019-11-11: qty 30

## 2019-11-11 MED ORDER — DIPHENHYDRAMINE HCL 50 MG/ML IJ SOLN
50.0000 mg | Freq: Once | INTRAMUSCULAR | Status: AC | PRN
  Administered 2019-11-11: 22:00:00 50 mg via INTRAMUSCULAR
  Filled 2019-11-11 (×2): qty 1

## 2019-11-11 MED ORDER — BENZTROPINE MESYLATE 1 MG PO TABS
1.0000 mg | ORAL_TABLET | Freq: Two times a day (BID) | ORAL | Status: DC
  Administered 2019-11-12 – 2019-11-16 (×9): 1 mg via ORAL
  Filled 2019-11-11 (×4): qty 1
  Filled 2019-11-11: qty 14
  Filled 2019-11-11 (×8): qty 1
  Filled 2019-11-11: qty 14
  Filled 2019-11-11 (×2): qty 1

## 2019-11-11 MED ORDER — RISPERIDONE 3 MG PO TABS
3.0000 mg | ORAL_TABLET | Freq: Two times a day (BID) | ORAL | Status: DC
  Administered 2019-11-12 – 2019-11-16 (×9): 3 mg via ORAL
  Filled 2019-11-11 (×6): qty 1
  Filled 2019-11-11: qty 14
  Filled 2019-11-11 (×2): qty 1
  Filled 2019-11-11: qty 14
  Filled 2019-11-11 (×7): qty 1

## 2019-11-11 MED ORDER — LORAZEPAM 2 MG/ML IJ SOLN
2.0000 mg | Freq: Once | INTRAMUSCULAR | Status: AC | PRN
  Administered 2019-11-11: 22:00:00 2 mg via INTRAVENOUS
  Filled 2019-11-11 (×2): qty 1

## 2019-11-11 MED ORDER — CARBAMAZEPINE 100 MG PO CHEW
100.0000 mg | CHEWABLE_TABLET | Freq: Three times a day (TID) | ORAL | Status: DC
  Filled 2019-11-11 (×6): qty 1

## 2019-11-11 MED ORDER — TRAZODONE HCL 100 MG PO TABS
200.0000 mg | ORAL_TABLET | Freq: Every evening | ORAL | Status: DC | PRN
  Filled 2019-11-11: qty 2

## 2019-11-11 NOTE — Progress Notes (Signed)
Pt accepted to [facility] Adult Unit; 508-01.     Shuvon Rankin, NP is the accepting provider.    Dr. Jeannine Kitten is the attending provider.    Call report to [phone].        Pt is IVC.    Pt may be transported by [facility]   Pt scheduled to arrive at [facility] pending negative COVID screen. Please call the unit to coordinate admission time.   Drucilla Schmidt, MSW, LCSW-A Clinical Disposition Social Worker [facility]/TTS [phone]

## 2019-11-11 NOTE — Plan of Care (Signed)
Newly admitted. Patient was irritable with active hallucinations but became more cooperative after medication intake. Patient continues to believe that Lawrence [name] is Jesus, that Lawrence [name] has to live like Jesus. Refusing meals and other services. Presents with poor hygiene reporting that Jesus does not need to eat. Safety precautions initiated.

## 2019-11-11 NOTE — Progress Notes (Signed)
Approached patient about covid swab so he can be moved to inpatient bed.  Pt. Is still refusing covid swab at this time with patient becoming visibly agitated, posturing and pacing.  [employer] and staff present, attempting to verbally deescalate patient without success.  Pt. Remains agitated but finally agrees to accept covid swab and ordered medication injections.  Pt. Medicated as ordered without incident, Will continue to monitor patient and maintain safety.

## 2019-11-11 NOTE — Progress Notes (Signed)
Patient ID: Lawrence [name], male   DOB: [birth year], 24 y.o.   MRN: [id number] Patient presents involuntarily secondary to increased disorganized thinking and behavior. Patient mother reported that he has not been taking medications. Patient was to follow up at [facility] for medication management. However, patient did not follow up.  Patient's mother reports that he has been refusing to eat; has not eaten for about a week, reporting that he is Jesus and  Does not need to eat. Patient was not very cooperative during assessment, claiming that he does not need any treatment. Patient became agitated asking nurses to discharge him. Received medications and became more cooperative and Covid19 test was performed.  Denied suicidal/homicidal thoughts. Patient refused food and beverages and said "Jesus does not need to eat...".  Patient will be admitted to Adult unit for treatment per MD  Order.

## 2019-11-11 NOTE — Tx Team (Signed)
Initial Treatment Plan 11/11/2019 11:24 PM [name] XYV:[id number]    PATIENT STRESSORS: Medication change or noncompliance Substance abuse   PATIENT STRENGTHS: Ability for insight Average or above average intelligence Communication skills Supportive family/friends   PATIENT IDENTIFIED PROBLEMS: Disturbed thought process  Medication non adherence  Substance use                 DISCHARGE CRITERIA:  Improved stabilization in mood, thinking, and/or behavior Motivation to continue treatment in a less acute level of care Verbal commitment to aftercare and medication compliance  PRELIMINARY DISCHARGE PLAN: Outpatient therapy Participate in family therapy Return to previous living arrangement  PATIENT/FAMILY INVOLVEMENT: This treatment plan has been presented to and reviewed with the patient, Lawrence [name].  The patient has been given the opportunity to ask questions and make suggestions.  Olin Pia, RN 11/11/2019, 11:24 PM

## 2019-11-11 NOTE — BH Assessment (Signed)
Assessment Note  Lawrence [name] is an 24 y.o. male who was brought to [facility] on IVC petitioned by his mother because patient is actively psychotic, he has not eaten in seven days because he states that he thinks he is Jesus and he needs to fast for forty days.  Patient has not been bathing.  Patient has not been sleeping at all and has been pacing around the house and really restless. Mother, [name] [phone], was called for collateral information. She states that patient was last hospitalized a little over a year ago, but she states that he did not follow-up with [facility] and he did not take his medications.  She states that he was smoking marijuana that it made things worse.  She states that patient has spoken about dying in the past, but he has never been suicidal or attempted suicide.  Patient has no history of homicidal thoughts.  Mother states that patient has been carrying on conversations with people who are not there.  TTS attempted to assess patient, but he was delusional and eluded to the fact that he is [employer].  His mood was labile and agitated.  He called this Probation officer a "Punk-ass bitch" several times and at one point, he postured like he was going to attack.  Because he was so labile, TTS did not try to complete the assessment,  His judgment, insight and impulse control were grossly impaired.   Diagnosis: F25 Schizoaffective Disorder   Past Medical History:  Past Medical History:  Diagnosis Date  . ADD (attention deficit disorder)   . Asthma   . Depressed   . Schizoaffective disorder ([place])     No past surgical history on file.  Family History: No family history on file.  Social History:  reports that he has been smoking cigarettes. He has smoked for the past 0.00 years. He has never used smokeless tobacco. He reports previous alcohol use. He reports current drug use. Frequency: 2.00 times per week. Drug: Marijuana.  Additional Social History:  Alcohol / Drug Use Pain  Medications: See MAR Prescriptions: See MAR Over the Counter: See MAR History of alcohol / drug use?: Yes Longest period of sobriety (when/how long): UNKNOWN Substance #1 Name of Substance 1: marijuana 1 - Age of First Use: UTA 1 - Amount (size/oz): UTA 1 - Frequency: UTA 1 - Duration: UTA 1 - Last Use / Amount: UTA  CIWA: CIWA-Ar BP: 93/67 Pulse Rate: (!) 128 COWS:    Allergies:  Allergies  Allergen Reactions  . Bee Venom Anaphylaxis  . Apple   . Corn-Containing Products   . Lac Bovis Itching    Milk Related  . Other     Green Beans:   Other reaction(s): ASTHMA, RASH  . Peanut Butter Flavor Cough    Home Medications: (Not in a hospital admission)   OB/GYN Status:  No LMP for male patient.  General Assessment Data Location of Assessment: [facility] Services TTS Assessment: In system Is this a Tele or Face-to-Face Assessment?: Face-to-Face Is this an Initial Assessment or a Re-assessment for this encounter?: Initial Assessment Patient Accompanied by:: Other(police) Language Other than English: No Living Arrangements: Other (Comment)(lives with parents) What gender do you identify as?: Male Marital status: Single Living Arrangements: Parent Can pt return to current living arrangement?: Yes Admission Status: Involuntary Petitioner: Family member Is patient capable of signing voluntary admission?: No Referral Source: Other(police) Insurance type: Not on file  Medical Screening Exam ([place]) Medical Exam completed: Yes  Crisis Care Plan Living Arrangements: Parent Legal Guardian: Other:(self) Name of Psychiatrist: none Name of Therapist: none  Education Status Is patient currently in school?: No Is the patient employed, unemployed or receiving disability?: Unemployed  Risk to self with the past 6 months Suicidal Ideation: No Has patient been a risk to self within the past 6 months prior to admission? : No Suicidal Intent: No Has  patient had any suicidal intent within the past 6 months prior to admission? : No Is patient at risk for suicide?: No Suicidal Plan?: No Has patient had any suicidal plan within the past 6 months prior to admission? : No Access to Means: No What has been your use of drugs/alcohol within the last 12 months?: no THC in 4 mths Previous Attempts/Gestures: No How many times?: 0 Other Self Harm Risks: psychotic Triggers for Past Attempts: None known Intentional Self Injurious Behavior: Cutting Comment - Self Injurious Behavior: has not cut in a long time Family Suicide History: No Recent stressful life event(s): Other (Comment) Persecutory voices/beliefs?: (psychosis) Depression: No Depression Symptoms: Despondent, Loss of interest in usual pleasures, Feeling worthless/self pity Substance abuse history and/or treatment for substance abuse?: Yes Suicide prevention information given to non-admitted patients: Not applicable  Risk to Others within the past 6 months Homicidal Ideation: No Does patient have any lifetime risk of violence toward others beyond the six months prior to admission? : No Thoughts of Harm to Others: No Current Homicidal Intent: No Current Homicidal Plan: No Access to Homicidal Means: No Identified Victim: (none) History of harm to others?: No Assessment of Violence: None Noted Violent Behavior Description: none Does patient have access to weapons?: No Criminal Charges Pending?: No Does patient have a court date: No Is patient on probation?: No  Psychosis Hallucinations: Auditory, Visual Delusions: (patient states that he is Jesus)  Mental Status Report Appearance/Hygiene: Disheveled, Poor hygiene Eye Contact: Good Motor Activity: Restlessness Speech: Pressured Level of Consciousness: Alert Mood: Labile, Suspicious Affect: Labile Anxiety Level: None Thought Processes: Flight of Ideas, Thought Blocking Judgement: Impaired Orientation: Person, Place, Time,  Situation Obsessive Compulsive Thoughts/Behaviors: Severe  Cognitive Functioning Concentration: Decreased Memory: Recent Intact, Remote Intact Is patient IDD: No Insight: Poor Impulse Control: Poor Appetite: Poor Have you had any weight changes? : Loss Amount of the weight change? (lbs): (unknown amount) Sleep: Decreased Total Hours of Sleep: (not sleeping) Vegetative Symptoms: Not bathing, Decreased grooming  ADLScreening [facility]) Patient's cognitive ability adequate to safely complete daily activities?: Yes Patient able to express need for assistance with ADLs?: Yes Independently performs ADLs?: Yes (appropriate for developmental age)  Prior Inpatient Therapy Prior Inpatient Therapy: Yes Prior Therapy Dates: Last year Prior Therapy Facilty/Provider(s): [facility] Reason for Treatment: schizoaffective  Prior Outpatient Therapy Prior Outpatient Therapy: No Does patient have an ACCT team?: No Does patient have Intensive In-House Services?  : No Does patient have [facility] services? : Yes Does patient have P4CC services?: No  ADL Screening (condition at time of admission) Patient's cognitive ability adequate to safely complete daily activities?: Yes Is the patient deaf or have difficulty hearing?: No Does the patient have difficulty seeing, even when wearing glasses/contacts?: No Does the patient have difficulty concentrating, remembering, or making decisions?: No Patient able to express need for assistance with ADLs?: Yes Does the patient have difficulty dressing or bathing?: No Independently performs ADLs?: Yes (appropriate for developmental age) Does the patient have difficulty walking or climbing stairs?: No Weakness of Legs: None Weakness of Arms/Hands: None  Home Assistive Devices/Equipment Home Assistive Devices/Equipment: None  Therapy Consults (therapy consults require a physician order) PT Evaluation Needed: No OT Evalulation Needed: No SLP  Evaluation Needed: No Abuse/Neglect Assessment (Assessment to be complete while patient is alone) Abuse/Neglect Assessment Can Be Completed: Unable to assess, patient is non-responsive or altered mental status Self-Neglect: Yes, present (Comment)(not eating or bathing) Values / Beliefs Cultural Requests During Hospitalization: None Spiritual Requests During Hospitalization: None Consults Spiritual Care Consult Needed: No Transition of Care Team Consult Needed: No Advance Directives (For Healthcare) Does Patient Have a Medical Advance Directive?: No Would patient like information on creating a medical advance directive?: No - Patient declined Nutrition Screen- [facility] Adult/[facility]/AP Has the patient recently lost weight without trying?: Yes, 2-13 lbs. Has the patient been eating poorly because of a decreased appetite?: Yes Malnutrition Screening Tool Score: 2        Disposition: Per Shuvon Rankin, NP, Inpatient is recommended Disposition Initial Assessment Completed for this Encounter: Yes Disposition of Patient: Admit Type of inpatient treatment program: Adult  On Site Evaluation by:   Reviewed with Physician:    Arnoldo Lenis Laticia Vannostrand 11/11/2019 6:20 PM

## 2019-11-11 NOTE — H&P (Signed)
Behavioral Health Medical Screening Exam  Lawrence [name] is an 24 y.o. male patient presents to [facility] as walk in vial law enforcement. IVC initiated by patients parents with complaints that patient recently treated at [facility] but has not been taking his medication, talking to himself and Jesus , aggressive behavior.  Patient evasive, paranoia, and would not answer questions directly.  Patient became irritable during assessment asking "Do you know Jesus side of the story."  Patient recommended for inpatient psychiatric treatment.   Total Time spent with patient: 30 minutes  Psychiatric Specialty Exam: Physical Exam  Vitals reviewed. Constitutional: He is oriented to person, place, and time. He appears well-developed and well-nourished. No distress.  Respiratory: Effort normal.  Musculoskeletal:        General: Normal range of motion.     Cervical back: Normal range of motion.  Neurological: He is alert and oriented to person, place, and time.  Skin: Skin is warm and dry.  Psychiatric: His speech is normal. His mood appears anxious. He is agitated and actively hallucinating. Thought content is paranoid. He expresses impulsivity. He expresses no homicidal and no suicidal ideation.    Review of Systems  Psychiatric/Behavioral: Agitation: Denies. Hallucinations: Denies. Sleep disturbance: Denies. Suicidal ideas: Denies. Nervous/anxious: Denies.   All other systems reviewed and are negative.   Blood pressure 93/67, pulse (!) 128, temperature 99 F (37.2 C), temperature source Oral, resp. rate 20, SpO2 93 %.There is no height or weight on file to calculate BMI.  General Appearance: Disheveled, Guarded and odorous  Eye Contact:  Minimal  Speech:  Clear and Coherent  Volume:  Normal  Mood:  Angry and Irritable  Affect:  Labile  Thought Process:  Coherent, Irrelevant and Linear  Orientation:  Other:  Person and place  Thought Content:  Hallucinations: Auditory, Paranoid Ideation and Tangential   Suicidal Thoughts:  Denies  Homicidal Thoughts:  Denies  Memory:  Immediate;   Poor Recent;   Poor  Judgement:  Impaired  Insight:  Lacking  Psychomotor Activity:  Normal  Concentration: Concentration: Poor and Attention Span: Poor  Recall:  Poor  Fund of Knowledge:Fair  Language: Fair  Akathisia:  No  Handed:  Right  AIMS (if indicated):     Assets:  Communication Skills Desire for Improvement Housing Social Support  Sleep:       Musculoskeletal: Strength & Muscle Tone: within normal limits Gait & Station: normal Patient leans: N/A  Blood pressure 93/67, pulse (!) 128, temperature 99 F (37.2 C), temperature source Oral, resp. rate 20, SpO2 93 %.  Recommendations:  Inpatient psychiatric treatment Based on my evaluation the patient does not appear to have an emergency medical condition.  Shameka Aggarwal, NP 11/11/2019, 4:51 PM

## 2019-11-12 DIAGNOSIS — F1220 Cannabis dependence, uncomplicated: Secondary | ICD-10-CM | POA: Diagnosis present

## 2019-11-12 DIAGNOSIS — G4700 Insomnia, unspecified: Secondary | ICD-10-CM | POA: Diagnosis present

## 2019-11-12 DIAGNOSIS — F200 Paranoid schizophrenia: Secondary | ICD-10-CM

## 2019-11-12 DIAGNOSIS — F209 Schizophrenia, unspecified: Secondary | ICD-10-CM | POA: Diagnosis not present

## 2019-11-12 DIAGNOSIS — Z9114 Patient's other noncompliance with medication regimen: Secondary | ICD-10-CM | POA: Diagnosis not present

## 2019-11-12 DIAGNOSIS — Z20822 Contact with and (suspected) exposure to covid-19: Secondary | ICD-10-CM | POA: Diagnosis present

## 2019-11-12 DIAGNOSIS — J45909 Unspecified asthma, uncomplicated: Secondary | ICD-10-CM | POA: Diagnosis present

## 2019-11-12 DIAGNOSIS — Z79899 Other long term (current) drug therapy: Secondary | ICD-10-CM | POA: Diagnosis not present

## 2019-11-12 DIAGNOSIS — F17210 Nicotine dependence, cigarettes, uncomplicated: Secondary | ICD-10-CM | POA: Diagnosis present

## 2019-11-12 DIAGNOSIS — F250 Schizoaffective disorder, bipolar type: Secondary | ICD-10-CM | POA: Diagnosis present

## 2019-11-12 LAB — CBC
HCT: 50.9 % (ref 39.0–52.0)
Hemoglobin: 16.8 g/dL (ref 13.0–17.0)
MCH: 29.6 pg (ref 26.0–34.0)
MCHC: 33 g/dL (ref 30.0–36.0)
MCV: 89.6 fL (ref 80.0–100.0)
Platelets: 213 10*3/uL (ref 150–400)
RBC: 5.68 MIL/uL (ref 4.22–5.81)
RDW: 12.3 % (ref 11.5–15.5)
WBC: 7.9 10*3/uL (ref 4.0–10.5)
nRBC: 0 % (ref 0.0–0.2)

## 2019-11-12 LAB — COMPREHENSIVE METABOLIC PANEL
ALT: 24 U/L (ref 0–44)
AST: 51 U/L — ABNORMAL HIGH (ref 15–41)
Albumin: 4.6 g/dL (ref 3.5–5.0)
Alkaline Phosphatase: 69 U/L (ref 38–126)
Anion gap: 11 (ref 5–15)
BUN: 22 mg/dL — ABNORMAL HIGH (ref 6–20)
CO2: 27 mmol/L (ref 22–32)
Calcium: 9.2 mg/dL (ref 8.9–10.3)
Chloride: 103 mmol/L (ref 98–111)
Creatinine, Ser: 1.48 mg/dL — ABNORMAL HIGH (ref 0.61–1.24)
GFR calc Af Amer: >60 mL/min (ref >60)
GFR calc non Af Amer: >60 mL/min (ref >60)
Glucose, Bld: 108 mg/dL — ABNORMAL HIGH (ref 70–99)
Potassium: 3.5 mmol/L (ref 3.5–5.1)
Sodium: 141 mmol/L (ref 135–145)
Total Bilirubin: 1.2 mg/dL (ref 0.3–1.2)
Total Protein: 7.6 g/dL (ref 6.5–8.1)

## 2019-11-12 LAB — ETHANOL: Alcohol, Ethyl (B): <10 mg/dL (ref <10)

## 2019-11-12 MED ORDER — DIPHENHYDRAMINE HCL 50 MG/ML IJ SOLN: 50.0000 mg | Freq: Once | INTRAMUSCULAR | Status: DC | PRN

## 2019-11-12 MED ORDER — LORAZEPAM 2 MG/ML IJ SOLN: 2.0000 mg | Freq: Four times a day (QID) | INTRAMUSCULAR | Status: DC | PRN

## 2019-11-12 MED ORDER — HALOPERIDOL LACTATE 5 MG/ML IJ SOLN: 5.0000 mg | Freq: Once | INTRAMUSCULAR | Status: DC | PRN

## 2019-11-12 MED ORDER — TEMAZEPAM 30 MG PO CAPS
30.0000 mg | ORAL_CAPSULE | Freq: Every day | ORAL | Status: DC
  Administered 2019-11-12: 21:00:00 30 mg via ORAL
  Filled 2019-11-12: qty 1

## 2019-11-12 MED ORDER — CARBAMAZEPINE 100 MG PO CHEW
200.0000 mg | CHEWABLE_TABLET | Freq: Three times a day (TID) | ORAL | Status: DC
  Administered 2019-11-12 – 2019-11-16 (×13): 200 mg via ORAL
  Filled 2019-11-12 (×20): qty 2

## 2019-11-12 MED ORDER — LORAZEPAM 2 MG/ML IJ SOLN: 2.0000 mg | Freq: Once | INTRAMUSCULAR | Status: DC | PRN

## 2019-11-12 MED ORDER — LORAZEPAM 1 MG PO TABS: 2.0000 mg | ORAL_TABLET | Freq: Four times a day (QID) | ORAL | Status: DC | PRN

## 2019-11-12 NOTE — Progress Notes (Signed)
Patient states that he had an "average day" today. He learned to work on controlling his anger and his rage. His goal for tomorrow is to make more friends.

## 2019-11-12 NOTE — BH Assessment (Signed)
[facility] Assessment Progress Note  Per Landry Mellow, MD, this pt requires psychiatric hospitalization.  Lawrence [name] has assigned pt to [facility] Rm [id number].  Pt presents under IVC initiated by pt's mother, and upheld by Dr Jola Babinski.  IVC documents may be found on pt's chart.  Pt's nurse, Lawrence [name], has been notified.   Lawrence [name], [place] Behavioral Health Coordinator [phone]

## 2019-11-12 NOTE — [facility] Suicide Risk Assessment (Signed)
[facility] Admission Suicide Risk Assessment   Nursing information obtained from:  Patient Demographic factors:  Male, Adolescent or young adult Current Mental Status:  NA Loss Factors:  NA Historical Factors:  NA Risk Reduction Factors:  Living with another person, especially a relative  Total Time spent with patient: 45 minutes Principal Problem: Acute psychosis substance abuse noncompliance Diagnosis:  Active Problems:   Schizophrenia (HCC)  Subjective Data: Exacerbation of psychotic disorder  Continued Clinical Symptoms:  Alcohol Use Disorder Identification Test Final Score (AUDIT): 0 The "Alcohol Use Disorders Identification Test", Guidelines for Use in Primary Care, Second Edition.  World Science writer [facility]). Score between 0-7:  no or low risk or alcohol related problems. Score between 8-15:  moderate risk of alcohol related problems. Score between 16-19:  high risk of alcohol related problems. Score 20 or above:  warrants further diagnostic evaluation for alcohol dependence and treatment.   CLINICAL FACTORS:   Schizophrenia:   Paranoid or undifferentiated type  Musculoskeletal: Strength & Muscle Tone: within normal limits Gait & Station: normal Patient leans: N/A  Psychiatric Specialty Exam: Physical Exam  Nursing note and vitals reviewed. Constitutional: He appears well-developed and well-nourished.  Cardiovascular: Normal rate and regular rhythm.    Review of Systems  Eyes: Negative.   Respiratory: Negative.   Cardiovascular: Negative.   Gastrointestinal: Negative.   Endocrine: Negative.   Genitourinary: Negative.   Musculoskeletal: Negative.     Blood pressure 114/74, pulse (!) 103, temperature 98 F (36.7 C), temperature source Oral, resp. rate 16, height 6\' 1"  (1.854 m), weight 83.9 kg, SpO2 93 %.Body mass index is 24.41 kg/m.  General Appearance: Casual  Eye Contact:  Fair  Speech:  Slow  Volume:  Decreased  Mood:  Euthymic  Affect:  Restricted   Thought Process:  Irrelevant and Descriptions of Associations: Circumstantial  Orientation:  Full (Time, Place, and Person)  Thought Content:  Illogical and Delusions  Suicidal Thoughts:  No  Homicidal Thoughts:  No  Memory:  Immediate;   Poor Recent;   Fair Remote;   Fair  Judgement:  Impaired  Insight:  Shallow  Psychomotor Activity:  Normal  Concentration:  Concentration: Fair and Attention Span: Fair  Recall:  of Knowledge:  Fair  Language:  Fair  Akathisia:  Negative  Handed:  Right  AIMS (if indicated):     Assets:  Resilience Social Support  ADL's:  Intact  Cognition:  WNL  Sleep:       Treatment Plan Summary: Daily contact with patient to assess and evaluate symptoms and progress in treatment and Medication management  Observation Level/Precautions:  15 minute checks  Laboratory:  UDS  Psychotherapy: Reality based medication and illness educationReality based  Medications:  Resume risperdal  Consultations:    Discharge Concerns:  LAI needed  Estimated LOS: 7  Other:         COGNITIVE FEATURES THAT CONTRIBUTE TO RISK:  Loss of executive function    SUICIDE RISK:   Minimal: No identifiable suicidal ideation.  Patients presenting with no risk factors but with morbid ruminations; may be classified as minimal risk based on the severity of the depressive symptoms  PLAN OF CARE: see above  I certify that inpatient services furnished can reasonably be expected to improve the patient's condition.   [employer], MD 11/12/2019, 1:54 PM

## 2019-11-12 NOTE — Tx Team (Signed)
Initial Treatment Plan 11/12/2019 12:16 PM Lawrence [name] UYE:[id number]    PATIENT STRESSORS: Medication change or noncompliance Substance abuse   PATIENT STRENGTHS: Supportive family/friends   PATIENT IDENTIFIED PROBLEMS: Psychosis  irritablity  Non-med compliance                 DISCHARGE CRITERIA:  Improved stabilization in mood, thinking, and/or behavior Verbal commitment to aftercare and medication compliance  PRELIMINARY DISCHARGE PLAN: Outpatient therapy Return to previous living arrangement Return to previous work or school arrangements  PATIENT/FAMILY INVOLVEMENT: This treatment plan has been presented to and reviewed with the patient, Lawrence [name], and/or family member,    The patient and family have been given the opportunity to ask questions and make suggestions.  Wardell Heath, RN 11/12/2019, 12:16 PM

## 2019-11-12 NOTE — Progress Notes (Signed)
D:: Patient Presents with anxious mood and affect.  Patient was irritable during admission.  Patient was out in open areas very little; he was mostly isolative in his room. .  Patient denies suicidal thoughts and self harming thoughts.    A:  Patient took scheduled medicine after much encouragement .  Support and encouragement provided Routine safety checks conducted every 15 minutes. Patient  Informed to notify staff with any concerns.  Safety maintained.  R:  No adverse drug reactions noted.  Patient contracts for safety.  Patient compliant with medication and treatment plan. Patient anxious and irritable.  Safety maintained.

## 2019-11-12 NOTE — H&P (Addendum)
Psychiatric Admission Assessment Adult  Patient Identification: Lawrence [name] MRN:  [id number] Date of Evaluation:  11/12/2019 Chief Complaint:  Schizophrenia ([place]) [F20.9] Principal Diagnosis: <principal problem not specified> Diagnosis:  Active Problems:   Schizophrenia ([place])  History of Present Illness:   This is the latest of multiple admissions for Lawrence [name], his first here since April 2020 to early May 2020, he has a known history of a schizoaffective bipolar type condition (versus schizophrenia) complicated by chronic poor compliance and chronic cannabis dependency.  Lawrence [name] presented on 3/24 under petition for involuntary commitment, parents are concerned about noncompliance, paranoia, and hyperreligiosity was noted upon presentation.  Further, he has not been eating in several days describing himself as fasting and even described himself as "Jesus" to the examiner yesterday.  Reports the patient has been noncompliant with medications and follow-up at [facility] over the past year has continued due to use cannabis probably daily, and is clearly in need of inpatient stabilization  When I interviewed him he is alert and oriented to person he states "I am going through a religious experience" and describes his fasting and "nothing is wrong with it" and then with more detailed questioning his speech tends to deteriorate to more nonsensical statements but he denies auditory or visual hallucinations at the present time or thoughts of harming self or others.  Progress screen pending lab results are nondiagnostic TSH normal   Associated Signs/Symptoms: Depression Symptoms:  insomnia, (Hypo) Manic Symptoms:  Delusions, Distractibility, Anxiety Symptoms:  n/a Psychotic Symptoms:  Delusions, PTSD Symptoms: NA Total Time spent with patient: 45 minutes  Past Psychiatric History: Extensive and again chronic poor compliance and substance abuse issues  Is the patient at risk to self? Yes.     Has the patient been a risk to self in the past 6 months? No.  Has the patient been a risk to self within the distant past? Yes.    Is the patient a risk to others? No.  Has the patient been a risk to others in the past 6 months? No.  Has the patient been a risk to others within the distant past? No.   Prior Inpatient Therapy: Prior Inpatient Therapy: Yes Prior Therapy Dates: Last year Prior Therapy Facilty/Provider(s): [facility] x [facility] Reason for Treatment: schizoaffective Prior Outpatient Therapy: Prior Outpatient Therapy: No Does patient have an ACCT team?: No Does patient have Intensive In-House Services?  : No Does patient have [facility] services? : Yes Does patient have P4CC services?: No  Alcohol Screening: 1. How often do you have a drink containing alcohol?: Never 2. How many drinks containing alcohol do you have on a typical day when you are drinking?: 1 or 2 3. How often do you have six or more drinks on one occasion?: Never AUDIT-C Score: 0 4. How often during the last year have you found that you were not able to stop drinking once you had started?: Never 5. How often during the last year have you failed to do what was normally expected from you becasue of drinking?: Never 6. How often during the last year have you needed a first drink in the morning to get yourself going after a heavy drinking session?: Never 7. How often during the last year have you had a feeling of guilt of remorse after drinking?: Never 8. How often during the last year have you been unable to remember what happened the night before because you had been drinking?: Never 9. Have you or someone else been injured  as a result of your drinking?: No 10. Has a relative or friend or a doctor or another health worker been concerned about your drinking or suggested you cut down?: No Alcohol Use Disorder Identification Test Final Score (AUDIT): 0 Alcohol Brief Interventions/Follow-up: AUDIT Score <7 follow-up not  indicated, Alcohol Education Substance Abuse History in the last 12 months:  Yes.   Consequences of Substance Abuse: NA Previous Psychotropic Medications: Yes  Psychological Evaluations: No  Past Medical History:  Past Medical History:  Diagnosis Date  . ADD (attention deficit disorder)   . Asthma   . Depressed   . Schizoaffective disorder (HCC)    History reviewed. No pertinent surgical history. Family History: History reviewed. No pertinent family history. Family Psychiatric  History: denies Tobacco Screening:   Social History:  Social History   Substance and Sexual Activity  Alcohol Use Not Currently     Social History   Substance and Sexual Activity  Drug Use Yes  . Frequency: 2.0 times per week  . Types: Marijuana   Comment: UTA recent use    Additional Social History: Marital status: Single    Pain Medications: See MAR Prescriptions: See MAR Over the Counter: See MAR History of alcohol / drug use?: Yes Longest period of sobriety (when/how long): UNKNOWN Name of Substance 1: marijuana 1 - Age of First Use: UTA 1 - Amount (size/oz): UTA 1 - Frequency: UTA 1 - Duration: UTA 1 - Last Use / Amount: UTA                  Allergies:   Allergies  Allergen Reactions  . Bee Venom Anaphylaxis  . Apple   . Corn-Containing Products   . Lac Bovis Itching    Milk Related  . Other     Green Beans:   Other reaction(s): ASTHMA, RASH  . Peanut Butter Flavor Cough   Lab Results:  Results for orders placed or performed during the hospital encounter of 11/11/19 (from the past 48 hour(s))  Respiratory Panel by RT PCR (Flu A&B, Covid) - Nasopharyngeal Swab     Status: None   Collection Time: 11/11/19  9:00 PM   Specimen: Nasopharyngeal Swab  Result Value Ref Range   SARS Coronavirus 2 by RT PCR NEGATIVE NEGATIVE    Comment: (NOTE) SARS-CoV-2 target nucleic acids are NOT DETECTED. The SARS-CoV-2 RNA is generally detectable in upper respiratoy specimens during  the acute phase of infection. The lowest concentration of SARS-CoV-2 viral copies this assay can detect is 131 copies/mL. A negative result does not preclude SARS-Cov-2 infection and should not be used as the sole basis for treatment or other patient management decisions. A negative result may occur with  improper specimen collection/handling, submission of specimen other than nasopharyngeal swab, presence of viral mutation(s) within the areas targeted by this assay, and inadequate number of viral copies (<131 copies/mL). A negative result must be combined with clinical observations, patient history, and epidemiological information. The expected result is Negative. Fact Sheet for Patients:  https://www.moore.com/ Fact Sheet for Healthcare Providers:  https://www.young.biz/ This test is not yet ap proved or cleared by the [place] FDA and  has been authorized for detection and/or diagnosis of SARS-CoV-2 by FDA under an Emergency Use Authorization (EUA). This EUA will remain  in effect (meaning this test can be used) for the duration of the COVID-19 declaration under Section 564(b)(1) of the Act, 21 U.S.C. section 360bbb-3(b)(1), unless the authorization is terminated or revoked sooner.    Influenza  A by PCR NEGATIVE NEGATIVE   Influenza B by PCR NEGATIVE NEGATIVE    Comment: (NOTE) The Xpert Xpress SARS-CoV-2/FLU/RSV assay is intended as an aid in  the diagnosis of influenza from Nasopharyngeal swab specimens and  should not be used as a sole basis for treatment. Nasal washings and  aspirates are unacceptable for Xpert Xpress SARS-CoV-2/FLU/RSV  testing. Fact Sheet for Patients: https://www.moore.com/ Fact Sheet for Healthcare Providers: https://www.young.biz/ This test is not yet approved or cleared by the [place] FDA and  has been authorized for detection and/or diagnosis of SARS-CoV-2 by  FDA  under an Emergency Use Authorization (EUA). This EUA will remain  in effect (meaning this test can be used) for the duration of the  Covid-19 declaration under Section 564(b)(1) of the Act, 21  U.S.C. section 360bbb-3(b)(1), unless the authorization is  terminated or revoked. Performed at [facility], [address] [place], [place] [address]     Blood Alcohol level:  Lab Results  Component Value Date   [facility] <10 09/20/2017   ETH <10 05/26/2017    Metabolic Disorder Labs:  Lab Results  Component Value Date   HGBA1C 5.6 12/14/2018   MPG 114.02 12/14/2018   No results found for: PROLACTIN Lab Results  Component Value Date   CHOL 106 12/14/2018   TRIG 33 12/14/2018   HDL 49 12/14/2018   CHOLHDL 2.2 12/14/2018   VLDL 7 12/14/2018   LDLCALC 50 12/14/2018    Current Medications: Current Facility-Administered Medications  Medication Dose Route Frequency Provider Last Rate Last Admin  . alum & mag hydroxide-simeth (MAALOX/MYLANTA) 200-200-20 MG/5ML suspension 30 mL  30 mL Oral Q4H PRN Rankin, Shuvon B, NP      . benztropine (COGENTIN) tablet 1 mg  1 mg Oral BID Malvin Johns, MD   1 mg at 11/12/19 1120  . carbamazepine (TEGRETOL) chewable tablet 200 mg  200 mg Oral TID Antonieta Pert, MD   200 mg at 11/12/19 1120  . haloperidol lactate (HALDOL) injection 5 mg  5 mg Intramuscular Once PRN Antonieta Pert, MD       And  . LORazepam (ATIVAN) injection 2 mg  2 mg Intravenous Once PRN Antonieta Pert, MD       And  . diphenhydrAMINE (BENADRYL) injection 50 mg  50 mg Intramuscular Once PRN Antonieta Pert, MD      . LORazepam (ATIVAN) tablet 2 mg  2 mg Oral Q6H PRN Malvin Johns, MD       Or  . LORazepam (ATIVAN) injection 2 mg  2 mg Intramuscular Q6H PRN Malvin Johns, MD      . risperiDONE (RISPERDAL) tablet 3 mg  3 mg Oral BID Malvin Johns, MD   3 mg at 11/12/19 1120  . temazepam (RESTORIL) capsule 30 mg  30 mg Oral QHS Malvin Johns, MD      .  traZODone (DESYREL) tablet 200 mg  200 mg Oral QHS PRN Malvin Johns, MD       PTA Medications: Medications Prior to Admission  Medication Sig Dispense Refill Last Dose  . benztropine (COGENTIN) 1 MG tablet Take 1 tablet (1 mg total) by mouth 2 (two) times daily. (Patient not taking: Reported on 11/12/2019) 60 tablet 2 Not Taking at Unknown time  . carbamazepine (TEGRETOL) 100 MG chewable tablet Chew 1 tablet (100 mg total) by mouth 3 (three) times daily. (Patient not taking: Reported on 11/12/2019) 90 tablet 2 Not Taking at Unknown time  .  paliperidone (INVEGA SUSTENNA) 234 MG/1.5ML SUSY injection Inject 234 mg into the muscle once for 1 dose. Due 5/30 1.5 mL 11   . risperiDONE (RISPERDAL) 3 MG tablet Take 2 tablets (6 mg total) by mouth at bedtime. (Patient not taking: Reported on 11/12/2019) 60 tablet 2 Not Taking at Unknown time  . traZODone (DESYREL) 100 MG tablet Take 2 tablets (200 mg total) by mouth at bedtime as needed for sleep. (Patient not taking: Reported on 11/12/2019) 60 tablet 2 Not Taking at Unknown time    Musculoskeletal: Strength & Muscle Tone: within normal limits Gait & Station: normal Patient leans: N/A  Psychiatric Specialty Exam: Physical Exam  Nursing note and vitals reviewed. Constitutional: He appears well-developed and well-nourished.  Cardiovascular: Normal rate and regular rhythm.    Review of Systems  Eyes: Negative.   Respiratory: Negative.   Cardiovascular: Negative.   Gastrointestinal: Negative.   Endocrine: Negative.   Genitourinary: Negative.   Musculoskeletal: Negative.     Blood pressure 114/74, pulse (!) 103, temperature 98 F (36.7 C), temperature source Oral, resp. rate 16, height 6\' 1"  (1.854 m), weight 83.9 kg, SpO2 93 %.Body mass index is 24.41 kg/m.  General Appearance: Casual  Eye Contact:  Fair  Speech:  Slow  Volume:  Decreased  Mood:  Euthymic  Affect:  Restricted  Thought Process:  Irrelevant and Descriptions of Associations:  Circumstantial  Orientation:  Full (Time, Place, and Person)  Thought Content:  Illogical and Delusions  Suicidal Thoughts:  No  Homicidal Thoughts:  No  Memory:  Immediate;   Poor Recent;   Fair Remote;   Fair  Judgement:  Impaired  Insight:  Shallow  Psychomotor Activity:  Normal  Concentration:  Concentration: Fair and Attention Span: Fair  Recall:  of Knowledge:  Fair  Language:  Fair  Akathisia:  Negative  Handed:  Right  AIMS (if indicated):     Assets:  Resilience Social Support  ADL's:  Intact  Cognition:  WNL  Sleep:       Treatment Plan Summary: Daily contact with patient to assess and evaluate symptoms and progress in treatment and Medication management  Observation Level/Precautions:  15 minute checks  Laboratory:  UDS  Psychotherapy: Reality based medication and illness educationReality based  Medications:  Resume risperdal  Consultations:    Discharge Concerns:  LAI needed  Estimated LOS: 7  Other:     Physician Treatment Plan for Primary Diagnosis:   Clearly in need of long-acting injectable Axis I-schizophrenic disorder versus schizoaffective acute exacerbation complicated by chronic poor compliance with visits, medications and continued cannabis usage Axis II deferred Axis III no acute medical issues Long Term Goal(s): Improvement in symptoms so as ready for discharge  Short Term Goals: Ability to disclose and discuss suicidal ideas, Ability to demonstrate self-control will improve and Ability to identify and develop effective coping behaviors will improve  Physician Treatment Plan for Secondary Diagnosis: Active Problems:   Schizophrenia (HCC)  Long Term Goal(s): Improvement in symptoms so as ready for discharge  Short Term Goals: Ability to identify changes in lifestyle to reduce recurrence of condition will improve, Ability to verbalize feelings will improve, Ability to disclose and discuss suicidal ideas, Ability to demonstrate  self-control will improve and Ability to maintain clinical measurements within normal limits will improve  I certify that inpatient services furnished can reasonably be expected to improve the patient's condition.    [employer], MD 3/25/20211:46 PM

## 2019-11-12 NOTE — Progress Notes (Signed)
Patient ID: Lawrence [name], male   DOB: 10-Jan-1996, 24 y.o.   MRN: [id number] Patient is seen and examined.  Patient is a 24 year old male with a past psychiatric history significant for schizophrenia who was brought to the behavioral health hospital under involuntary commitment on 11/11/2019.  He was considered to be actively psychotic.  His mother reported that he had not been taking his medications.  On examination today he is mildly sedated.  He had become more agitated last evening, and required intramuscular medication.  He does have a bed that has been accepted and on [address].  Given his agitation I will go on and increase his Tegretol to 200 mg p.o. 3 times daily.  No change in his Risperdal at this point.  I have also renewed his agitation protocol medications if necessary.  Hopefully we will be able to get him to the [address] today.

## 2019-11-12 NOTE — Progress Notes (Signed)
Patient issued numerous threats to [facility] and displayed both verbally and aggressive behavior towards all staff members present.

## 2019-11-12 NOTE — Progress Notes (Addendum)
Adult Admission Note  Pt is a 24 yo male that presents IVC'd on 11/12/2019 to the adult unit. Pt is pleasant and looks flat/blank in affect. Pt has been pleasant since arriving. Pt has been compliant with medication administration. Pt denies any physical complaints or pain. Pt has been viewed multiple times interacting in the milieu. Pt denies any current si/hi/ah/vh and verbally agrees to approach staff if these become apparent or before harming self/others while at [facility]. Consents signed, skin/belongings search completed and patient oriented to unit. Patient stable at this time. Patient given the opportunity to express concerns and ask questions. Patient given toiletries. Will continue to monitor.   [facility] Assessment 11/11/2019:  Lawrence [name] is an 24 y.o. male who was brought to [facility] on IVC petitioned by his mother because patient is actively psychotic, he has not eaten in seven days because he states that he thinks he is Jesus and he needs to fast for forty days.  Patient has not been bathing.  Patient has not been sleeping at all and has been pacing around the house and really restless. Mother, Olevia Perches [phone], was called for collateral information. She states that patient was last hospitalized a little over a year ago, but she states that he did not follow-up with [facility] and he did not take his medications.  She states that he was smoking marijuana that it made things worse.  She states that patient has spoken about dying in the past, but he has never been suicidal or attempted suicide.  Patient has no history of homicidal thoughts.  Mother states that patient has been carrying on conversations with people who are not there.  TTS attempted to assess patient, but he was delusional and eluded to the fact that he is [employer].  His mood was labile and agitated.  He called this Clinical research associate a "Punk-ass bitch" several times and at one point, he postured like he was going to attack.  Because he was so  labile, TTS did not try to complete the assessment,  His judgment, insight and impulse control were grossly impaired.

## 2019-11-13 DIAGNOSIS — F209 Schizophrenia, unspecified: Secondary | ICD-10-CM

## 2019-11-13 MED ORDER — TRAZODONE HCL 100 MG PO TABS: 100.0000 mg | ORAL_TABLET | Freq: Every evening | ORAL | Status: DC | PRN

## 2019-11-13 MED ORDER — TEMAZEPAM 15 MG PO CAPS
15.0000 mg | ORAL_CAPSULE | Freq: Every day | ORAL | Status: DC
  Administered 2019-11-13 – 2019-11-14 (×2): 15 mg via ORAL
  Filled 2019-11-13 (×3): qty 1

## 2019-11-13 NOTE — Progress Notes (Addendum)
[facility] MD Progress Note  11/13/2019 11:56 AM Lawrence [name]  MRN:  [id number] Subjective:   "  I am feeling all right today".  Reports that medications are causing some mild dizziness, denies other side effects.  Reports he is eating better since admission and states he had most of his breakfast this morning and plans to eat well at lunch later today. Objective : I have discussed case with treatment team and have met with patient. 24 year old male, lives with grandmother, known to [facility] from prior psychiatric admissions , has been diagnosed with Schizoaffective Disorder versus Schizophrenia in the past . He reports past use of cannabis but reports he has been abstinent for about 4 months .  Patient presented under commitment initiated by his parents.  Reportedly was not taking his psychiatric medications, presenting with disorganized behavior, irritability/agitation internally preoccupied refusing to eat, not taking proper care of ADLs, stating that he is Jesus.  Today patient presents alert, attentive.  Polite during assessment although vaguely irritable.  Repeatedly reports he does not think he needs to be in the hospital.  He continues to express grandiose ideations, though appears less focused on these , and focuses more on stating that he is "feeling okay" and hoping for discharge soon.   Makes statements such as "I am gifted in intellect and strength, I  appreciate beauty ,I am the alpha, like the Vitruvian man".  At this time, however, he is not presenting particularly irritable or expansive and affect.  His speech is not pressured or loud. Regarding p.o. intake, states he had been fasting on purpose prior to admission and reports he is now eating normally.  Currently is well in dayroom, no overtly agitated or disruptive behaviors, limited to group/milieu participation, although chart reported that patient did have an episode of making threats and behaving aggressively towards staff members yesterday  evening. Currently denies medication side effects. Denies suicidal ideations Labs reviewed-creatinine elevated to 1.48, BUN 22.  As noted patient does report he was fasting for several days prior to admission.  CBC unremarkable.  Principal Problem: Schizoaffective disorder  Diagnosis: Active Problems:   Schizophrenia ([place])  Total Time spent with patient: 20 minutes  Past Psychiatric History:   Past Medical History:  Past Medical History:  Diagnosis Date  . ADD (attention deficit disorder)   . Asthma   . Depressed   . Schizoaffective disorder ([place])    History reviewed. No pertinent surgical history. Family History: History reviewed. No pertinent family history. Family Psychiatric  History:  Social History:  Social History   Substance and Sexual Activity  Alcohol Use Not Currently     Social History   Substance and Sexual Activity  Drug Use Yes  . Frequency: 2.0 times per week  . Types: Marijuana   Comment: UTA recent use    Social History   Socioeconomic History  . Marital status: Single    Spouse name: Not on file  . Number of children: Not on file  . Years of education: Not on file  . Highest education level: Not on file  Occupational History  . Occupation: Unemployed  Tobacco Use  . Smoking status: Current Some Day Smoker    Years: 0.00    Types: Cigarettes  . Smokeless tobacco: Never Used  Substance and Sexual Activity  . Alcohol use: Not Currently  . Drug use: Yes    Frequency: 2.0 times per week    Types: Marijuana    Comment: UTA recent use  .  Sexual activity: Never  Other Topics Concern  . Not on file  Social History Narrative   Pt is unemployed; lives with grandmother   Social Determinants of Health   Financial Resource Strain:   . Difficulty of Paying Living Expenses:   Food Insecurity:   . Worried About Charity fundraiser in the Last Year:   . Arboriculturist in the Last Year:   Transportation Needs:   . Film/video editor  (Medical):   Marland Kitchen Lack of Transportation (Non-Medical):   Physical Activity:   . Days of Exercise per Week:   . Minutes of Exercise per Session:   Stress:   . Feeling of Stress :   Social Connections:   . Frequency of Communication with Friends and Family:   . Frequency of Social Gatherings with Friends and Family:   . Attends Religious Services:   . Active Member of Clubs or Organizations:   . Attends Archivist Meetings:   Marland Kitchen Marital Status:    Additional Social History:    Pain Medications: See MAR Prescriptions: See MAR Over the Counter: See MAR History of alcohol / drug use?: Yes Longest period of sobriety (when/how long): UNKNOWN Name of Substance 1: marijuana 1 - Age of First Use: UTA 1 - Amount (size/oz): UTA 1 - Frequency: UTA 1 - Duration: UTA 1 - Last Use / Amount: UTA   Sleep: Improving  Appetite:  Improving  Current Medications: Current Facility-Administered Medications  Medication Dose Route Frequency Provider Last Rate Last Admin  . alum & mag hydroxide-simeth (MAALOX/MYLANTA) 200-200-20 MG/5ML suspension 30 mL  30 mL Oral Q4H PRN Rankin, Shuvon B, NP      . benztropine (COGENTIN) tablet 1 mg  1 mg Oral BID Johnn Hai, MD   1 mg at 11/12/19 2058  . carbamazepine (TEGRETOL) chewable tablet 200 mg  200 mg Oral TID Sharma Covert, MD   200 mg at 11/12/19 2058  . haloperidol lactate (HALDOL) injection 5 mg  5 mg Intramuscular Once PRN Sharma Covert, MD       And  . LORazepam (ATIVAN) injection 2 mg  2 mg Intravenous Once PRN Sharma Covert, MD       And  . diphenhydrAMINE (BENADRYL) injection 50 mg  50 mg Intramuscular Once PRN Sharma Covert, MD      . LORazepam (ATIVAN) tablet 2 mg  2 mg Oral Q6H PRN Johnn Hai, MD       Or  . LORazepam (ATIVAN) injection 2 mg  2 mg Intramuscular Q6H PRN Johnn Hai, MD      . risperiDONE (RISPERDAL) tablet 3 mg  3 mg Oral BID Johnn Hai, MD   3 mg at 11/12/19 2057  . temazepam (RESTORIL)  capsule 30 mg  30 mg Oral QHS Johnn Hai, MD   30 mg at 11/12/19 2057  . traZODone (DESYREL) tablet 200 mg  200 mg Oral QHS PRN Johnn Hai, MD        Lab Results:  Results for orders placed or performed during the hospital encounter of 11/11/19 (from the past 48 hour(s))  Respiratory Panel by RT PCR (Flu A&B, Covid) - Nasopharyngeal Swab     Status: None   Collection Time: 11/11/19  9:00 PM   Specimen: Nasopharyngeal Swab  Result Value Ref Range   SARS Coronavirus 2 by RT PCR NEGATIVE NEGATIVE    Comment: (NOTE) SARS-CoV-2 target nucleic acids are NOT DETECTED. The SARS-CoV-2 RNA is generally  detectable in upper respiratoy specimens during the acute phase of infection. The lowest concentration of SARS-CoV-2 viral copies this assay can detect is 131 copies/mL. A negative result does not preclude SARS-Cov-2 infection and should not be used as the sole basis for treatment or other patient management decisions. A negative result may occur with  improper specimen collection/handling, submission of specimen other than nasopharyngeal swab, presence of viral mutation(s) within the areas targeted by this assay, and inadequate number of viral copies (<131 copies/mL). A negative result must be combined with clinical observations, patient history, and epidemiological information. The expected result is Negative. Fact Sheet for Patients:  PinkCheek.be Fact Sheet for Healthcare Providers:  GravelBags.it This test is not yet ap proved or cleared by the [place] FDA and  has been authorized for detection and/or diagnosis of SARS-CoV-2 by FDA under an Emergency Use Authorization (EUA). This EUA will remain  in effect (meaning this test can be used) for the duration of the COVID-19 declaration under Section 564(b)(1) of the Act, 21 U.S.C. section 360bbb-3(b)(1), unless the authorization is terminated or revoked sooner.    Influenza  A by PCR NEGATIVE NEGATIVE   Influenza B by PCR NEGATIVE NEGATIVE    Comment: (NOTE) The Xpert Xpress SARS-CoV-2/FLU/RSV assay is intended as an aid in  the diagnosis of influenza from Nasopharyngeal swab specimens and  should not be used as a sole basis for treatment. Nasal washings and  aspirates are unacceptable for Xpert Xpress SARS-CoV-2/FLU/RSV  testing. Fact Sheet for Patients: PinkCheek.be Fact Sheet for Healthcare Providers: GravelBags.it This test is not yet approved or cleared by the [place] FDA and  has been authorized for detection and/or diagnosis of SARS-CoV-2 by  FDA under an Emergency Use Authorization (EUA). This EUA will remain  in effect (meaning this test can be used) for the duration of the  Covid-19 declaration under Section 564(b)(1) of the Act, 21  U.S.C. section 360bbb-3(b)(1), unless the authorization is  terminated or revoked. Performed at [facility], [place] [address] [place], [place] [address]   CBC     Status: None   Collection Time: 11/12/19  6:33 PM  Result Value Ref Range   WBC 7.9 4.0 - 10.5 K/uL   RBC 5.68 4.22 - 5.81 MIL/uL   Hemoglobin 16.8 13.0 - 17.0 g/dL   HCT 50.9 39.0 - 52.0 %   MCV 89.6 80.0 - 100.0 fL   MCH 29.6 26.0 - 34.0 pg   MCHC 33.0 30.0 - 36.0 g/dL   RDW 12.3 11.5 - 15.5 %   Platelets 213 150 - 400 K/uL   nRBC 0.0 0.0 - 0.2 %    Comment: Performed at [facility], [place] [address] [place], [place] [address]  Comprehensive metabolic panel     Status: Abnormal   Collection Time: 11/12/19  6:33 PM  Result Value Ref Range   Sodium 141 135 - 145 mmol/L   Potassium 3.5 3.5 - 5.1 mmol/L   Chloride 103 98 - 111 mmol/L   CO2 27 22 - 32 mmol/L   Glucose, Bld 108 (H) 70 - 99 mg/dL    Comment: Glucose reference range applies only to samples taken after fasting for at least 8 hours.   BUN 22 (H) 6 - 20 mg/dL   Creatinine, Ser 1.48 (H)  0.61 - 1.24 mg/dL   Calcium 9.2 8.9 - 10.3 mg/dL   Total Protein 7.6 6.5 - 8.1 g/dL   Albumin 4.6 3.5 - 5.0  g/dL   AST 51 (H) 15 - 41 U/L   ALT 24 0 - 44 U/L   Alkaline Phosphatase 69 38 - 126 U/L   Total Bilirubin 1.2 0.3 - 1.2 mg/dL   GFR calc non Af Amer >60 >60 mL/min   GFR calc Af Amer >60 >60 mL/min   Anion gap 11 5 - 15    Comment: Performed at [facility], [place] [address], [place], [place] [address]  Ethanol     Status: None   Collection Time: 11/12/19  6:33 PM  Result Value Ref Range   Alcohol, Ethyl (B) <10 <10 mg/dL    Comment: (NOTE) Lowest detectable limit for serum alcohol is 10 mg/dL. For medical purposes only. Performed at [facility], [place] [address], [place], [place] [address]     Blood Alcohol level:  Lab Results  Component Value Date   ETH <10 11/12/2019   ETH <10 69/48/5462    Metabolic Disorder Labs: Lab Results  Component Value Date   HGBA1C 5.6 12/14/2018   MPG 114.02 12/14/2018   No results found for: PROLACTIN Lab Results  Component Value Date   CHOL 106 12/14/2018   TRIG 33 12/14/2018   HDL 49 12/14/2018   CHOLHDL 2.2 12/14/2018   VLDL 7 12/14/2018   LDLCALC 50 12/14/2018    Physical Findings: AIMS: Facial and Oral Movements Muscles of Facial Expression: None, normal Lips and Perioral Area: None, normal Jaw: None, normal Tongue: None, normal, ,  ,  ,    CIWA:  CIWA-Ar Total: 0 COWS:     Musculoskeletal: Strength & Muscle Tone: within normal limits Gait & Station: normal Patient leans: N/A  Psychiatric Specialty Exam: Physical Exam  Review of Systems currently denies headache, denies chest pain or shortness of breath, no vomiting  Blood pressure 102/66, pulse (!) 115, temperature 97.9 F (36.6 C), temperature source Oral, resp. rate 16, height '6\' 1"'$  (1.854 m), weight 83.9 kg, SpO2 93 %.Body mass index is 24.41 kg/m.  General Appearance: Fairly Groomed  Eye Contact:  Good  Speech:   Normal Rate  Volume:  Not pressured  Mood:  Reports mood as "all right", appears labile  Affect:  Some affective lability but not overtly irritable or agitated at this time.  Thought Process: Noted to become disorganized with open-ended questions, tangential at times  Orientation:  Other:  Fully alert and attentive  Thought Content:  Denies hallucinations,(+) grandiose/delusional ideations as above  Suicidal Thoughts:  No currently denies suicidal or self-injurious ideations, also denies any homicidal or violent ideations  Homicidal Thoughts:  No  Memory:  Recent and remote fair  Judgement:  Other:  Fair  Insight:  Shallow  Psychomotor Activity:  Normal-no current psychomotor agitation  Concentration:  Concentration: Fair and Attention Span: Fair  Recall:  Good  Fund of Knowledge:  Good  Language:  Good  Akathisia:  Negative  Handed:  Right  AIMS (if indicated):     Assets:  Communication Skills Desire for Improvement Resilience  ADL's:  Intact  Cognition:  WNL  Sleep:  Number of Hours: 6.5   Assessment:  24 year old male, lives with grandmother, known to [facility] from prior psychiatric admissions , has been diagnosed with Schizoaffective Disorder versus Schizophrenia in the past . He reports past use of cannabis but reports he has been abstinent for about 4 months .  Patient presented under commitment initiated by his parents.  Reportedly was not taking his psychiatric medications, presenting  with disorganized behavior, irritability/agitation internally preoccupied refusing to eat, not taking proper care of ADLs, stating that he is Jesus.  Currently patient presents with some improvement.  At this time does not appear irritable or overtly agitated.  He does continue to present with psychotic/delusional ideations but appears less intensely preoccupied on these, insight is limited.  He denies SI or HI.  His p.o. intake has improved.  Currently denies medication side effects Treatment Plan  Summary: Daily contact with patient to assess and evaluate symptoms and progress in treatment, Medication management, Plan Inpatient treatment and Medications as below Encourage group and milieu participation to work on coping skills and equal strength Treatment team working on disposition planning options Continue Risperidone 3 mg twice daily for psychosis Continue Tegretol 200 mg 3 times daily for mood disorder Continue Cogentin at 1 mg twice daily to minimize/prevent EPS Decrease Restoril to 15 mg nightly for insomnia Decrease Trazodone to 100 mg nightly as needed for insomnia Continue Ativan 2 mgr Q 6 hours PO or IM for agitation as needed  Check HgbA1C, Lipid Panel, Prolactin, EKG to monitor QTc , TSH, Carbamazepine serum level, repeat BUN/Creatinine in AM to monitor Encourage PO fluids   Jenne Campus, MD 11/13/2019, 11:56 AM

## 2019-11-13 NOTE — BHH Group Notes (Signed)
LCSW Group Therapy Note  11/13/2019 10:30 AM  Type of Therapy and Topic:  Group Therapy:  Feelings around Relapse and Recovery  Participation Level:  Minimal   Description of Group:    Patients in this group will discuss emotions they experience before and after a relapse. They will process how experiencing these feelings, or avoidance of experiencing them, relates to having a relapse. Facilitator will guide patients to explore emotions they have related to recovery. Patients will be encouraged to process which emotions are more powerful. They will be guided to discuss the emotional reaction significant others in their lives may have to their relapse or recovery. Patients will be assisted in exploring ways to respond to the emotions of others without this contributing to a relapse.  Therapeutic Goals: 1. Patient will identify two or more emotions that lead to a relapse for them 2. Patient will identify two emotions that result when they relapse 3. Patient will identify two emotions related to recovery 4. Patient will demonstrate ability to communicate their needs through discussion and/or role plays   Summary of Patient Progress: Pt was present in group. Pt identified his mental health goal as being calmer and having a more chill disposition. Pt did not engage much throughout the rest of the group session.    Therapeutic Modalities:   Cognitive Behavioral Therapy Solution-Focused Therapy Assertiveness Training Relapse Prevention Therapy   Iris Pert, MSW, LCSW Clinical Social Work 11/13/2019 10:30 AM
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
## 2019-11-13 NOTE — Progress Notes (Signed)
Adult Psychoeducational Group Note  Date:  11/13/2019 Time:  11:33 PM  Group Topic/Focus:  Wrap-Up Group:   The focus of this group is to help patients review their daily goal of treatment and discuss progress on daily workbooks.  Participation Level:  Minimal  Participation Quality:  Appropriate  Affect:  Appropriate  Cognitive:  Alert and Appropriate  Insight: Appropriate  Engagement in Group:  Developing/Improving  Modes of Intervention:  Discussion  Additional Comments:  Pt stated his goal for today was to focus on his treatment plan. Pt stated he accomplished his goal today. Pt stated his relationship with his family has improved since he was admitted. Pt stated been able to contact his mother and his grandmother today improved his day. Pt stated he felt better about himself today. Pt rated his overall day an 5 out of 10. Pt stated his appetite was pretty good today. Pt stated his sleep last night was pretty good. Pt stated he was in no physical pain today.  Pt deny auditory or visual hallucinations. Pt denies thoughts of harming himself or others. Pt stated he would alert staff if anything changes.  Felipa Furnace 11/13/2019, 11:33 PM

## 2019-11-13 NOTE — Progress Notes (Signed)
NUTRITION ASSESSMENT  Pt identified as at risk on the Malnutrition Screen Tool  INTERVENTION: No nutrition interventions warranted at this time. If nutrition issues arise, please consult RD.  Assessment:  RD working remotely.  24 y.o. male presented on 3/24 under petition for IVC due to parents concern about noncompliance, paranoia and hyperreligiosity with history of schizoaffective bipolar type condition complicated by chronic poor medication compliance and chronic cannabis dependency.  Per chart review, parents report that patient has not eaten in several days, describing himself as fasting and described himself as "Jesus" to the examiner. He reports going through a religious experience and reports the is nothing wrong with fasting. Per progress notes today, patient reports medications are causing mild dizziness, denies other side effects and endorses eating better since admission, recalls eating most of his breakfast and planned to eat well at lunch today.   Current wt 184.58 lbs Last weight prior to admission 172.7 lbs recorded on 12/14/18. Unable to assess recent trends and patient is within normal range for BMI.  No nutrition needs identified at this time. He is on a regular diet, eating as desired. Pt is also offered choice of unit snacks mid-morning and mid-afternoon.    Height: Ht Readings from Last 1 Encounters:  11/12/19 6\' 1"  (1.854 m)    Weight: Wt Readings from Last 1 Encounters:  11/12/19 83.9 kg    Weight Hx: Wt Readings from Last 10 Encounters:  11/12/19 83.9 kg  12/14/18 78.5 kg  09/20/17 83.9 kg  05/26/17 82.1 kg  01/09/17 81.9 kg  01/08/17 84.8 kg  11/22/16 81.6 kg  05/13/16 80.7 kg (79 %, Z= 0.81)*  05/12/16 88.5 kg (90 %, Z= 1.29)*  07/12/14 81.7 kg (86 %, Z= 1.10)*   * Growth percentiles are based on CDC (Boys, 2-20 Years) data.    BMI:  Body mass index is 24.41 kg/m. Pt meets criteria for normal based on current BMI.  Estimated Nutritional  Needs: Kcal: 25-30 kcal/kg Protein: > 1 gram protein/kg Fluid: 1 ml/kcal  Diet Order:  Diet Order            Diet regular Room service appropriate? Yes; Fluid consistency: Thin  Diet effective now              Lab results and medications reviewed.   07/14/14, RD, LDN Clinical Nutrition After Hours/Weekend Pager # in Amion

## 2019-11-13 NOTE — Tx Team (Signed)
Interdisciplinary Treatment and Diagnostic Plan Update  11/13/2019 Time of Session: 900am [name] MRN: [id number]  Principal Diagnosis: <principal problem not specified>  Secondary Diagnoses: Active Problems:   Schizophrenia ([place])   Current Medications:  Current Facility-Administered Medications  Medication Dose Route Frequency Provider Last Rate Last Admin  . alum & mag hydroxide-simeth (MAALOX/MYLANTA) 200-200-20 MG/5ML suspension 30 mL  30 mL Oral Q4H PRN Rankin, Shuvon B, NP      . benztropine (COGENTIN) tablet 1 mg  1 mg Oral BID Johnn Hai, MD   1 mg at 11/12/19 2058  . carbamazepine (TEGRETOL) chewable tablet 200 mg  200 mg Oral TID Sharma Covert, MD   200 mg at 11/12/19 2058  . haloperidol lactate (HALDOL) injection 5 mg  5 mg Intramuscular Once PRN Sharma Covert, MD       And  . LORazepam (ATIVAN) injection 2 mg  2 mg Intravenous Once PRN Sharma Covert, MD       And  . diphenhydrAMINE (BENADRYL) injection 50 mg  50 mg Intramuscular Once PRN Sharma Covert, MD      . LORazepam (ATIVAN) tablet 2 mg  2 mg Oral Q6H PRN Johnn Hai, MD       Or  . LORazepam (ATIVAN) injection 2 mg  2 mg Intramuscular Q6H PRN Johnn Hai, MD      . risperiDONE (RISPERDAL) tablet 3 mg  3 mg Oral BID Johnn Hai, MD   3 mg at 11/12/19 2057  . temazepam (RESTORIL) capsule 30 mg  30 mg Oral QHS Johnn Hai, MD   30 mg at 11/12/19 2057  . traZODone (DESYREL) tablet 200 mg  200 mg Oral QHS PRN Johnn Hai, MD       PTA Medications: Medications Prior to Admission  Medication Sig Dispense Refill Last Dose  . benztropine (COGENTIN) 1 MG tablet Take 1 tablet (1 mg total) by mouth 2 (two) times daily. (Patient not taking: Reported on 11/12/2019) 60 tablet 2 Not Taking at Unknown time  . carbamazepine (TEGRETOL) 100 MG chewable tablet Chew 1 tablet (100 mg total) by mouth 3 (three) times daily. (Patient not taking: Reported on 11/12/2019) 90 tablet 2 Not Taking at Unknown time   . paliperidone (INVEGA SUSTENNA) 234 MG/1.5ML SUSY injection Inject 234 mg into the muscle once for 1 dose. Due 5/30 1.5 mL 11   . risperiDONE (RISPERDAL) 3 MG tablet Take 2 tablets (6 mg total) by mouth at bedtime. (Patient not taking: Reported on 11/12/2019) 60 tablet 2 Not Taking at Unknown time  . traZODone (DESYREL) 100 MG tablet Take 2 tablets (200 mg total) by mouth at bedtime as needed for sleep. (Patient not taking: Reported on 11/12/2019) 60 tablet 2 Not Taking at Unknown time    Patient Stressors: Medication change or noncompliance Substance abuse  Patient Strengths: Supportive family/friends  Treatment Modalities: Medication Management, Group therapy, Case management,  1 to 1 session with clinician, Psychoeducation, Recreational therapy.   Physician Treatment Plan for Primary Diagnosis: <principal problem not specified> Long Term Goal(s): Improvement in symptoms so as ready for discharge Improvement in symptoms so as ready for discharge   Short Term Goals: Ability to disclose and discuss suicidal ideas Ability to demonstrate self-control will improve Ability to identify and develop effective coping behaviors will improve Ability to identify changes in lifestyle to reduce recurrence of condition will improve Ability to verbalize feelings will improve Ability to disclose and discuss suicidal ideas Ability to demonstrate self-control will improve Ability  to maintain clinical measurements within normal limits will improve  Medication Management: Evaluate patient's response, side effects, and tolerance of medication regimen.  Therapeutic Interventions: 1 to 1 sessions, Unit Group sessions and Medication administration.  Evaluation of Outcomes: Progressing  Physician Treatment Plan for Secondary Diagnosis: Active Problems:   Schizophrenia ([facility])  Long Term Goal(s): Improvement in symptoms so as ready for discharge Improvement in symptoms so as ready for discharge   Short  Term Goals: Ability to disclose and discuss suicidal ideas Ability to demonstrate self-control will improve Ability to identify and develop effective coping behaviors will improve Ability to identify changes in lifestyle to reduce recurrence of condition will improve Ability to verbalize feelings will improve Ability to disclose and discuss suicidal ideas Ability to demonstrate self-control will improve Ability to maintain clinical measurements within normal limits will improve     Medication Management: Evaluate patient's response, side effects, and tolerance of medication regimen.  Therapeutic Interventions: 1 to 1 sessions, Unit Group sessions and Medication administration.  Evaluation of Outcomes: Progressing   RN Treatment Plan for Primary Diagnosis: <principal problem not specified> Long Term Goal(s): Knowledge of disease and therapeutic regimen to maintain health will improve  Short Term Goals: Ability to verbalize frustration and anger appropriately will improve, Ability to demonstrate self-control and Compliance with prescribed medications will improve  Medication Management: RN will administer medications as ordered by provider, will assess and evaluate patient's response and provide education to patient for prescribed medication. RN will report any adverse and/or side effects to prescribing provider.  Therapeutic Interventions: 1 on 1 counseling sessions, Psychoeducation, Medication administration, Evaluate responses to treatment, Monitor vital signs and CBGs as ordered, Perform/monitor CIWA, COWS, AIMS and Fall Risk screenings as ordered, Perform wound care treatments as ordered.  Evaluation of Outcomes: Progressing   LCSW Treatment Plan for Primary Diagnosis: <principal problem not specified> Long Term Goal(s): Safe transition to appropriate next level of care at discharge, Engage patient in therapeutic group addressing interpersonal concerns.  Short Term Goals: Engage  patient in aftercare planning with referrals and resources and Increase skills for wellness and recovery  Therapeutic Interventions: Assess for all discharge needs, 1 to 1 time with Social worker, Explore available resources and support systems, Assess for adequacy in community support network, Educate family and significant other(s) on suicide prevention, Complete Psychosocial Assessment, Interpersonal group therapy.  Evaluation of Outcomes: Progressing   Progress in Treatment: Attending groups: Yes. Participating in groups: Yes. Taking medication as prescribed: Yes. Toleration medication: Yes. Family/Significant other contact made: No, will contact:  when consent given Patient understands diagnosis: No. Discussing patient identified problems/goals with staff: Yes. Medical problems stabilized or resolved: Yes. Denies suicidal/homicidal ideation: Yes. Issues/concerns per patient self-inventory: No. Other: N/A  New problem(s) identified: No, Describe:  none  New Short Term/Long Term Goal(s): Detox, elimination of AVH/symptoms of psychosis, medication management for mood stabilization; elimination of SI thoughts; development of comprehensive mental wellness/sobriety plan.   Patient Goals:  "Getting my anger to an okay level"  Discharge Plan or Barriers: SPE pamphlet, Mobile Crisis information, and AA/NA information provided to patient for additional community support and resources. Pt declines referral for follow up treatment.   Reason for Continuation of Hospitalization: Delusions  Hallucinations Medication stabilization  Estimated Length of Stay: 3-5 days  Attendees: Patient: Lawrence [name] 11/13/2019 11:59 AM  Physician: Dr Jama Flavors MD 11/13/2019 11:59 AM  Nursing:  11/13/2019 11:59 AM  RN Care Manager: 11/13/2019 11:59 AM  Social Worker: Alvan Culpepper LCSW 11/13/2019 11:59 AM  Recreational Therapist:  11/13/2019 11:59 AM  Other:  11/13/2019 11:59 AM  Other:  11/13/2019 11:59 AM   Other: 11/13/2019 11:59 AM    Scribe for Treatment Team: Charlann Lange Casidee Jann, LCSW 11/13/2019 11:59 AM

## 2019-11-13 NOTE — [facility] Suicide Risk Assessment (Signed)
[facility] INPATIENT:  Family/Significant Other Suicide Prevention Education  Suicide Prevention Education:  Patient Refusal for Family/Significant Other Suicide Prevention Education: The patient Lawrence [name] has refused to provide written consent for family/significant other to be provided Family/Significant Other Suicide Prevention Education during admission and/or prior to discharge.  Physician notified.   SPE completed with pt, as pt refused to consent to family contact. SPI pamphlet provided to pt and pt was encouraged to share information with support network, ask questions, and talk about any concerns relating to SPE. Pt denies access to guns/firearms and verbalized understanding of information provided. Mobile Crisis information also provided to pt.   Charlann Lange Willadene Mounsey MSW LCSW 11/13/2019, 2:04 PM

## 2019-11-13 NOTE — Progress Notes (Signed)
   11/13/19 1400  Psych Admission Type (Psych Patients Only)  Admission Status Involuntary  Psychosocial Assessment  Patient Complaints Anxiety  Eye Contact Brief;Fair  Facial Expression Anxious  Affect Preoccupied;Labile  Speech Pressured  Interaction Defensive;Minimal  Motor Activity Other (Comment) (WNL)  Appearance/Hygiene Body odor;Disheveled;Layered clothes;Poor hygiene  Behavior Characteristics Cooperative  Mood Anxious;Preoccupied  Thought Process  Coherency Tangential;Loose associations  Content Preoccupation;Paranoia;Delusions  Delusions Grandeur;Paranoid  Perception Derealization  Hallucination Auditory  Judgment Impaired  Confusion WDL  Danger to Self  Current suicidal ideation? Denies  Danger to Others  Danger to Others None reported or observed

## 2019-11-13 NOTE — Progress Notes (Signed)
   11/13/19 0003  Charting Type  Charting Type Shift assessment  Orders Chart Check (once per shift) Completed  Safety Check Verification  Has the RN verified the 15 minute safety check completion? Yes  Neurological  Neuro (WDL) WDL  HEENT  HEENT (WDL) WDL  Respiratory  Respiratory (WDL) WDL  Vascular  Vascular (WDL) WDL  Integumentary  Integumentary (WDL) WDL (dry skin heals bilat)  Braden Scale (Ages 8 and up)  Sensory Perceptions 4  Moisture 4  Activity 4  Mobility 4  Nutrition 3  Friction and Shear 3  Braden Scale Score 22  Musculoskeletal  Musculoskeletal (WDL) WDL  Gastrointestinal  Gastrointestinal (WDL) WDL  GU Assessment  Genitourinary (WDL) WDL  D: Patient in dayroom interacting with peers. Pt pacing back and forth asking how long medication given earlier will last in his body.  A: Medications administered as prescribed. Support and encouragement provided as needed.  R: Patient remains safe on the unit. Will continue to monitor for safety and stability.

## 2019-11-13 NOTE — Progress Notes (Signed)
Recreation Therapy Notes  Patient admitted to unit 3.25.21. Due to admission within last year, no new assessment conducted at this time. Last assessment conducted 4.28.20. Patient reports not having any stressors at this time.  Patient reports reason for admission as "I was doing a religious fast and my mother called the doctor concerned about me".  Patient expressed he hadn't done anything to admitted to be admitted.  Patient expressed coping skills continue to be isolation, journal, tv, sports, arguments, aggression, music, exercise, meditate, deep breathing, substance abuse, impulsivity, talk, prayer, art, avoidance, read, hot bath/shower.  Patient expressed leisure interests were still video games, reading and learn things.  Patient identified no areas of improvement.  Patient identified strengths as intelligent and nice.   Patient denies SI, HI, AVH at this time. Patient reports goal to control anger.    Caroll Rancher, LRT/CTRS   Caroll Rancher A 11/13/2019 11:17 AM

## 2019-11-14 LAB — CARBAMAZEPINE LEVEL, TOTAL: Carbamazepine Lvl: 8.1 ug/mL (ref 4.0–12.0)

## 2019-11-14 LAB — BASIC METABOLIC PANEL
Anion gap: 9 (ref 5–15)
BUN: 10 mg/dL (ref 6–20)
CO2: 28 mmol/L (ref 22–32)
Calcium: 8.8 mg/dL — ABNORMAL LOW (ref 8.9–10.3)
Chloride: 105 mmol/L (ref 98–111)
Creatinine, Ser: 1.06 mg/dL (ref 0.61–1.24)
GFR calc Af Amer: >60 mL/min (ref >60)
GFR calc non Af Amer: >60 mL/min (ref >60)
Glucose, Bld: 93 mg/dL (ref 70–99)
Potassium: 3.6 mmol/L (ref 3.5–5.1)
Sodium: 142 mmol/L (ref 135–145)

## 2019-11-14 LAB — URINALYSIS, ROUTINE W REFLEX MICROSCOPIC
Bilirubin Urine: NEGATIVE
Glucose, UA: NEGATIVE mg/dL
Hgb urine dipstick: NEGATIVE
Ketones, ur: NEGATIVE mg/dL
Leukocytes,Ua: NEGATIVE
Nitrite: NEGATIVE
Protein, ur: NEGATIVE mg/dL
Specific Gravity, Urine: 1.021 (ref 1.005–1.030)
pH: 7 (ref 5.0–8.0)

## 2019-11-14 LAB — RAPID URINE DRUG SCREEN, HOSP PERFORMED
Amphetamines: NOT DETECTED
Barbiturates: NOT DETECTED
Benzodiazepines: POSITIVE — AB
Cocaine: NOT DETECTED
Opiates: NOT DETECTED
Tetrahydrocannabinol: NOT DETECTED

## 2019-11-14 LAB — LIPID PANEL
Cholesterol: 141 mg/dL (ref 0–200)
HDL: 42 mg/dL (ref >40)
LDL Cholesterol: 89 mg/dL (ref 0–99)
Total CHOL/HDL Ratio: 3.4 RATIO
Triglycerides: 52 mg/dL (ref <150)
VLDL: 10 mg/dL (ref 0–40)

## 2019-11-14 LAB — HEMOGLOBIN A1C
Hgb A1c MFr Bld: 5.6 % (ref 4.8–5.6)
Mean Plasma Glucose: 114.02 mg/dL

## 2019-11-14 LAB — TSH: TSH: 2.726 u[IU]/mL (ref 0.350–4.500)

## 2019-11-14 LAB — PROLACTIN: Prolactin: 35.6 ng/mL — ABNORMAL HIGH (ref 4.0–15.2)

## 2019-11-14 NOTE — Progress Notes (Signed)
   11/14/19 2015  COVID-19 Daily Checkoff  Have you had a fever (temp > 37.80C/100F)  in the past 24 hours?  No  If you have had runny nose, nasal congestion, sneezing in the past 24 hours, has it worsened? No  COVID-19 EXPOSURE  Have you traveled outside the state in the past 14 days? No  Have you been in contact with someone with a confirmed diagnosis of COVID-19 or PUI in the past 14 days without wearing appropriate PPE? No  Have you been living in the same home as a person with confirmed diagnosis of COVID-19 or a PUI (household contact)? No  Have you been diagnosed with COVID-19? No

## 2019-11-14 NOTE — Progress Notes (Signed)
   11/14/19 0800  Psych Admission Type (Psych Patients Only)  Admission Status Involuntary  Psychosocial Assessment  Patient Complaints Anxiety  Eye Contact Brief  Facial Expression Flat  Affect Preoccupied;Labile  Speech Soft;Slow;Tangential  Interaction Defensive;Minimal  Motor Activity Slow  Appearance/Hygiene Body odor;Disheveled;Layered clothes;Poor hygiene  Behavior Characteristics Cooperative  Mood Suspicious;Anxious  Thought Process  Coherency Tangential;Loose associations  Content Preoccupation;Paranoia;Delusions  Delusions Grandeur;Paranoid  Perception Derealization  Hallucination Auditory  Judgment Impaired  Confusion Mild  Danger to Self  Current suicidal ideation? Denies  Danger to Others  Danger to Others None reported or observed

## 2019-11-14 NOTE — [facility] Group Notes (Signed)
  [facility] LCSW Group Therapy Note  Date/Time:  11/14/2019 11:15AM-12:00PM  Type of Therapy and Topic:  Group Therapy:  Feelings About Hospitalization  Participation Level:  Active   Description of Group This process group involved patients discussing their feelings related to being hospitalized, as well as the benefits they see to being in the hospital.  These feelings and benefits were itemized.  The group then brainstormed specific ways in which they could seek those same benefits when they discharge and return home.  Therapeutic Goals Patient will identify and describe positive and negative feelings related to hospitalization Patient will verbalize benefits of hospitalization to themselves personally Patients will brainstorm together ways they can obtain similar benefits in the outpatient setting, identify barriers to wellness and possible solutions  Summary of Patient Progress:  The patient expressed his primary feelings about being hospitalized are that it is "the perfect space to figure out how to interact with people without judgment."  He expressed confidence in the treatment he is receiving, but also questioned the IVC process.  He was attentive and interactive throughout group  Therapeutic Modalities Cognitive Behavioral Therapy Motivational Interviewing    Ambrose Mantle, LCSW 11/14/2019, 9:29 AM

## 2019-11-14 NOTE — Progress Notes (Signed)
   11/14/19 0000  Psych Admission Type (Psych Patients Only)  Admission Status Involuntary  Psychosocial Assessment  Patient Complaints Anxiety  Eye Contact Brief;Fair  Facial Expression Anxious  Affect Preoccupied;Labile  Speech Pressured  Interaction Defensive;Minimal  Motor Activity Other (Comment) (WNL)  Appearance/Hygiene Body odor;Disheveled;Layered clothes;Poor hygiene  Behavior Characteristics Cooperative  Mood Anxious  Thought Process  Coherency Tangential;Loose associations  Content Preoccupation;Paranoia;Delusions  Delusions Grandeur;Paranoid  Perception Derealization  Hallucination Auditory  Judgment Impaired  Confusion WDL  Danger to Self  Current suicidal ideation? Denies  Danger to Others  Danger to Others None reported or observed   Pt continues to be suspicious and wanting to leave, but pt appeared to listen to writer when explaining information important to his Tx.

## 2019-11-14 NOTE — Progress Notes (Signed)
[facility]  11/14/2019 12:36 PM Lawrence MinusCameron Cassedy  MRN:  [id number] Subjective: Patient is a 24 year old male with a past psychiatric history significant for schizoaffective disorder; bipolar type versus schizophrenia who was admitted on 11/12/2019 secondary to worsening symptoms, delusional thinking and noncompliance with medication.  Objective: Patient is seen and examined.  Patient is a 24 year old male with the above-stated past psychiatric history who is seen in follow-up.  He of the nursing notes reflected that he reported having an overall day of 5 out of 10.  Stated he was doing well.  He stated he felt like he had a little bit of slurred speech, but that did not notice that today.  He stated he had a more positive outlook towards things.  His hygiene is not good, and he definitely has body odor.  He denied any auditory or visual hallucinations.  He denied any suicidal or homicidal ideation.  His vital signs are stable, he is afebrile.  He slept 6.5 hours last night.  Review of his laboratories on admission showed a mildly elevated AST at 51, but otherwise normal electrolytes.  Lipid panel was normal and CBC was normal as well.  His Tegretol level on 3/27 was 8.1.  TSH was normal at 2.726.  Blood alcohol on admission was less than 10.  Principal Problem: <principal problem not specified> Diagnosis: Active Problems:   Schizophrenia (HCC)  Total Time spent with patient: 15 minutes  Past Psychiatric History: Admission H&P  Past Medical History:  Past Medical History:  Diagnosis Date  . ADD (attention deficit disorder)   . Asthma   . Depressed   . Schizoaffective disorder (HCC)    History reviewed. No pertinent surgical history. Family History: History reviewed. No pertinent family history. Family Psychiatric  History: See admission H&P Social History:  Social History   Substance and Sexual Activity  Alcohol Use Not Currently     Social History   Substance and Sexual  Activity  Drug Use Yes  . Frequency: 2.0 times per week  . Types: Marijuana   Comment: UTA recent use    Social History   Socioeconomic History  . Marital status: Single    Spouse name: Not on file  . Number of children: Not on file  . Years of education: Not on file  . Highest education level: Not on file  Occupational History  . Occupation: Unemployed  Tobacco Use  . Smoking status: Current Some Day Smoker    Years: 0.00    Types: Cigarettes  . Smokeless tobacco: Never Used  Substance and Sexual Activity  . Alcohol use: Not Currently  . Drug use: Yes    Frequency: 2.0 times per week    Types: Marijuana    Comment: UTA recent use  . Sexual activity: Never  Other Topics Concern  . Not on file  Social History Narrative   Pt is unemployed; lives with grandmother   Social Determinants of Health   Financial Resource Strain:   . Difficulty of Paying Living Expenses:   Food Insecurity:   . Worried About Programme researcher, broadcasting/film/videounning Out of Food in the Last Year:   . Baristaan Out of Food in the Last Year:   Transportation Needs:   . Freight forwarderLack of Transportation (Medical):   Marland Kitchen. Lack of Transportation (Non-Medical):   Physical Activity:   . Days of Exercise per Week:   . Minutes of Exercise per Session:   Stress:   . Feeling of Stress :   Social Connections:   .  Frequency of Communication with Friends and Family:   . Frequency of Social Gatherings with Friends and Family:   . Attends Religious Services:   . Active Member of Clubs or Organizations:   . Attends Archivist Meetings:   Marland Kitchen Marital Status:    Additional Social History:    Pain Medications: See MAR Prescriptions: See MAR Over the Counter: See MAR History of alcohol / drug use?: Yes Longest period of sobriety (when/how long): UNKNOWN Name of Substance 1: marijuana 1 - Age of First Use: UTA 1 - Amount (size/oz): UTA 1 - Frequency: UTA 1 - Duration: UTA 1 - Last Use / Amount: UTA                  Sleep:  Good  Appetite:  Good  Current Medications: Current Facility-Administered Medications  Medication Dose Route Frequency Provider Last Rate Last Admin  . alum & mag hydroxide-simeth (MAALOX/MYLANTA) 200-200-20 MG/5ML suspension 30 mL  30 mL Oral Q4H PRN Rankin, Shuvon B, NP   30 mL at 11/13/19 1642  . benztropine (COGENTIN) tablet 1 mg  1 mg Oral BID Johnn Hai, MD   1 mg at 11/14/19 1003  . carbamazepine (TEGRETOL) chewable tablet 200 mg  200 mg Oral TID Sharma Covert, MD   200 mg at 11/14/19 1003  . haloperidol lactate (HALDOL) injection 5 mg  5 mg Intramuscular Once PRN Sharma Covert, MD       And  . LORazepam (ATIVAN) injection 2 mg  2 mg Intravenous Once PRN Sharma Covert, MD       And  . diphenhydrAMINE (BENADRYL) injection 50 mg  50 mg Intramuscular Once PRN Sharma Covert, MD      . LORazepam (ATIVAN) tablet 2 mg  2 mg Oral Q6H PRN Johnn Hai, MD       Or  . LORazepam (ATIVAN) injection 2 mg  2 mg Intramuscular Q6H PRN Johnn Hai, MD      . risperiDONE (RISPERDAL) tablet 3 mg  3 mg Oral BID Johnn Hai, MD   3 mg at 11/14/19 1003  . temazepam (RESTORIL) capsule 15 mg  15 mg Oral QHS Cobos, Myer Peer, MD   15 mg at 11/13/19 2059  . traZODone (DESYREL) tablet 100 mg  100 mg Oral QHS PRN Cobos, Myer Peer, MD        Lab Results:  Results for orders placed or performed during the hospital encounter of 11/11/19 (from the past 48 hour(s))  CBC     Status: None   Collection Time: 11/12/19  6:33 PM  Result Value Ref Range   WBC 7.9 4.0 - 10.5 K/uL   RBC 5.68 4.22 - 5.81 MIL/uL   Hemoglobin 16.8 13.0 - 17.0 g/dL   HCT 50.9 39.0 - 52.0 %   MCV 89.6 80.0 - 100.0 fL   MCH 29.6 26.0 - 34.0 pg   MCHC 33.0 30.0 - 36.0 g/dL   RDW 12.3 11.5 - 15.5 %   Platelets 213 150 - 400 K/uL   nRBC 0.0 0.0 - 0.2 %    Comment: Performed at [facility], [place] [address] [place],  [address]  Comprehensive metabolic panel     Status: Abnormal    Collection Time: 11/12/19  6:33 PM  Result Value Ref Range   Sodium 141 135 - 145 mmol/L   Potassium 3.5 3.5 - 5.1 mmol/L   Chloride 103 98 - 111 mmol/L   CO2 27  22 - 32 mmol/L   Glucose, Bld 108 (H) 70 - 99 mg/dL    Comment: Glucose reference range applies only to samples taken after fasting for at least 8 hours.   BUN 22 (H) 6 - 20 mg/dL   Creatinine, Ser 1.28 (H) 0.61 - 1.24 mg/dL   Calcium 9.2 8.9 - 78.6 mg/dL   Total Protein 7.6 6.5 - 8.1 g/dL   Albumin 4.6 3.5 - 5.0 g/dL   AST 51 (H) 15 - 41 U/L   ALT 24 0 - 44 U/L   Alkaline Phosphatase 69 38 - 126 U/L   Total Bilirubin 1.2 0.3 - 1.2 mg/dL   GFR calc non Af Amer >60 >60 mL/min   GFR calc Af Amer >60 >60 mL/min   Anion gap 11 5 - 15    Comment: Performed at [facility], [address], [place], [place] [address]  Ethanol     Status: None   Collection Time: 11/12/19  6:33 PM  Result Value Ref Range   Alcohol, Ethyl (B) <10 <10 mg/dL    Comment: (NOTE) Lowest detectable limit for serum alcohol is 10 mg/dL. For medical purposes only. Performed at [facility], [address] [place], [place] [address]   Prolactin     Status: Abnormal   Collection Time: 11/12/19  6:33 PM  Result Value Ref Range   Prolactin 35.6 (H) 4.0 - 15.2 ng/mL    Comment: (NOTE) Performed At: [facility] [address] [place], [place] [id number] Jolene Schimke MD [id number]   Carbamazepine level, total     Status: None   Collection Time: 11/14/19  6:53 AM  Result Value Ref Range   Carbamazepine Lvl 8.1 4.0 - 12.0 ug/mL    Comment: Performed at [facility], [address], [place], [place] [address]  Hemoglobin A1c     Status: None   Collection Time: 11/14/19  6:53 AM  Result Value Ref Range   Hgb A1c MFr Bld 5.6 4.8 - 5.6 %    Comment: (NOTE) Pre diabetes:          5.7%-6.4% Diabetes:              >6.4% Glycemic control for   <7.0% adults with diabetes    Mean Plasma Glucose  114.02 mg/dL    Comment: Performed at [facility], [address], [place], [place] [address]  Lipid panel     Status: None   Collection Time: 11/14/19  6:53 AM  Result Value Ref Range   Cholesterol 141 0 - 200 mg/dL   Triglycerides 52 <174 mg/dL   HDL 42 >94 mg/dL   Total CHOL/HDL Ratio 3.4 RATIO   VLDL 10 0 - 40 mg/dL   LDL Cholesterol 89 0 - 99 mg/dL    Comment:        Total Cholesterol/HDL:CHD Risk Coronary Heart Disease Risk Table                     Men   Women  1/2 Average Risk   3.4   3.3  Average Risk       5.0   4.4  2 X Average Risk   9.6   7.1  3 X Average Risk  23.4   11.0        Use the calculated Patient Ratio above and the CHD Risk Table to determine the patient's CHD Risk.  ATP III CLASSIFICATION (LDL):  <100     mg/dL   Optimal  326-712  mg/dL   Near or Above                    Optimal  130-159  mg/dL   Borderline  458-099  mg/dL   High  >833     mg/dL   Very High Performed at [facility], [address], [place], [place] [address]   TSH     Status: None   Collection Time: 11/14/19  6:53 AM  Result Value Ref Range   TSH 2.726 0.350 - 4.500 uIU/mL    Comment: Performed by a 3rd Generation assay with a functional sensitivity of <=0.01 uIU/mL. Performed at [facility], [address] [place], [place] [address]   Basic metabolic panel     Status: Abnormal   Collection Time: 11/14/19  6:53 AM  Result Value Ref Range   Sodium 142 135 - 145 mmol/L   Potassium 3.6 3.5 - 5.1 mmol/L   Chloride 105 98 - 111 mmol/L   CO2 28 22 - 32 mmol/L   Glucose, Bld 93 70 - 99 mg/dL    Comment: Glucose reference range applies only to samples taken after fasting for at least 8 hours.   BUN 10 6 - 20 mg/dL   Creatinine, Ser 3.41 0.61 - 1.24 mg/dL   Calcium 8.8 (L) 8.9 - 10.3 mg/dL   GFR calc non Af Amer >60 >60 mL/min   GFR calc Af Amer >60 >60 mL/min   Anion gap 9 5 - 15    Comment: Performed at [facility], [address] [place], [place] [address]    Blood Alcohol level:  Lab Results  Component Value Date   ETH <10 11/12/2019   ETH <10 09/20/2017    Metabolic Disorder Labs: Lab Results  Component Value Date   HGBA1C 5.6 11/14/2019   MPG 114.02 11/14/2019   MPG 114.02 12/14/2018   Lab Results  Component Value Date   PROLACTIN 35.6 (H) 11/12/2019   Lab Results  Component Value Date   CHOL 141 11/14/2019   TRIG 52 11/14/2019   HDL 42 11/14/2019   CHOLHDL 3.4 11/14/2019   VLDL 10 11/14/2019   LDLCALC 89 11/14/2019   LDLCALC 50 12/14/2018    Physical Findings: AIMS: Facial and Oral Movements Muscles of Facial Expression: None, normal Lips and Perioral Area: None, normal Jaw: None, normal Tongue: None, normal, ,  ,  ,    CIWA:  CIWA-Ar Total: 0 COWS:     Musculoskeletal: Strength & Muscle Tone: within normal limits Gait & Station: normal Patient leans: N/A  Psychiatric Specialty Exam: Physical Exam  Nursing note and vitals reviewed. Constitutional: He is oriented to person, place, and time. He appears well-developed and well-nourished.  HENT:  Head: Normocephalic and atraumatic.  Respiratory: Effort normal.  Neurological: He is alert and oriented to person, place, and time.    Review of Systems  Blood pressure 115/90, pulse 95, temperature 98 F (36.7 C), temperature source Oral, resp. rate 16, height 6\' 1"  (1.854 m), weight 83.9 kg, SpO2 100 %.Body mass index is 24.41 kg/m.  General Appearance: Disheveled  Eye Contact:  Fair  Speech:  Normal Rate  Volume:  Decreased  Mood:  Euthymic  Affect:  Congruent  Thought Process:  Coherent and Descriptions of Associations: Intact  Orientation:  Full (Time, Place, and Person)  Thought Content:  Logical  Suicidal Thoughts:  No  Homicidal Thoughts:  No  Memory:  Immediate;   Fair Recent;   Fair Remote;   Fair  Judgement:  Intact  Insight:  Fair  Psychomotor Activity:  Normal  Concentration:   Concentration: Fair and Attention Span: Fair  Recall:  [employer] of Knowledge:  Fair  Language:  Fair  Akathisia:  Negative  Handed:  Right  AIMS (if indicated):     Assets:  Desire for Improvement Resilience  ADL's:  Impaired  Cognition:  WNL  Sleep:  Number of Hours: 6.5     Treatment Plan Summary: Daily contact with patient to assess and evaluate symptoms and progress in treatment, Medication management and Plan : Patient is seen and examined.  Patient is a 24 year old male with the above-stated past psychiatric history who is seen in follow-up.   Diagnosis: #1 schizoaffective disorder; bipolar type versus schizophrenia  Patient is seen in follow-up.  He seems to be doing well.  He continues to improve on his current medications.  No change in his medications today.  His Tegretol level is good, and the rest of his labs were normal.  I will order an EKG today given the antipsychotic medication.  No change in his current medications.  #1 continue Cogentin 1 mg p.o. twice daily for side effects of medication. #2 continue Tegretol 200 mg p.o. 3 times daily for mood stability. #3 continue agitation protocol as needed if necessary. #4 continue Risperdal 3 mg p.o. twice daily for psychosis. #5 continue to lorazepam 15 mg p.o. nightly for insomnia. #6 continue trazodone 100 mg p.o. nightly as needed insomnia. #7 disposition planning-in progress.  Antonieta Pert, MD 11/14/2019, 12:36 PM

## 2019-11-14 NOTE — Progress Notes (Signed)
   11/14/19 2015  Psych Admission Type (Psych Patients Only)  Admission Status Involuntary  Psychosocial Assessment  Patient Complaints None  Eye Contact Brief  Facial Expression Flat  Affect Preoccupied;Labile  Speech Soft;Slow;Tangential  Interaction Defensive;Minimal  Motor Activity Slow  Appearance/Hygiene Body odor;Disheveled;Layered clothes;Poor hygiene  Behavior Characteristics Cooperative  Mood Preoccupied  Thought Process  Coherency Tangential;Loose associations  Content Preoccupation;Paranoia;Delusions  Delusions Grandeur;Paranoid  Perception Derealization  Hallucination Auditory  Judgment Impaired  Confusion Mild  Danger to Self  Current suicidal ideation? Denies  Danger to Others  Danger to Others None reported or observed

## 2019-11-15 NOTE — Progress Notes (Signed)
Patient has been up in the dayroom talking with Cala Bradford and watching tv.He   11/15/19 1930  Psych Admission Type (Psych Patients Only)  Admission Status Involuntary  Psychosocial Assessment  Patient Complaints None  Eye Contact Brief  Facial Expression Flat  Affect Preoccupied  Speech Soft;Slow;Tangential  Interaction Minimal  Motor Activity Slow  Appearance/Hygiene Disheveled  Behavior Characteristics Cooperative;Appropriate to situation  Mood Preoccupied  Aggressive Behavior  Effect No apparent injury  Thought Process  Coherency Tangential;Loose associations  Content Preoccupation;Paranoia;Delusions  Delusions Grandeur;Paranoid  Perception Derealization  Hallucination Auditory  Judgment Impaired  Confusion Mild  Danger to Self  Current suicidal ideation? Denies  Danger to Others  Danger to Others None reported or observed   has voiced no complaints. Patient currently denies having pain, -si/hi/a/v hall. Support and encouragement offered, safety maintained on unit, will continue to monitor.

## 2019-11-15 NOTE — [facility] Group Notes (Signed)
[facility] LCSW Group Therapy Note  Date/Time:  11/15/2019  11:00AM-12:00PM  Type of Therapy and Topic:  Group Therapy:  Music and Mood  Participation Level:  Active   Description of Group: In this process group, members listened to a variety of genres of music and identified that different types of music evoke different responses.  Patients were encouraged to identify music that was soothing for them and music that was energizing for them.  Patients discussed how this knowledge can help with wellness and recovery in various ways including managing depression and anxiety as well as encouraging healthy sleep habits.    Therapeutic Goals: Patients will explore the impact of different varieties of music on mood Patients will verbalize the thoughts they have when listening to different types of music Patients will identify music that is soothing to them as well as music that is energizing to them Patients will discuss how to use this knowledge to assist in maintaining wellness and recovery Patients will explore the use of music as a coping skill  Summary of Patient Progress:  At the beginning of group, patient expressed that he felt really good and that he was accomplishing his goal for today by focus on just today.  Throughout group the patient wanted to analyze various songs.  At one point, he talked negatively about people who liked a song that made him angry, and CSW had to intervene and stop him from proceeding further.  At the end of group, patient expressed that he still felt really good..    Therapeutic Modalities: Solution Focused Brief Therapy Activity   Ambrose Mantle, LCSW

## 2019-11-15 NOTE — Progress Notes (Signed)
[facility] MD Progress Note  11/15/2019 11:25 AM Lawrence [name]  MRN:  [id number] Subjective:  Patient is a 24 year old male with a past psychiatric history significant for schizoaffective disorder; bipolar type versus schizophrenia who was admitted on 11/12/2019 secondary to worsening symptoms, delusional thinking and noncompliance with medication.  Objective: Patient is seen and examined.  Patient is a 24 year old male with the above-stated past psychiatric history who is seen in follow-up.  He is doing better today.  He is able to smile and engage.  He has been compliant with his medications.  He denied any auditory or visual hallucinations.  His vital signs are stable, he is afebrile.  He slept 6.25 hours last night.  We discussed his options for discharge, and already talking on the telephone, and he got irritated with some of his family members, but overall was appropriate and did not get overly angry, hostile or threatening.  His electrolytes from 3/27 were all essentially normal.  His blood sugar on 3/27 was 93, and his hemoglobin A1c is 5.6.  His EKG showed a normal sinus rhythm with some right ventricular hypertrophy, but his QTC was 445 in the normal range.  Principal Problem: <principal problem not specified> Diagnosis: Active Problems:   Schizophrenia (HCC)  Total Time spent with patient: 20 minutes  Past Psychiatric History: Admission H&P  Past Medical History:  Past Medical History:  Diagnosis Date  . ADD (attention deficit disorder)   . Asthma   . Depressed   . Schizoaffective disorder (HCC)    History reviewed. No pertinent surgical history. Family History: History reviewed. No pertinent family history. Family Psychiatric  History: See admission H&P Social History:  Social History   Substance and Sexual Activity  Alcohol Use Not Currently     Social History   Substance and Sexual Activity  Drug Use Yes  . Frequency: 2.0 times per week  . Types: Marijuana   Comment: UTA  recent use    Social History   Socioeconomic History  . Marital status: Single    Spouse name: Not on file  . Number of children: Not on file  . Years of education: Not on file  . Highest education level: Not on file  Occupational History  . Occupation: Unemployed  Tobacco Use  . Smoking status: Current Some Day Smoker    Years: 0.00    Types: Cigarettes  . Smokeless tobacco: Never Used  Substance and Sexual Activity  . Alcohol use: Not Currently  . Drug use: Yes    Frequency: 2.0 times per week    Types: Marijuana    Comment: UTA recent use  . Sexual activity: Never  Other Topics Concern  . Not on file  Social History Narrative   Pt is unemployed; lives with grandmother   Social Determinants of Health   Financial Resource Strain:   . Difficulty of Paying Living Expenses:   Food Insecurity:   . Worried About Programme researcher, broadcasting/film/video in the Last Year:   . Barista in the Last Year:   Transportation Needs:   . Freight forwarder (Medical):   Marland Kitchen Lack of Transportation (Non-Medical):   Physical Activity:   . Days of Exercise per Week:   . Minutes of Exercise per Session:   Stress:   . Feeling of Stress :   Social Connections:   . Frequency of Communication with Friends and Family:   . Frequency of Social Gatherings with Friends and Family:   . Attends Religious  Services:   . Active Member of Clubs or Organizations:   . Attends Banker Meetings:   Marland Kitchen Marital Status:    Additional Social History:    Pain Medications: See MAR Prescriptions: See MAR Over the Counter: See MAR History of alcohol / drug use?: Yes Longest period of sobriety (when/how long): UNKNOWN Name of Substance 1: marijuana 1 - Age of First Use: UTA 1 - Amount (size/oz): UTA 1 - Frequency: UTA 1 - Duration: UTA 1 - Last Use / Amount: UTA                  Sleep: Good  Appetite:  Good  Current Medications: Current Facility-Administered Medications  Medication  Dose Route Frequency Provider Last Rate Last Admin  . alum & mag hydroxide-simeth (MAALOX/MYLANTA) 200-200-20 MG/5ML suspension 30 mL  30 mL Oral Q4H PRN Rankin, Shuvon B, NP   30 mL at 11/13/19 1642  . benztropine (COGENTIN) tablet 1 mg  1 mg Oral BID Malvin Johns, MD   1 mg at 11/15/19 0930  . carbamazepine (TEGRETOL) chewable tablet 200 mg  200 mg Oral TID Antonieta Pert, MD   200 mg at 11/15/19 1610  . haloperidol lactate (HALDOL) injection 5 mg  5 mg Intramuscular Once PRN Antonieta Pert, MD       And  . LORazepam (ATIVAN) injection 2 mg  2 mg Intravenous Once PRN Antonieta Pert, MD       And  . diphenhydrAMINE (BENADRYL) injection 50 mg  50 mg Intramuscular Once PRN Antonieta Pert, MD      . LORazepam (ATIVAN) tablet 2 mg  2 mg Oral Q6H PRN Malvin Johns, MD       Or  . LORazepam (ATIVAN) injection 2 mg  2 mg Intramuscular Q6H PRN Malvin Johns, MD      . risperiDONE (RISPERDAL) tablet 3 mg  3 mg Oral BID Malvin Johns, MD   3 mg at 11/15/19 0930  . temazepam (RESTORIL) capsule 15 mg  15 mg Oral QHS Cobos, Rockey Situ, MD   15 mg at 11/14/19 2153  . traZODone (DESYREL) tablet 100 mg  100 mg Oral QHS PRN Cobos, Rockey Situ, MD        Lab Results:  Results for orders placed or performed during the hospital encounter of 11/11/19 (from the past 48 hour(s))  Carbamazepine level, total     Status: None   Collection Time: 11/14/19  6:53 AM  Result Value Ref Range   Carbamazepine Lvl 8.1 4.0 - 12.0 ug/mL    Comment: Performed at [facility], [address], [place], [place] [address]  Hemoglobin A1c     Status: None   Collection Time: 11/14/19  6:53 AM  Result Value Ref Range   Hgb A1c MFr Bld 5.6 4.8 - 5.6 %    Comment: (NOTE) Pre diabetes:          5.7%-6.4% Diabetes:              >6.4% Glycemic control for   <7.0% adults with diabetes    Mean Plasma Glucose 114.02 mg/dL    Comment: Performed at [facility], [address] [place], [place] [address]   Lipid panel     Status: None   Collection Time: 11/14/19  6:53 AM  Result Value Ref Range   Cholesterol 141 0 - 200 mg/dL   Triglycerides 52 <191 mg/dL   HDL 42 >47 mg/dL  Total CHOL/HDL Ratio 3.4 RATIO   VLDL 10 0 - 40 mg/dL   LDL Cholesterol 89 0 - 99 mg/dL    Comment:        Total Cholesterol/HDL:CHD Risk Coronary Heart Disease Risk Table                     Men   Women  1/2 Average Risk   3.4   3.3  Average Risk       5.0   4.4  2 X Average Risk   9.6   7.1  3 X Average Risk  23.4   11.0        Use the calculated Patient Ratio above and the CHD Risk Table to determine the patient's CHD Risk.        ATP III CLASSIFICATION (LDL):  <100     mg/dL   Optimal  696-789  mg/dL   Near or Above                    Optimal  130-159  mg/dL   Borderline  381-017  mg/dL   High  >510     mg/dL   Very High Performed at [facility], [address] [place], [place] [address]   TSH     Status: None   Collection Time: 11/14/19  6:53 AM  Result Value Ref Range   TSH 2.726 0.350 - 4.500 uIU/mL    Comment: Performed by a 3rd Generation assay with a functional sensitivity of <=0.01 uIU/mL. Performed at [facility], [address] [place], [place] [address]   Basic metabolic panel     Status: Abnormal   Collection Time: 11/14/19  6:53 AM  Result Value Ref Range   Sodium 142 135 - 145 mmol/L   Potassium 3.6 3.5 - 5.1 mmol/L   Chloride 105 98 - 111 mmol/L   CO2 28 22 - 32 mmol/L   Glucose, Bld 93 70 - 99 mg/dL    Comment: Glucose reference range applies only to samples taken after fasting for at least 8 hours.   BUN 10 6 - 20 mg/dL   Creatinine, Ser 2.35 0.61 - 1.24 mg/dL   Calcium 8.8 (L) 8.9 - 10.3 mg/dL   GFR calc non Af Amer >60 >60 mL/min   GFR calc Af Amer >60 >60 mL/min   Anion gap 9 5 - 15    Comment: Performed at [facility], [address], [place], [place] [address]    Blood Alcohol level:  Lab Results   Component Value Date   ETH <10 11/12/2019   ETH <10 09/20/2017    Metabolic Disorder Labs: Lab Results  Component Value Date   HGBA1C 5.6 11/14/2019   MPG 114.02 11/14/2019   MPG 114.02 12/14/2018   Lab Results  Component Value Date   PROLACTIN 35.6 (H) 11/12/2019   Lab Results  Component Value Date   CHOL 141 11/14/2019   TRIG 52 11/14/2019   HDL 42 11/14/2019   CHOLHDL 3.4 11/14/2019   VLDL 10 11/14/2019   LDLCALC 89 11/14/2019   LDLCALC 50 12/14/2018    Physical Findings: AIMS: Facial and Oral Movements Muscles of Facial Expression: None, normal Lips and Perioral Area: None, normal Jaw: None, normal Tongue: None, normal, ,  ,  ,    CIWA:  CIWA-Ar Total: 0 COWS:     Musculoskeletal: Strength & Muscle Tone: within normal limits Gait & Station:  normal Patient leans: N/A  Psychiatric Specialty Exam: Physical Exam  Nursing note and vitals reviewed. Constitutional: He is oriented to person, place, and time. He appears well-developed and well-nourished.  HENT:  Head: Normocephalic and atraumatic.  Respiratory: Effort normal.  Neurological: He is alert and oriented to person, place, and time.    Review of Systems  Blood pressure 140/80, pulse (!) 101, temperature 98 F (36.7 C), temperature source Oral, resp. rate 16, height 6\' 1"  (1.854 m), weight 83.9 kg, SpO2 100 %.Body mass index is 24.41 kg/m.  General Appearance: Casual  Eye Contact:  Fair  Speech:  Normal Rate  Volume:  Normal  Mood:  Euthymic  Affect:  Congruent  Thought Process:  Coherent and Descriptions of Associations: Circumstantial  Orientation:  Full (Time, Place, and Person)  Thought Content:  Logical  Suicidal Thoughts:  No  Homicidal Thoughts:  No  Memory:  Immediate;   Fair Recent;   Fair Remote;   Fair  Judgement:  Intact  Insight:  Fair  Psychomotor Activity:  Normal  Concentration:  Concentration: Good and Attention Span: Good  Recall:  Good  Fund of Knowledge:  Good   Language:  Good  Akathisia:  Negative  Handed:  Right  AIMS (if indicated):     Assets:  Desire for Improvement Resilience  ADL's:  Intact  Cognition:  WNL  Sleep:  Number of Hours: 6.25     Treatment Plan Summary: Daily contact with patient to assess and evaluate symptoms and progress in treatment, Medication management and Plan : Patient is seen and examined.  Patient is a 24 year old male with the above-stated past psychiatric history who is seen in follow-up.  Diagnosis: #1 schizoaffective disorder; bipolar type versus schizophrenia  Patient is seen in follow-up.  He continues to improve.  No changes in medications.  His EKG is stable.  His Tegretol level is good, and the rest of his labs appear to be normal.  I anticipate discharge in 1 to 2 days.  #1 continue Cogentin 1 mg p.o. twice daily for side effects of medication. #2 continue Tegretol 200 mg p.o. 3 times daily for mood stability. #3 continue agitation protocol as needed if necessary. #4 continue Risperdal 3 mg p.o. twice daily for psychosis. #5 continue to lorazepam 15 mg p.o. nightly for insomnia. #6 continue trazodone 100 mg p.o. nightly as needed insomnia. #7 disposition planning-in progress. Sharma Covert, MD 11/15/2019, 11:25 AM

## 2019-11-15 NOTE — Progress Notes (Signed)
Adult Psychoeducational Group Note  Date:  11/15/2019 Time:  9:31 PM  Group Topic/Focus:  Wrap-Up Group:   The focus of this group is to help patients review their daily goal of treatment and discuss progress on daily workbooks.  Participation Level:  Active  Participation Quality:  Appropriate  Affect:  Appropriate  Cognitive:  Appropriate  Insight: Appropriate  Engagement in Group:  Developing/Improving  Modes of Intervention:  Discussion  Additional Comments:  Pt stated his goal for today was to focus on his treatment plan. Pt stated he accomplished his goal today. Pt stated his relationship with his family has improve since he was admitted. Pt stated been able to contact his mother and Grandmother today help improve his day. Pt stated he felt better about himself today. Pt rated his overall day a 10. Pt stated his appetite was pretty good today. Pt stated his sleep last night was pretty good. Pt stated he was in no physical pain today.  Pt deny auditory or visual hallucinations. Pt denies thoughts of harming himself or others. Pt stated he would alert staff if anything changes.   Lawrence [name] 11/15/2019, 9:31 PM

## 2019-11-15 NOTE — Progress Notes (Signed)
Adult Psychoeducational Group Note  Date:  11/15/2019 Time:  9:36 AM  Group Topic/Focus:  Goals Group:   The focus of this group is to help patients establish daily goals to achieve during treatment and discuss how the patient can incorporate goal setting into their daily lives to aide in recovery.  Participation Level:  Active  Participation Quality:  Appropriate  Affect:  Appropriate  Cognitive:  Appropriate  Insight: Appropriate  Engagement in Group:  Engaged  Modes of Intervention:  Discussion  Additional Comments:  Pt. participated during discussion and engaged with peers. Pt agreed to meet goal today of finding atleast one positive thing about today.We discussed healthy support systems and coping skills and patient mentioned speaking his thoughts with others helps him not feel like he is alone. Pt does not endorse SI/HI at this time.   Sandi Mariscal 11/15/2019, 9:36 AM

## 2019-11-15 NOTE — Progress Notes (Signed)
Adult Psychoeducational Group Note  Date:  11/15/2019 Time:  1:52 AM  Group Topic/Focus:  Wrap-Up Group:   The focus of this group is to help patients review their daily goal of treatment and discuss progress on daily workbooks.  Participation Level:  Active  Participation Quality:  Appropriate  Affect:  Appropriate  Cognitive:  Appropriate  Insight: Appropriate  Engagement in Group:  Developing/Improving  Modes of Intervention:  Discussion  Additional Comments: Pt stated his goal for today was to focus on his treatment plan. Pt stated he accomplished his goal today. Pt stated his relationship with his family has improved since he was admitted. Pt stated he felt better about himself today. Pt rated his overall day a 10. Pt stated his appetite was pretty good today. Pt stated his sleep last night was pretty good. Pt stated he was in no physical pain today.  Pt deny auditory or visual hallucinations. Pt denies thoughts of harming himself or others. Pt stated he would alert staff if anything changes.   Felipa Furnace 11/15/2019, 1:52 AM

## 2019-11-15 NOTE — Progress Notes (Signed)
   11/15/19 1930  COVID-19 Daily Checkoff  Have you had a fever (temp > 37.80C/100F)  in the past 24 hours?  No  If you have had runny nose, nasal congestion, sneezing in the past 24 hours, has it worsened? No  COVID-19 EXPOSURE  Have you traveled outside the state in the past 14 days? No  Have you been in contact with someone with a confirmed diagnosis of COVID-19 or PUI in the past 14 days without wearing appropriate PPE? No  Have you been living in the same home as a person with confirmed diagnosis of COVID-19 or a PUI (household contact)? No  Have you been diagnosed with COVID-19? No

## 2019-11-15 NOTE — Progress Notes (Signed)
   11/15/19 0800  Psych Admission Type (Psych Patients Only)  Admission Status Involuntary  Psychosocial Assessment  Patient Complaints None  Eye Contact Brief  Facial Expression Flat  Affect Preoccupied  Speech Soft;Slow;Tangential  Interaction Defensive;Minimal  Motor Activity Slow  Appearance/Hygiene Disheveled  Behavior Characteristics Cooperative  Mood Suspicious;Preoccupied  Aggressive Behavior  Effect No apparent injury  Thought Process  Coherency Tangential;Loose associations  Content Preoccupation;Paranoia;Delusions  Delusions Grandeur;Paranoid  Perception Derealization  Hallucination Auditory  Judgment Impaired  Confusion Mild  Danger to Self  Current suicidal ideation? Denies  Danger to Others  Danger to Others None reported or observed

## 2019-11-16 LAB — PROLACTIN: Prolactin: 43.1 ng/mL — ABNORMAL HIGH (ref 4.0–15.2)

## 2019-11-16 MED ORDER — CARBAMAZEPINE ER 200 MG PO TB12
200.0000 mg | ORAL_TABLET | Freq: Every day | ORAL | Status: DC
  Filled 2019-11-16 (×2): qty 21

## 2019-11-16 MED ORDER — CARBAMAZEPINE ER 200 MG PO TB12: ORAL_TABLET | ORAL | 2 refills | Status: DC

## 2019-11-16 MED ORDER — BENZTROPINE MESYLATE 1 MG PO TABS: 1.0000 mg | ORAL_TABLET | Freq: Two times a day (BID) | ORAL | 2 refills | Status: DC

## 2019-11-16 MED ORDER — RISPERIDONE 3 MG PO TABS: 3.0000 mg | ORAL_TABLET | Freq: Two times a day (BID) | ORAL | 2 refills | Status: DC

## 2019-11-16 MED ORDER — PALIPERIDONE PALMITATE ER 234 MG/1.5ML IM SUSY
234.0000 mg | PREFILLED_SYRINGE | Freq: Once | INTRAMUSCULAR | Status: AC
  Administered 2019-11-16: 13:00:00 234 mg via INTRAMUSCULAR
  Filled 2019-11-16: qty 1.5

## 2019-11-16 MED ORDER — PALIPERIDONE PALMITATE ER 234 MG/1.5ML IM SUSY: 234.0000 mg | PREFILLED_SYRINGE | Freq: Once | INTRAMUSCULAR | 11 refills | Status: DC

## 2019-11-16 NOTE — Plan of Care (Signed)
Pt discharged at 1430, left with his ride arranged by case manager, escorted out of building by [facility]. Pt given discharge instructions upon discharge, given discharge med education and prescriptions, follow up appointment instructions, all personal belongings returned to pt upon discharge. Pt upon discharge denies suicidal and homicidal ideation, denies hallucinations, denies feelings of depression and anxiety, is calm, and cooperative with discharge process, is alert and oriented to person, place, time and situation. No distress noted or reported no complaints voiced.    Problem: Education: Goal: Knowledge of [place] [facility] Education information/materials will improve Outcome: Adequate for Discharge Goal: Emotional status will improve Outcome: Adequate for Discharge Goal: Mental status will improve Outcome: Adequate for Discharge Goal: Verbalization of understanding the information provided will improve Outcome: Adequate for Discharge   Problem: Activity: Goal: Sleeping patterns will improve Outcome: Adequate for Discharge   Problem: Coping: Goal: Ability to verbalize frustrations and anger appropriately will improve Outcome: Adequate for Discharge Goal: Ability to demonstrate self-control will improve Outcome: Adequate for Discharge   Problem: Physical Regulation: Goal: Ability to maintain clinical measurements within normal limits will improve Outcome: Adequate for Discharge   Problem: Education: Goal: Will be free of psychotic symptoms Outcome: Adequate for Discharge   Problem: Education: Goal: Ability to state activities that reduce stress will improve Outcome: Adequate for Discharge   Problem: Education: Goal: Knowledge of [place] [facility] Education information/materials will improve Outcome: Adequate for Discharge Goal: Emotional status will improve Outcome: Adequate for Discharge Goal: Mental status will improve Outcome: Adequate for Discharge Goal:  Verbalization of understanding the information provided will improve Outcome: Adequate for Discharge   Problem: Activity: Goal: Interest or engagement in activities will improve Outcome: Adequate for Discharge Goal: Sleeping patterns will improve Outcome: Adequate for Discharge   Problem: Coping: Goal: Ability to verbalize frustrations and anger appropriately will improve Outcome: Adequate for Discharge Goal: Ability to demonstrate self-control will improve Outcome: Adequate for Discharge   Problem: Health Behavior/Discharge Planning: Goal: Identification of resources available to assist in meeting health care needs will improve Outcome: Adequate for Discharge Goal: Compliance with treatment plan for underlying cause of condition will improve Outcome: Adequate for Discharge   Problem: Physical Regulation: Goal: Ability to maintain clinical measurements within normal limits will improve Outcome: Adequate for Discharge   Problem: Safety: Goal: Periods of time without injury will increase Outcome: Adequate for Discharge   Problem: Education: Goal: Ability to state activities that reduce stress will improve Outcome: Adequate for Discharge   Problem: Coping: Goal: Ability to identify and develop effective coping behavior will improve Outcome: Adequate for Discharge   Problem: Self-Concept: Goal: Ability to identify factors that promote anxiety will improve Outcome: Adequate for Discharge Goal: Level of anxiety will decrease Outcome: Adequate for Discharge Goal: Ability to modify response to factors that promote anxiety will improve Outcome: Adequate for Discharge   Problem: Education: Goal: Utilization of techniques to improve thought processes will improve Outcome: Adequate for Discharge Goal: Knowledge of the prescribed therapeutic regimen will improve Outcome: Adequate for Discharge   Problem: Activity: Goal: Interest or engagement in leisure activities will  improve Outcome: Adequate for Discharge Goal: Imbalance in normal sleep/wake cycle will improve Outcome: Adequate for Discharge   Problem: Coping: Goal: Coping ability will improve Outcome: Adequate for Discharge Goal: Will verbalize feelings Outcome: Adequate for Discharge   Problem: Health Behavior/Discharge Planning: Goal: Ability to make decisions will improve Outcome: Adequate for Discharge Goal: Compliance with therapeutic regimen will improve Outcome: Adequate for Discharge  Problem: Role Relationship: Goal: Will demonstrate positive changes in social behaviors and relationships Outcome: Adequate for Discharge   Problem: Safety: Goal: Ability to disclose and discuss suicidal ideas will improve Outcome: Adequate for Discharge Goal: Ability to identify and utilize support systems that promote safety will improve Outcome: Adequate for Discharge   Problem: Self-Concept: Goal: Will verbalize positive feelings about self Outcome: Adequate for Discharge Goal: Level of anxiety will decrease Outcome: Adequate for Discharge   Problem: Activity: Goal: Will verbalize the importance of balancing activity with adequate rest periods Outcome: Adequate for Discharge   Problem: Education: Goal: Will be free of psychotic symptoms Outcome: Adequate for Discharge Goal: Knowledge of the prescribed therapeutic regimen will improve Outcome: Adequate for Discharge   Problem: Coping: Goal: Coping ability will improve Outcome: Adequate for Discharge Goal: Will verbalize feelings Outcome: Adequate for Discharge   Problem: Health Behavior/Discharge Planning: Goal: Compliance with prescribed medication regimen will improve Outcome: Adequate for Discharge   Problem: Nutritional: Goal: Ability to achieve adequate nutritional intake will improve Outcome: Adequate for Discharge   Problem: Role Relationship: Goal: Ability to communicate needs accurately will improve Outcome: Adequate  for Discharge Goal: Ability to interact with others will improve Outcome: Adequate for Discharge   Problem: Safety: Goal: Ability to redirect hostility and anger into socially appropriate behaviors will improve Outcome: Adequate for Discharge Goal: Ability to remain free from injury will improve Outcome: Adequate for Discharge   Problem: Self-Care: Goal: Ability to participate in self-care as condition permits will improve Outcome: Adequate for Discharge   Problem: Self-Concept: Goal: Will verbalize positive feelings about self Outcome: Adequate for Discharge

## 2019-11-16 NOTE — Progress Notes (Signed)
Recreation Therapy Notes  INPATIENT RECREATION TR PLAN  Patient Details Name: Lawrence [name] MRN: [id number] DOB: 1996/04/01 Today's Date: 11/16/2019  Rec Therapy Plan Is patient appropriate for Therapeutic Recreation?: Yes Treatment times per week: about 3 days Estimated Length of Stay: 5-7 days TR Treatment/Interventions: Group participation (Comment)  Discharge Criteria Pt will be discharged from therapy if:: Discharged Treatment plan/goals/alternatives discussed and agreed upon by:: Patient/family  Discharge Summary Short term goals set: See patient care plan Short term goals met: Not met Progress toward goals comments: Groups attended Which groups?: Coping skills Reason goals not met: Pt unable to identify triggers Therapeutic equipment acquired: N/A Reason patient discharged from therapy: Discharge from hospital Pt/family agrees with progress & goals achieved: Yes Date patient discharged from therapy: 11/16/19    Victorino Sparrow, LRT/CTRS  Ria Comment, [place] 11/16/2019, 12:07 PM

## 2019-11-16 NOTE — Progress Notes (Signed)
Recreation Therapy Notes  Date: 3.29.21 Time: 1000 Location: [address] Dayroom  Group Topic: Coping Skills  Goal Area(s) Addresses:  Patient will identify positive coping skills. Patient will identify benefit of using coping skills.  Behavioral Response: Minimal  Intervention: Worksheet, pencils, white board, marker  Activity: Mind Map.  LRT and patients filled out the first 8 boxes (anger, sadness, depression, anxiety, arguments, self esteem, lack of coping skills and guilt) of the mind map together.  Patients were then given time to come up with at least 3 coping skills for each area identified.  The group would come back together and LRT would write the coping skills on the board.  Education: Pharmacologist, Building control surveyor.   Education Outcome: Acknowledges understanding/In group clarification offered/Needs additional education.   Clinical Observations/Feedback: Pt was seen filling out his sheet but did not share any of his coping skills with the group.    Caroll Rancher, LRT/CTRS     Caroll Rancher A 11/16/2019 11:29 AM

## 2019-11-16 NOTE — Progress Notes (Signed)
  [facility] Adult Case Management Discharge Plan :  Will you be returning to the same living situation after discharge:  Yes,  grandmother's house. At discharge, do you have transportation home?: Yes,  grandmother will pick up. Do you have the ability to pay for your medications: No. Patient declined follow up.  Release of information consent forms completed and in the chart.  Patient to Follow up at: Follow-up Information    Patient Declined Follow up.   Contact information: Patient declined all follow up.          Next level of care provider has access to [facility] Link:no  Safety Planning and Suicide Prevention discussed: Yes,  with patient. Patient declined consents.  Has patient been referred to the Quitline?: N/A patient is not a smoker  Patient has been referred for addiction treatment: Pt. refused referral  Darreld Mclean, LCSWA 11/16/2019, 10:04 AM

## 2019-11-16 NOTE — BHH Suicide Risk Assessment (Signed)
[facility] Discharge Suicide Risk Assessment   Principal Problem: <principal problem not specified> Discharge Diagnoses: Active Problems:   Schizophrenia (HCC)   Total Time spent with patient: 45 minutes Musculoskeletal: Strength & Muscle Tone: within normal limits Gait & Station: normal Patient leans: N/A  Psychiatric Specialty Exam: Physical Exam  Review of Systems  Blood pressure 120/79, pulse 88, temperature 98.3 F (36.8 C), temperature source Oral, resp. rate 16, height 6\' 1"  (1.854 m), weight 83.9 kg, SpO2 100 %.Body mass index is 24.41 kg/m.  General Appearance: Casual  Eye Contact:  Good  Speech:  Clear and Coherent  Volume:  Normal  Mood:  Euthymic  Affect:  Appropriate  Thought Process:  Coherent and Goal Directed  Orientation:  Full (Time, Place, and Person)  Thought Content:  Logical  Suicidal Thoughts:  No  Homicidal Thoughts:  No  Memory:  Recent;   Good Remote;   Good  Judgement:  Good  Insight:  Good  Psychomotor Activity:  Normal  Concentration:  Concentration: Good and Attention Span: Good  Recall:  Good  Fund of Knowledge:  Good  Language:  Good  Akathisia:  Negative  Handed:  Right  AIMS (if indicated):     Assets:  Social Support Talents/Skills  ADL's:  Intact  Cognition:  WNL  Sleep:  Number of Hours: 3.75     Mental Status Per Nursing Assessment::   On Admission:  NA  Demographic Factors:  Male  Loss Factors: NA  Historical Factors: NA  Risk Reduction Factors:   Sense of responsibility to family and Religious beliefs about death  Continued Clinical Symptoms:  Alcohol/Substance Abuse/Dependencies  Cognitive Features That Contribute To Risk:  None    Suicide Risk:  Minimal: No identifiable suicidal ideation.  Patients presenting with no risk factors but with morbid ruminations; may be classified as minimal risk based on the severity of the depressive symptoms  Follow-up Information    Patient Declined Follow up.   Contact  information: Patient declined all follow up.          Plan Of Care/Follow-up recommendations:  Activity:  full  Lawrence Reinertsen, MD 11/16/2019, 10:12 AM

## 2019-11-16 NOTE — Plan of Care (Signed)
Pt was unable to identify triggers or coping skills for anger at completion of recreation therapy group session.   Caroll Rancher, LRT/CTRS

## 2019-11-16 NOTE — Discharge Summary (Signed)
Physician Discharge Summary Note  Patient:  Lawrence [name] is an 24 y.o., male MRN:  [id number] DOB:  1996-06-10 Patient phone:  [phone] (home)  Patient address:   [address] [place] [address],  Total Time spent with patient: 45 minutes  Date of Admission:  11/11/2019 Date of Discharge: 11/16/2019  Reason for Admission:    This is the latest of multiple admissions for Lawrence [name], his first here since April 2020 to early May 2020, he has a known history of a schizoaffective bipolar type condition (versus schizophrenia) complicated by chronic poor compliance and chronic cannabis dependency.  Lawrence [name] presented on 3/24 under petition for involuntary commitment, parents are concerned about noncompliance, paranoia, and hyperreligiosity was noted upon presentation.  Further, he has not been eating in several days describing himself as fasting and even described himself as "Lawrence [name]" to the examiner yesterday.  Reports the patient has been noncompliant with medications and follow-up at [facility] over the past year has continued due to use cannabis probably daily, and is clearly in need of inpatient stabilization  When I interviewed him he is alert and oriented to person he states "I am going through a religious experience" and describes his fasting and "nothing is wrong with it" and then with more detailed questioning his speech tends to deteriorate to more nonsensical statements but he denies auditory or visual hallucinations at the present time or thoughts of harming self or others.  Progress screen pending lab results are nondiagnostic TSH normal  Principal Problem: Noncompliance/exacerbation and psychotic disorder/cannabis dependence Discharge Diagnoses: Active Problems:   Schizophrenia [facility])   Past Psychiatric History: See eval multiple similar presentations  Past Medical History:  Past Medical History:  Diagnosis Date  . ADD (attention deficit disorder)   . Asthma   .  Depressed   . Schizoaffective disorder (HCC)    History reviewed. No pertinent surgical history. Family History: History reviewed. No pertinent family history. Family Psychiatric  History: See eval Social History:  Social History   Substance and Sexual Activity  Alcohol Use Not Currently     Social History   Substance and Sexual Activity  Drug Use Yes  . Frequency: 2.0 times per week  . Types: Marijuana   Comment: UTA recent use    Social History   Socioeconomic History  . Marital status: Single    Spouse name: Not on file  . Number of children: Not on file  . Years of education: Not on file  . Highest education level: Not on file  Occupational History  . Occupation: Unemployed  Tobacco Use  . Smoking status: Current Some Day Smoker    Years: 0.00    Types: Cigarettes  . Smokeless tobacco: Never Used  Substance and Sexual Activity  . Alcohol use: Not Currently  . Drug use: Yes    Frequency: 2.0 times per week    Types: Marijuana    Comment: UTA recent use  . Sexual activity: Never  Other Topics Concern  . Not on file  Social History Narrative   Pt is unemployed; lives with grandmother   Social Determinants of Health   Financial Resource Strain:   . Difficulty of Paying Living Expenses:   Food Insecurity:   . Worried About Programme researcher, broadcasting/film/video in the Last Year:   . Barista in the Last Year:   Transportation Needs:   . Freight forwarder (Medical):   Marland Kitchen Lack of Transportation (Non-Medical):   Physical Activity:   .  Days of Exercise per Week:   . Minutes of Exercise per Session:   Stress:   . Feeling of Stress :   Social Connections:   . Frequency of Communication with Friends and Family:   . Frequency of Social Gatherings with Friends and Family:   . Attends Religious Services:   . Active Member of Clubs or Organizations:   . Attends Banker Meetings:   Marland Kitchen Marital Status:     Hospital Course:   Lawrence [name] was admitted under  routine precautions and they never required an escalation to higher than 15-minute checks he generally stayed in his room state himself but became more socializes negative symptoms improved.  He was compliant here which of course is never the issue but due to a history of noncompliance was administered long-acting injectable prior to discharge as discussed below.  He reported very increased level of sedation on the meds slurring his words and stating he wet the bed a couple times we cut down on the medication and this proved very effective normally for psychosis but with the side effects mentioned.  By the date of the 29th is Tegretol level was therapeutic at 2.1 he was alert and oriented without acute psychosis without the previously expressed hyperreligious and/or delusional material without acute auditory and visual hallucinations.  My emphasis with him this time is of course to stay drug-free but also to finally comply more meaningfully with medications and we did order long-acting injectable paliperidone today - due 1 month from 3/29 to next dose of 4/28 or 29   Physical Findings: AIMS: Facial and Oral Movements Muscles of Facial Expression: None, normal Lips and Perioral Area: None, normal Jaw: None, normal Tongue: None, normal, ,  ,  ,    CIWA:  CIWA-Ar Total: 0 COWS:     Musculoskeletal: Strength & Muscle Tone: within normal limits Gait & Station: normal Patient leans: N/A  Psychiatric Specialty Exam: Physical Exam  Review of Systems  Blood pressure 120/79, pulse 88, temperature 98.3 F (36.8 C), temperature source Oral, resp. rate 16, height 6\' 1"  (1.854 m), weight 83.9 kg, SpO2 100 %.Body mass index is 24.41 kg/m.  General Appearance: Casual  Eye Contact:  Good  Speech:  Clear and Coherent  Volume:  Normal  Mood:  Euthymic  Affect:  Appropriate  Thought Process:  Coherent and Goal Directed  Orientation:  Full (Time, Place, and Person)  Thought Content:  Logical   Suicidal Thoughts:  No  Homicidal Thoughts:  No  Memory:  Recent;   Good Remote;   Good  Judgement:  Good  Insight:  Good  Psychomotor Activity:  Normal  Concentration:  Concentration: Good and Attention Span: Good  Recall:  Good  Fund of Knowledge:  Good  Language:  Good  Akathisia:  Negative  Handed:  Right  AIMS (if indicated):     Assets:  Social Support Talents/Skills  ADL's:  Intact  Cognition:  WNL  Sleep:  Number of Hours: 3.75        Has this patient used any form of tobacco in the last 30 days? (Cigarettes, Smokeless Tobacco, Cigars, and/or Pipes) Yes, No  Blood Alcohol level:  Lab Results  Component Value Date   [facility] <10 11/12/2019   ETH <10 09/20/2017    Metabolic Disorder Labs:  Lab Results  Component Value Date   HGBA1C 5.6 11/14/2019   MPG 114.02 11/14/2019   MPG 114.02 12/14/2018   Lab Results  Component Value Date  PROLACTIN 43.1 (H) 11/14/2019   PROLACTIN 35.6 (H) 11/12/2019   Lab Results  Component Value Date   CHOL 141 11/14/2019   TRIG 52 11/14/2019   HDL 42 11/14/2019   CHOLHDL 3.4 11/14/2019   VLDL 10 11/14/2019   LDLCALC 89 11/14/2019   LDLCALC 50 12/14/2018    See Psychiatric Specialty Exam and Suicide Risk Assessment completed by Attending Physician prior to discharge.  Discharge destination:  Home   Recommended Plan for Multiple Antipsychotic Therapies: NA   Allergies as of 11/16/2019      Reactions   Bee Venom Anaphylaxis   Apple    Corn-containing Products    Lac Bovis Itching   Milk Related   Other    Green Beans:  Other reaction(s): ASTHMA, RASH   Peanut Butter Flavor Cough      Medication List    STOP taking these medications   carbamazepine 100 MG chewable tablet Commonly known as: TEGRETOL Replaced by: carbamazepine 200 MG 12 hr tablet   traZODone 100 MG tablet Commonly known as: DESYREL     TAKE these medications     Indication  benztropine 1 MG tablet Commonly known as: COGENTIN Take 1  tablet (1 mg total) by mouth 2 (two) times daily.  Indication: Extrapyramidal Reaction caused by Medications   carbamazepine 200 MG 12 hr tablet Commonly known as: TEGretol-XR 1 in am  2 at hs Replaces: carbamazepine 100 MG chewable tablet  Indication: Manic-Depression   paliperidone 234 MG/1.5ML Susy injection Commonly known as: INVEGA SUSTENNA Inject 234 mg into the muscle once for 1 dose. Due 4/28 What changed: additional instructions  Indication: Schizoaffective Disorder   risperiDONE 3 MG tablet Commonly known as: RISPERDAL Take 1 tablet (3 mg total) by mouth 2 (two) times daily. What changed:   how much to take  when to take this  Indication: Schizophrenia      Follow-up Information    Patient Declined Follow up.   Contact information: Patient declined all follow up.          SignedJohnn Hai, MD 11/16/2019, 10:08 AM

## 2019-11-16 NOTE — BHH Counselor (Signed)
Patient continues to decline outpatient follow up.  Enid Cutter, MSW, LCSW-A Clinical Social Worker [facility]  [phone]

## 2020-12-16 ENCOUNTER — Emergency Department (HOSPITAL_COMMUNITY)
Admission: EM | Admit: 2020-12-16 | Discharge: 2020-12-17 | Disposition: A | Discharge status: Other Acute Inpatient Hospital | Payer: 59 | Attending: Emergency Medicine | Admitting: Emergency Medicine

## 2020-12-16 ENCOUNTER — Other Ambulatory Visit: Payer: Self-pay

## 2020-12-16 ENCOUNTER — Encounter (HOSPITAL_COMMUNITY): Payer: Self-pay | Admitting: *Deleted

## 2020-12-16 DIAGNOSIS — R739 Hyperglycemia, unspecified: Secondary | ICD-10-CM | POA: Diagnosis not present

## 2020-12-16 DIAGNOSIS — R45850 Homicidal ideations: Secondary | ICD-10-CM | POA: Insufficient documentation

## 2020-12-16 DIAGNOSIS — F17210 Nicotine dependence, cigarettes, uncomplicated: Secondary | ICD-10-CM | POA: Diagnosis not present

## 2020-12-16 DIAGNOSIS — Z20822 Contact with and (suspected) exposure to covid-19: Secondary | ICD-10-CM | POA: Insufficient documentation

## 2020-12-16 DIAGNOSIS — J45909 Unspecified asthma, uncomplicated: Secondary | ICD-10-CM | POA: Diagnosis not present

## 2020-12-16 DIAGNOSIS — F909 Attention-deficit hyperactivity disorder, unspecified type: Secondary | ICD-10-CM | POA: Insufficient documentation

## 2020-12-16 DIAGNOSIS — Z91010 Allergy to peanuts: Secondary | ICD-10-CM | POA: Insufficient documentation

## 2020-12-16 DIAGNOSIS — F250 Schizoaffective disorder, bipolar type: Secondary | ICD-10-CM | POA: Diagnosis present

## 2020-12-16 LAB — RESP PANEL BY RT-PCR (FLU A&B, COVID) ARPGX2
Influenza A by PCR: NEGATIVE
Influenza B by PCR: NEGATIVE
SARS Coronavirus 2 by RT PCR: NEGATIVE

## 2020-12-16 LAB — CBC WITH DIFFERENTIAL/PLATELET
Abs Immature Granulocytes: 0.04 10*3/uL (ref 0.00–0.07)
Basophils Absolute: 0.1 10*3/uL (ref 0.0–0.1)
Basophils Relative: 1 %
Eosinophils Absolute: 0.4 10*3/uL (ref 0.0–0.5)
Eosinophils Relative: 4 %
HCT: 49.3 % (ref 39.0–52.0)
Hemoglobin: 16.4 g/dL (ref 13.0–17.0)
Immature Granulocytes: 0 %
Lymphocytes Relative: 15 %
Lymphs Abs: 1.4 10*3/uL (ref 0.7–4.0)
MCH: 30.1 pg (ref 26.0–34.0)
MCHC: 33.3 g/dL (ref 30.0–36.0)
MCV: 90.5 fL (ref 80.0–100.0)
Monocytes Absolute: 0.4 10*3/uL (ref 0.1–1.0)
Monocytes Relative: 4 %
Neutro Abs: 7 10*3/uL (ref 1.7–7.7)
Neutrophils Relative %: 76 %
Platelets: 191 10*3/uL (ref 150–400)
RBC: 5.45 MIL/uL (ref 4.22–5.81)
RDW: 12.7 % (ref 11.5–15.5)
WBC: 9.2 10*3/uL (ref 4.0–10.5)
nRBC: 0 % (ref 0.0–0.2)

## 2020-12-16 LAB — RAPID URINE DRUG SCREEN, HOSP PERFORMED
Amphetamines: NOT DETECTED
Barbiturates: NOT DETECTED
Benzodiazepines: NOT DETECTED
Cocaine: NOT DETECTED
Opiates: NOT DETECTED
Tetrahydrocannabinol: NOT DETECTED

## 2020-12-16 LAB — COMPREHENSIVE METABOLIC PANEL
ALT: 18 U/L (ref 0–44)
AST: 24 U/L (ref 15–41)
Albumin: 4.6 g/dL (ref 3.5–5.0)
Alkaline Phosphatase: 75 U/L (ref 38–126)
Anion gap: 7 (ref 5–15)
BUN: 9 mg/dL (ref 6–20)
CO2: 25 mmol/L (ref 22–32)
Calcium: 9.2 mg/dL (ref 8.9–10.3)
Chloride: 105 mmol/L (ref 98–111)
Creatinine, Ser: 1.06 mg/dL (ref 0.61–1.24)
GFR, Estimated: >60 mL/min (ref >60)
Glucose, Bld: 100 mg/dL — ABNORMAL HIGH (ref 70–99)
Potassium: 3.9 mmol/L (ref 3.5–5.1)
Sodium: 137 mmol/L (ref 135–145)
Total Bilirubin: 0.7 mg/dL (ref 0.3–1.2)
Total Protein: 7.8 g/dL (ref 6.5–8.1)

## 2020-12-16 LAB — SALICYLATE LEVEL: Salicylate Lvl: <7 mg/dL — ABNORMAL LOW (ref 7.0–30.0)

## 2020-12-16 LAB — ACETAMINOPHEN LEVEL: Acetaminophen (Tylenol), Serum: <10 ug/mL — ABNORMAL LOW (ref 10–30)

## 2020-12-16 LAB — ETHANOL: Alcohol, Ethyl (B): <10 mg/dL (ref <10)

## 2020-12-16 NOTE — ED Notes (Signed)
Pt dressed out in scrubs. Pt has one belonging's bag placed in 9-25 cabinet. Pt calm and cooperative at this time. Pt also made aware the need for a urine sample. Pt has not provided one thus far.

## 2020-12-16 NOTE — BH Assessment (Signed)
TTS spoke with Marchelle Folks NS, to put Pt in private room to complete TTS assessment.  Clinican to call the cart in ten minutes.

## 2020-12-16 NOTE — ED Notes (Signed)
Patient continues to rest in bed with eyes closed

## 2020-12-16 NOTE — ED Notes (Signed)
Patient provided with dinner tray at this time. 

## 2020-12-16 NOTE — ED Notes (Signed)
Patient resting quietly in hall c
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
## 2020-12-16 NOTE — ED Provider Notes (Signed)
[facility]-EMERGENCY DEPT Provider Note   CSN: [id number] Arrival date & time: 12/16/20  1221     History Chief Complaint  Patient presents with  . Mental Health Problem    Lawrence [name] is a 25 y.o. male.  HPI   Patient with ADHD, asthma, depression, schizoaffective disorder presents with chief complaint of feeling like he is manic.  Patient endorses that over the last couple days he has felt like he has been on more edge, has thought about hurting people but does not have anyone in particular that he would hurt, has no plans of doing so, never hurt anyone in the past.  He denies flight of thoughts, unreasonable spending, states she is able to sleep, never had a manic episode in the past.  He denies homicidal ideations, denies hallucinations or delusions, denies illicit drug use.  States he has not been taking his medications as prescribed.  He has no other complaints at this time.  He denies headaches, fevers, chills, shortness of breath, chest pain, abdominal pain, nausea, vomiting, diarrhea, or some pedal edema.  Past Medical History:  Diagnosis Date  . ADD (attention deficit disorder)   . Asthma   . Depressed   . Schizoaffective disorder [facility])     Patient Active Problem List   Diagnosis Date Noted  . Schizophrenia (HCC) 11/11/2019  . Schizoaffective disorder, bipolar type (HCC) 01/09/2017  . Intermittent explosive disorder 05/13/2016  . Intoxication by drug (HCC) 07/12/2014  . Intoxication 07/12/2014  . Depression 07/12/2014  . Overdose of antitussive     History reviewed. No pertinent surgical history.     No family history on file.  Social History   Tobacco Use  . Smoking status: Current Some Day Smoker    Years: 0.00    Types: Cigarettes  . Smokeless tobacco: Never Used  Vaping Use  . Vaping Use: Never used  Substance Use Topics  . Alcohol use: Not Currently  . Drug use: Yes    Frequency: 2.0 times per week    Types: Marijuana     Comment: UTA recent use    Home Medications Prior to Admission medications   Medication Sig Start Date End Date Taking? Authorizing Provider  benztropine (COGENTIN) 1 MG tablet Take 1 tablet (1 mg total) by mouth 2 (two) times daily. Patient not taking: Reported on 12/16/2020 11/16/19   Malvin Johns, MD  carbamazepine (TEGRETOL-XR) 200 MG 12 hr tablet 1 in am  2 at hs Patient not taking: Reported on 12/16/2020 11/16/19   Malvin Johns, MD  paliperidone (INVEGA SUSTENNA) 234 MG/1.5ML SUSY injection Inject 234 mg into the muscle once for 1 dose. Due 4/28 Patient not taking: Reported on 12/16/2020 11/16/19 11/16/19  Malvin Johns, MD  risperiDONE (RISPERDAL) 3 MG tablet Take 1 tablet (3 mg total) by mouth 2 (two) times daily. Patient not taking: Reported on 12/16/2020 11/16/19   Malvin Johns, MD    Allergies    Bee venom, Apple, Corn-containing products, Lac bovis, Other, and Peanut butter flavor  Review of Systems   Review of Systems  Constitutional: Negative for chills and fever.  HENT: Negative for congestion.   Respiratory: Negative for shortness of breath.   Cardiovascular: Negative for chest pain.  Gastrointestinal: Negative for abdominal pain.  Genitourinary: Negative for enuresis.  Musculoskeletal: Negative for back pain.  Skin: Negative for rash.  Neurological: Negative for dizziness.  Hematological: Does not bruise/bleed easily.  Psychiatric/Behavioral: Negative for hallucinations, self-injury, sleep disturbance and suicidal ideas. The  patient is nervous/anxious. The patient is not hyperactive.     Physical Exam Updated Vital Signs BP 134/85 (BP Location: Left Arm)   Pulse 92   Temp 98.4 F (36.9 C) (Oral)   Resp 18   SpO2 97%   Physical Exam Vitals and nursing note reviewed.  Constitutional:      General: He is not in acute distress.    Appearance: He is not ill-appearing.  HENT:     Head: Normocephalic and atraumatic.     Nose: No congestion.  Eyes:      Conjunctiva/sclera: Conjunctivae normal.  Cardiovascular:     Rate and Rhythm: Normal rate and regular rhythm.     Pulses: Normal pulses.     Heart sounds: No murmur heard. No friction rub. No gallop.   Pulmonary:     Effort: No respiratory distress.     Breath sounds: No wheezing, rhonchi or rales.  Abdominal:     Palpations: Abdomen is soft.     Tenderness: There is no abdominal tenderness.  Skin:    General: Skin is warm and dry.  Neurological:     Mental Status: He is alert.  Psychiatric:        Mood and Affect: Mood normal.     ED Results / Procedures / Treatments   Labs (all labs ordered are listed, but only abnormal results are displayed) Labs Reviewed  COMPREHENSIVE METABOLIC PANEL - Abnormal; Notable for the following components:      Result Value   Glucose, Bld 100 (*)    All other components within normal limits  SALICYLATE LEVEL - Abnormal; Notable for the following components:   Salicylate Lvl <7.0 (*)    All other components within normal limits  ACETAMINOPHEN LEVEL - Abnormal; Notable for the following components:   Acetaminophen (Tylenol), Serum <10 (*)    All other components within normal limits  RESP PANEL BY RT-PCR (FLU A&B, COVID) ARPGX2  ETHANOL  RAPID URINE DRUG SCREEN, HOSP PERFORMED  CBC WITH DIFFERENTIAL/PLATELET    EKG EKG Interpretation  Date/Time:  Friday December 16 2020 14:09:00 EDT Ventricular Rate:  91 PR Interval:  146 QRS Duration: 94 QT Interval:  358 QTC Calculation: 440 R Axis:   110 Text Interpretation: Normal sinus rhythm Right axis deviation Possible Right ventricular hypertrophy Abnormal ECG No acute changes Confirmed by Derwood Kaplan [phone]) on 12/16/2020 5:25:03 PM   Radiology No results found.  Procedures Procedures   Medications Ordered in ED Medications - No data to display  ED Course  I have reviewed the triage vital signs and the nursing notes.  Pertinent labs & imaging results that were available during  my care of the patient were reviewed by me and considered in my medical decision making (see chart for details).    MDM Rules/Calculators/A&P                          Initial impression-patient presents with chief complaint of manic episode.  He is alert, does not appear in acute distress, vital signs reassuring.  Will obtain med clearance work-up, consult TTS for further recommendations.  Work-up-CBC unremarkable, CMP shows slight hyperglycemia 100, ethanol less than 10, acetaminophen less than 10, salicylate less than 7, respiratory panel negative.  EKG sinus without signs of ischemia  Consult TTS pending at this time.  Rule out-low suspicion for systemic infection as patient is nontoxic-appearing, vital signs reassuring, no obvious source infection on my exam.  Low  suspicion for ACS as patient denies chest pain, shortness of breath, EKG is without signs of ischemia.  Low suspicion for intra-abdominal abnormality as abdomen soft nontender to palpation, patient tolerating p.o.  Plan-patient is medically cleared at this time, patient will be placed in a psych hold, home meds will be ordered if any, will await TTS recommendations for final disposition.  Patient is not IVC at this time.    Final Clinical Impression(s) / ED Diagnoses Final diagnoses:  Homicidal ideations    Rx / DC Orders ED Discharge Orders    None       Carroll Sage, PA-C 12/16/20 2255    Derwood Kaplan, MD 12/17/20 [phone]

## 2020-12-17 ENCOUNTER — Ambulatory Visit (HOSPITAL_COMMUNITY)
Admission: EM | Admit: 2020-12-17 | Discharge: 2020-12-17 | Disposition: A | Discharge status: Home / Self Care | Payer: 59 | Attending: Student | Admitting: Student

## 2020-12-17 ENCOUNTER — Encounter (HOSPITAL_COMMUNITY): Payer: Self-pay | Admitting: Registered Nurse

## 2020-12-17 DIAGNOSIS — F6381 Intermittent explosive disorder: Secondary | ICD-10-CM | POA: Diagnosis not present

## 2020-12-17 DIAGNOSIS — F250 Schizoaffective disorder, bipolar type: Secondary | ICD-10-CM | POA: Diagnosis present

## 2020-12-17 MED ORDER — HYDROXYZINE HCL 25 MG PO TABS: 25.0000 mg | ORAL_TABLET | Freq: Three times a day (TID) | ORAL | Status: DC | PRN

## 2020-12-17 MED ORDER — ALUM & MAG HYDROXIDE-SIMETH 200-200-20 MG/5ML PO SUSP: 30.0000 mL | ORAL | Status: DC | PRN

## 2020-12-17 MED ORDER — MAGNESIUM HYDROXIDE 400 MG/5ML PO SUSP: 30.0000 mL | Freq: Every day | ORAL | Status: DC | PRN

## 2020-12-17 NOTE — Discharge Summary (Signed)
[name] to be D/C'd home per NP order. Discussed with the patient and all questions fully answered. An After Visit Summary was printed and given to the patient. Patient escorted out and D/C home via private auto.  Dickie La  12/17/2020 2:00 PM

## 2020-12-17 NOTE — Progress Notes (Signed)
Pt is alert and oriented. Pt did not voice any complaints of pain or discomfort. Pt denies SI, HI and AVH at this time. Staff will monitor for pt's safety. 

## 2020-12-17 NOTE — ED Notes (Signed)
Safe transport called 

## 2020-12-17 NOTE — ED Provider Notes (Signed)
Behavioral Health Admission H&P [facility] & OBS)  Date: 12/17/20 Patient Name: Lawrence [name] MRN: [id number] Chief Complaint:  Chief Complaint  Patient presents with  . Manic Behavior      Diagnoses:  Final diagnoses:  Schizoaffective disorder, bipolar type (HCC)    HPI: Lawrence [name] is a 25 year old male with documented past psychiatric history of schizoaffective disorder, bipolar type who presents to the behavioral health urgent care as a voluntary direct admit transfer from [facility] long emergency department.  Per chart review, patient presented to [facility] emergency department on 12/16/2020 for personal concerns of manic behavior.  TTS was consulted in the ED period patient was assessed by TTS on 12/17/2020.  Patient refused for TTS to obtain collateral information upon TTS assessment while patient was in the ED.  Due to report from TTS that patient was endorsing thoughts of wanting to harm others but refusing to further elaborate on these thoughts as well as refusing for collateral information to be obtained, it was recommended by myself that the patient be transferred from [facility] long emergency department to the behavior health urgent care for continuous observation/assessment.  Patient assessed by myself upon his arrival to [facility] from the emergency department.  Patient is minimally cooperative throughout the assessment and often refuses to elaborate on answers to questions or answer certain questions.  Patient reports that he went to the emergency department on 12/16/2020 voluntarily after having a verbal altercation with his grandmother.  Patient does not elaborate on this further when asked to provide further details regarding the altercation.  Patient denies SI or past suicide attempts.  He endorses history of self-injurious behavior via cutting, but he states that he has not engaged in self-injurious behavior via cutting since middle school.  Patient denies any history of intentionally  burning himself.  Patient denies HI, AVH, paranoia, delusions, or manic symptoms.  Patient reports that he lives in [place] with his grandmother.  Patient denies access to guns or weapons.  Patient denies alcohol or illicit drug use.  Patient endorses smoking 1.5 packs/day of cigarettes for the past 6 years.  Per chart review, patient was hospitalized at [facility] from 12/14/2018 to 12/18/2018 on 1 occasion and was also hospitalized at [facility] behavioral health on another occasion from 11/11/2019 to 11/16/2019.  Upon discharge from behavioral health Hospital on 11/16/2019, chart review shows that patient was instructed to take Cogentin 1 mg p.o. twice daily for EPS symptoms caused by medications, Tegretol 200 mg 12-hour tablet: 1 tablet in the morning and 2 tablets at bedtime for manic depression, Invega 234 mg IM injection for schizoaffective disorder, and Risperdal 3 mg p.o. twice daily for schizophrenia.  Patient reports that he has not taken any psychotropic medications since his discharge from behavioral health Hospital in March 2021.  Patient also reports that he does not see a psychiatrist or therapist because he does not think that he needs 1.  Patient states that he does not want to take psychotropic medications and he states that he does not believe in medications.  Patient denies any additional psychiatric hospitalizations since his March 2021 behavioral health hospital admission.  PHQ 2-9:   Flowsheet Row ED from 12/16/2020 in [facility] Admission (Discharged) from OP [facility] (Discharged) from [facility]  C-SSRS RISK CATEGORY No Risk No Risk Error: Question 6 not populated       Total Time  spent with patient: 30 minutes  Musculoskeletal  Strength & Muscle Tone: within normal limits Gait & Station: normal Patient leans:  N/A  Psychiatric Specialty Exam  Presentation General Appearance: Bizarre; Disheveled  Eye Contact:Fair; Fleeting  Speech:Clear and Coherent; Normal Rate  Speech Volume:Normal  Handedness:No data recorded  Mood and Affect  Mood:Anxious  Affect:Congruent   Thought Process  Thought Processes:Coherent; Goal Directed  Descriptions of Associations:Circumstantial  Orientation:Full (Time, Place and Person)  Thought Content:Scattered  Diagnosis of Schizophrenia or Schizoaffective disorder in past: Yes  Duration of Psychotic Symptoms: Greater than six months  Hallucinations:Hallucinations: None (Patient denies AVH on exam.)  Ideas of Reference:None (Patient denies.)  Suicidal Thoughts:Suicidal Thoughts: No (Patient denies.)  Homicidal Thoughts:Homicidal Thoughts: No   Sensorium  Memory:Immediate Fair; Recent Fair; Remote Fair  Judgment:Fair  Insight:Fair   Executive Functions  Concentration:Fair  Attention Span:Fair  Recall:Fair  Fund of Knowledge:Fair  Language:Fair   Psychomotor Activity  Psychomotor Activity:Psychomotor Activity: Normal   Assets  Assets:Communication Skills; Financial Resources/Insurance; Housing; Leisure Time; Physical Health   Sleep  Sleep:Sleep: Fair   Nutritional Assessment (For OBS and [facility]BC admissions only) Has the patient had a weight loss or gain of 10 pounds or more in the last 3 months?: No Has the patient had a decrease in food intake/or appetite?: No Does the patient have dental problems?: No Does the patient have eating habits or behaviors that may be indicators of an eating disorder including binging or inducing vomiting?: No Has the patient recently lost weight without trying?: No Has the patient been eating poorly because of a decreased appetite?: No Malnutrition Screening Tool Score: 0    Physical Exam Vitals reviewed.  Constitutional:      General: He is not in acute distress.    Appearance: He is not  ill-appearing, toxic-appearing or diaphoretic.  HENT:     Head: Normocephalic and atraumatic.     Right Ear: External ear normal.     Left Ear: External ear normal.  Cardiovascular:     Rate and Rhythm: Normal rate.  Pulmonary:     Effort: Pulmonary effort is normal. No respiratory distress.  Musculoskeletal:        General: Normal range of motion.     Cervical back: Normal range of motion.  Neurological:     Mental Status: He is alert and oriented to person, place, and time.     Comments: No tremor noted.   Psychiatric:        Attention and Perception: He does not perceive auditory or visual hallucinations.        Mood and Affect: Mood is anxious.        Speech: Speech normal.        Behavior: Behavior is not agitated, slowed, aggressive, withdrawn, hyperactive or combative.        Thought Content: Thought content is not paranoid or delusional. Thought content does not include suicidal ideation.     Comments: Affect mood congruent. Patient is minimally cooperative throughout the assessment and often refuses to elaborate on answers to questions or answer certain questions. Patient denies HI on exam, but endorsed thoughts of wanting to harm others per TTS evaluation.     Review of Systems  Constitutional: Negative for chills, diaphoresis, fever, malaise/fatigue and weight loss.  HENT: Negative for congestion.   Respiratory: Negative for cough and shortness of breath.   Cardiovascular: Negative for chest pain and palpitations.  Gastrointestinal: Negative for abdominal pain, constipation, diarrhea, nausea and vomiting.  Musculoskeletal:  Negative for joint pain and myalgias.  Neurological: Negative for dizziness and headaches.  Psychiatric/Behavioral: Negative for depression, hallucinations, memory loss, substance abuse and suicidal ideas. The patient is nervous/anxious. The patient does not have insomnia.   All other systems reviewed and are negative.   Vitals: Blood pressure  127/79, pulse 70, temperature 98.3 F (36.8 C), temperature source Tympanic, resp. rate 18, SpO2 100 %. There is no height or weight on file to calculate BMI.  Past Psychiatric History: Schizoaffective Disorder, Bipolar Type   Is the patient at risk to self? No  Has the patient been a risk to self in the past 6 months? No .    Has the patient been a risk to self within the distant past? No   Is the patient a risk to others? Yes   Has the patient been a risk to others in the past 6 months? No   Has the patient been a risk to others within the distant past? No   Past Medical History:  Past Medical History:  Diagnosis Date  . ADD (attention deficit disorder)   . Asthma   . Depressed   . Schizoaffective disorder (HCC)    No past surgical history on file.  Family History: No family history on file.  Social History:  Social History   Socioeconomic History  . Marital status: Single    Spouse name: Not on file  . Number of children: Not on file  . Years of education: Not on file  . Highest education level: Not on file  Occupational History  . Occupation: Unemployed  Tobacco Use  . Smoking status: Current Some Day Smoker    Years: 0.00    Types: Cigarettes  . Smokeless tobacco: Never Used  Vaping Use  . Vaping Use: Never used  Substance and Sexual Activity  . Alcohol use: Not Currently  . Drug use: Yes    Frequency: 2.0 times per week    Types: Marijuana    Comment: UTA recent use  . Sexual activity: Never  Other Topics Concern  . Not on file  Social History Narrative   Pt is unemployed; lives with grandmother   Social Determinants of Health   Financial Resource Strain: Not on file  Food Insecurity: Not on file  Transportation Needs: Not on file  Physical Activity: Not on file  Stress: Not on file  Social Connections: Not on file  Intimate Partner Violence: Not on file    SDOH:  SDOH Screenings   Alcohol Screen: Not on file  Depression (ZOX0-9): Not on file   Financial Resource Strain: Not on file  Food Insecurity: Not on file  Housing: Not on file  Physical Activity: Not on file  Social Connections: Not on file  Stress: Not on file  Tobacco Use: High Risk  . Smoking Tobacco Use: Current Some Day Smoker  . Smokeless Tobacco Use: Never Used  Transportation Needs: Not on file    Last Labs:  Admission on 12/16/2020, Discharged on 12/17/2020  Component Date Value Ref Range Status  . SARS Coronavirus 2 by RT PCR 12/16/2020 NEGATIVE  NEGATIVE Final   Comment: (NOTE) SARS-CoV-2 target nucleic acids are NOT DETECTED.  The SARS-CoV-2 RNA is generally detectable in upper respiratory specimens during the acute phase of infection. The lowest concentration of SARS-CoV-2 viral copies this assay can detect is 138 copies/mL. A negative result does not preclude SARS-Cov-2 infection and should not be used as the sole basis for treatment or other patient  management decisions. A negative result may occur with  improper specimen collection/handling, submission of specimen other than nasopharyngeal swab, presence of viral mutation(s) within the areas targeted by this assay, and inadequate number of viral copies(<138 copies/mL). A negative result must be combined with clinical observations, patient history, and epidemiological information. The expected result is Negative.  Fact Sheet for Patients:  BloggerCourse.comhttps://www.fda.gov/media/152166/download  Fact Sheet for Healthcare Providers:  SeriousBroker.ithttps://www.fda.gov/media/152162/download  This test is no                          t yet approved or cleared by the [place] FDA and  has been authorized for detection and/or diagnosis of SARS-CoV-2 by FDA under an Emergency Use Authorization (EUA). This EUA will remain  in effect (meaning this test can be used) for the duration of the COVID-19 declaration under Section 564(b)(1) of the Act, 21 U.S.C.section 360bbb-3(b)(1), unless the authorization is terminated  or  revoked sooner.      . Influenza A by PCR 12/16/2020 NEGATIVE  NEGATIVE Final  . Influenza B by PCR 12/16/2020 NEGATIVE  NEGATIVE Final   Comment: (NOTE) The Xpert Xpress SARS-CoV-2/FLU/RSV plus assay is intended as an aid in the diagnosis of influenza from Nasopharyngeal swab specimens and should not be used as a sole basis for treatment. Nasal washings and aspirates are unacceptable for Xpert Xpress SARS-CoV-2/FLU/RSV testing.  Fact Sheet for Patients: BloggerCourse.comhttps://www.fda.gov/media/152166/download  Fact Sheet for Healthcare Providers: SeriousBroker.ithttps://www.fda.gov/media/152162/download  This test is not yet approved or cleared by the [place] FDA and has been authorized for detection and/or diagnosis of SARS-CoV-2 by FDA under an Emergency Use Authorization (EUA). This EUA will remain in effect (meaning this test can be used) for the duration of the COVID-19 declaration under Section 564(b)(1) of the Act, 21 U.S.C. section 360bbb-3(b)(1), unless the authorization is terminated or revoked.  Performed at [facility], [address] [place], [place] [address]   . Sodium 12/16/2020 137  135 - 145 mmol/L Final  . Potassium 12/16/2020 3.9  3.5 - 5.1 mmol/L Final  . Chloride 12/16/2020 105  98 - 111 mmol/L Final  . CO2 12/16/2020 25  22 - 32 mmol/L Final  . Glucose, Bld 12/16/2020 100* 70 - 99 mg/dL Final   Glucose reference range applies only to samples taken after fasting for at least 8 hours.  . BUN 12/16/2020 9  6 - 20 mg/dL Final  . Creatinine, Ser 12/16/2020 1.06  0.61 - 1.24 mg/dL Final  . Calcium 60/45/409804/29/2022 9.2  8.9 - 10.3 mg/dL Final  . Total Protein 12/16/2020 7.8  6.5 - 8.1 g/dL Final  . Albumin 11/91/478204/29/2022 4.6  3.5 - 5.0 g/dL Final  . AST 95/62/130804/29/2022 24  15 - 41 U/L Final  . ALT 12/16/2020 18  0 - 44 U/L Final  . Alkaline Phosphatase 12/16/2020 75  38 - 126 U/L Final  . Total Bilirubin 12/16/2020 0.7  0.3 - 1.2 mg/dL Final  . GFR, Estimated 12/16/2020 >60   >60 mL/min Final   Comment: (NOTE) Calculated using the CKD-EPI Creatinine Equation (2021)   . Anion gap 12/16/2020 7  5 - 15 Final   Performed at [facility], [address] [place], [place] [address]  . Alcohol, Ethyl (B) 12/16/2020 <10  <10 mg/dL Final   Comment: (NOTE) Lowest detectable limit for serum alcohol is 10 mg/dL.  For medical purposes only. Performed at [facility], [address]  [place], [place] [address]   . Opiates 12/16/2020 NONE DETECTED  NONE DETECTED Final  . Cocaine 12/16/2020 NONE DETECTED  NONE DETECTED Final  . Benzodiazepines 12/16/2020 NONE DETECTED  NONE DETECTED Final  . Amphetamines 12/16/2020 NONE DETECTED  NONE DETECTED Final  . Tetrahydrocannabinol 12/16/2020 NONE DETECTED  NONE DETECTED Final  . Barbiturates 12/16/2020 NONE DETECTED  NONE DETECTED Final   Comment: (NOTE) DRUG SCREEN FOR MEDICAL PURPOSES ONLY.  IF CONFIRMATION IS NEEDED FOR ANY PURPOSE, NOTIFY LAB WITHIN 5 DAYS.  LOWEST DETECTABLE LIMITS FOR URINE DRUG SCREEN Drug Class                     Cutoff (ng/mL) Amphetamine and metabolites    1000 Barbiturate and metabolites    200 Benzodiazepine                 200 Tricyclics and metabolites     300 Opiates and metabolites        300 Cocaine and metabolites        300 THC                            50 Performed at [facility], [address], [place], [place] [address]   . WBC 12/16/2020 9.2  4.0 - 10.5 K/uL Final  . RBC 12/16/2020 5.45  4.22 - 5.81 MIL/uL Final  . Hemoglobin 12/16/2020 16.4  13.0 - 17.0 g/dL Final  . HCT 06/13/8526 49.3  39.0 - 52.0 % Final  . MCV 12/16/2020 90.5  80.0 - 100.0 fL Final  . MCH 12/16/2020 30.1  26.0 - 34.0 pg Final  . MCHC 12/16/2020 33.3  30.0 - 36.0 g/dL Final  . RDW 78/24/2353 12.7  11.5 - 15.5 % Final  . Platelets 12/16/2020 191  150 - 400 K/uL Final  . nRBC 12/16/2020 0.0  0.0 - 0.2 % Final  . Neutrophils Relative %  12/16/2020 76  % Final  . Neutro Abs 12/16/2020 7.0  1.7 - 7.7 K/uL Final  . Lymphocytes Relative 12/16/2020 15  % Final  . Lymphs Abs 12/16/2020 1.4  0.7 - 4.0 K/uL Final  . Monocytes Relative 12/16/2020 4  % Final  . Monocytes Absolute 12/16/2020 0.4  0.1 - 1.0 K/uL Final  . Eosinophils Relative 12/16/2020 4  % Final  . Eosinophils Absolute 12/16/2020 0.4  0.0 - 0.5 K/uL Final  . Basophils Relative 12/16/2020 1  % Final  . Basophils Absolute 12/16/2020 0.1  0.0 - 0.1 K/uL Final  . Immature Granulocytes 12/16/2020 0  % Final  . Abs Immature Granulocytes 12/16/2020 0.04  0.00 - 0.07 K/uL Final   Performed at [facility], [address] [place], [place] [address]  . Salicylate Lvl 12/16/2020 <7.0* 7.0 - 30.0 mg/dL Final   Performed at [facility], [address], [place], [place] [address]  . Acetaminophen (Tylenol), Serum 12/16/2020 <10* 10 - 30 ug/mL Final   Comment: (NOTE) Therapeutic concentrations vary significantly. A range of 10-30 ug/mL  may be an effective concentration for many patients. However, some  are best treated at concentrations outside of this range. Acetaminophen concentrations >150 ug/mL at 4 hours after ingestion  and >50 ug/mL at 12 hours after ingestion are often associated with  toxic reactions.  Performed at [facility], [address] [place], [place] [address]     Allergies: Bee venom, Apple, Corn-containing products, Lac  bovis, Other, and Peanut butter flavor  PTA Medications: (Not in a hospital admission)   Medical Decision Making  Patient is a 25 year old male with history of schizoaffective disorder, bipolar type who presents to the behavioral urgent care as a voluntary direct admit transfer from [facility] long emergency department.  Patient is minimally cooperative on exam.  Patient denies SI, HI, AVH, paranoia, or delusions and does not appear to be psychotic on exam.  However, due to  patient's statements mentioned earlier on TTS assessment regarding wanting to hurt others as well as patient refusing for collateral information to be obtained from his grandmother or anybody else upon TTS assessment, believe that the patient may be a potential threat to others at this time and thus recommend continuous observation/assessment for the patient.    Recommendations  Based on my evaluation the patient does not appear to have an emergency medical condition.  Patient was medically cleared in the emergency department. Patient will be placed in [facility] continuous observation/assessment for further stabilization and treatment.  Patient will be reevaluated by the treatment team on 12/17/2020 and disposition to be determined at that time. 12/16/20 ED Labs reviewed:   -PCR COVID: Negative  -CMP: Unremarkable  -Ethanol, Tylenol, and salicylate levels: Within normal limits  -CBC with differential: Within normal limits  -UDS: Negative for all substances Patient had antipsychotic labs (prolactin, TSH, lipid panel, and hemoglobin A1c) checked within this past year on 11/14/2019.  11/14/2019 TSH, lipid panel, and hemoglobin A1c values within normal limits and thus will not reorder these labs at this time.  11/14/2019 prolactin level elevated at 43.1 ng/mL. Will re-check Prolactin level (Lab added onto previous collection from ED labs).   Patient had an EKG done in the ED on 12/16/2020, which showed no acute/concerning findings with QTC of 440 ms (appropriate QTC to resume antipsychotic medications).  Thus, no EKG needed at this time.  Patient not currently taking any psychotropic medications.  Discussed reinitiating some of the patient's past medications, including Risperdal.  Patient declined to reinitiate any scheduled psychotropic medications, stating that he does not want to take scheduled medications and does not believe in medications.  Thus, no scheduled psychotropic medications ordered at this  time.  Vistaril 25 mg p.o. 3 times daily as needed ordered for anxiety Trazodone and Tylenol not ordered due to patient's documented allergy to corn containing products.  Jaclyn Shaggy, PA-C 12/17/20  5:29 AM

## 2020-12-17 NOTE — ED Notes (Signed)
Pt admitted to continuous assessment due to manic behavior. Pt A&O x4, calm and cooperative. Pt tolerated skin assessment well. Pt ambulated independently to unit. Oriented to unit/staff. No signs of acute distress noted. Will continue to monitor for safety.

## 2020-12-17 NOTE — ED Provider Notes (Signed)
FBC/OBS ASAP Discharge Summary  Date and Time: 12/17/2020 12:27 PM  Name: Lawrence [name]  MRN:  [id number]   Discharge Diagnoses:  Final diagnoses:  Schizoaffective disorder, bipolar type (HCC)  Intermittent explosive disorder    Subjective: "My mental health is stable and this should have nothing to do with my mental health." Lawrence [name], 25 y.o., male patient seen face to face by this provider, consulted with Dr. Jannifer FranklinAkintayo; and chart reviewed on 12/17/20.  On evaluation Lawrence [name] reports he and his grandmother got into an argument related to someone coming to the house.  "She had let them in before and then got mad at me when they came back and then she wanted to blame it on schizoaffective disorder.  I did get upset and just came in her voluntarily, but my mental health is stable and this should have never had anything to do with my mental health."  Patient states he finished his therapy sessions at [facility] about 2 months ago.  States his therapist agreed that he didn't need medications since he was doing well.  Patient states he has been stable for a while and doesn't want to be started on any medications.  Patient states he will be able to return to his grandmothers house once discharged.  Patient gave permission to speak to his mother for collateral information.     During evaluation Lawrence [name] is sitting up right in chair in no acute distress.  He is alert, oriented x 4, calm and cooperative.  His mood is euthymic with congruent affect.  He does not appear to be responding to internal/external stimuli or delusional thoughts.  Patient denies suicidal/self-harm/homicidal ideation, psychosis, and paranoia.  Patient answered question appropriately.  Collateral Information:  Spoke to patients mother Olevia Perchesinika Clark at [phone].  Ms. Chestine SporeClark states that patient did have an argument with his grandmother whom he lives with.  States he has been off of medications but has been doing  well and has no concerns for patients safety.  States that patient will be able to return to grandmothers house and she will pick up today to take him there.  She is aware that patient is no longer receiving therapy; Discussed patient option to return to [facility] for services or to [facility] for outpatient psychiatric services if needed.    Stay Summary: Lawrence [name] was admitted to [facility] Continuous Assessment unit for Schizoaffective disorder, bipolar type [facility](HCC) and crisis management.  No medications were started during this stay.  Patient stating he doesn't need psychotropic medications and hasn't taken any in a while.  Stated he was doing fine without medications. Lawrence [name]'s improvement was monitored by continuous assessment/observation and his report of symptom reduction.  His emotional and mental status was also monitored by staff.           Lawrence [name] was evaluated for stability and plans for continued recovery upon discharge.  Lawrence [name] motivation was an integral factor for scheduling further treatment.  The following was addressed as part of his discharge planning and follow up treatment:  Employment, housing, transportation, health status, family support, and any pending legal issues were also considered during his during the continuous assessment/observation stay.  He was offered further treatment options upon discharge and referral/resources given.  Lawrence [name] will follow up with the services as listed below under Follow up Information.    Upon completion of this admission the Lawrence MinusCameron Ciesla was both mentally and medically stable for discharge denying suicidal/homicidal  ideation, auditory/visual/tactile hallucinations, delusional thoughts and paranoia.      Total Time spent with patient: 45 minutes  Past Psychiatric History: Schizoaffective disorder bipolar type Past Medical History:  Past Medical History:  Diagnosis Date  . ADD (attention deficit disorder)    . Asthma   . Depressed   . Schizoaffective disorder (HCC)    History reviewed. No pertinent surgical history. Family History: History reviewed. No pertinent family history. Family Psychiatric History: Unaware Social History:  Social History   Substance and Sexual Activity  Alcohol Use Not Currently     Social History   Substance and Sexual Activity  Drug Use Yes  . Frequency: 2.0 times per week  . Types: Marijuana   Comment: UTA recent use    Social History   Socioeconomic History  . Marital status: Single    Spouse name: Not on file  . Number of children: Not on file  . Years of education: Not on file  . Highest education level: Not on file  Occupational History  . Occupation: Unemployed  Tobacco Use  . Smoking status: Current Some Day Smoker    Years: 0.00    Types: Cigarettes  . Smokeless tobacco: Never Used  Vaping Use  . Vaping Use: Never used  Substance and Sexual Activity  . Alcohol use: Not Currently  . Drug use: Yes    Frequency: 2.0 times per week    Types: Marijuana    Comment: UTA recent use  . Sexual activity: Never  Other Topics Concern  . Not on file  Social History Narrative   Pt is unemployed; lives with grandmother   Social Determinants of Health   Financial Resource Strain: Not on file  Food Insecurity: Not on file  Transportation Needs: Not on file  Physical Activity: Not on file  Stress: Not on file  Social Connections: Not on file   SDOH:  SDOH Screenings   Alcohol Screen: Not on file  Depression (LDJ5-7): Not on file  Financial Resource Strain: Not on file  Food Insecurity: Not on file  Housing: Not on file  Physical Activity: Not on file  Social Connections: Not on file  Stress: Not on file  Tobacco Use: High Risk  . Smoking Tobacco Use: Current Some Day Smoker  . Smokeless Tobacco Use: Never Used  Transportation Needs: Not on file    Has this patient used any form of tobacco in the last 30 days? (Cigarettes,  Smokeless Tobacco, Cigars, and/or Pipes) A prescription for an FDA-approved tobacco cessation medication was offered at discharge and the patient refused  Current Medications:  Current Facility-Administered Medications  Medication Dose Route Frequency Provider Last Rate Last Admin  . alum & mag hydroxide-simeth (MAALOX/MYLANTA) 200-200-20 MG/5ML suspension 30 mL  30 mL Oral Q4H PRN Melbourne Abts W, PA-C      . hydrOXYzine (ATARAX/VISTARIL) tablet 25 mg  25 mg Oral TID PRN Jaclyn Shaggy, PA-C      . magnesium hydroxide (MILK OF MAGNESIA) suspension 30 mL  30 mL Oral Daily PRN Jaclyn Shaggy, PA-C       Current Outpatient Medications  Medication Sig Dispense Refill  . benztropine (COGENTIN) 1 MG tablet Take 1 tablet (1 mg total) by mouth 2 (two) times daily. (Patient not taking: Reported on 12/16/2020) 60 tablet 2  . carbamazepine (TEGRETOL-XR) 200 MG 12 hr tablet 1 in am  2 at hs (Patient not taking: Reported on 12/16/2020) 60 tablet 2  . paliperidone (INVEGA SUSTENNA) 234 MG/1.5ML  SUSY injection Inject 234 mg into the muscle once for 1 dose. Due 4/28 (Patient not taking: Reported on 12/16/2020) 1.5 mL 11  . risperiDONE (RISPERDAL) 3 MG tablet Take 1 tablet (3 mg total) by mouth 2 (two) times daily. (Patient not taking: Reported on 12/16/2020) 60 tablet 2    PTA Medications: (Not in a hospital admission)   Musculoskeletal  Strength & Muscle Tone: within normal limits Gait & Station: normal Patient leans: N/A  Psychiatric Specialty Exam  Presentation  General Appearance: Appropriate for Environment; Casual  Eye Contact:Good  Speech:Clear and Coherent; Normal Rate  Speech Volume:Normal  Handedness:Right   Mood and Affect  Mood:Euthymic  Affect:Appropriate; Congruent   Thought Process  Thought Processes:Coherent; Goal Directed  Descriptions of Associations:Intact  Orientation:Full (Time, Place and Person)  Thought Content:WDL  Diagnosis of Schizophrenia or Schizoaffective  disorder in past: Yes  Duration of Psychotic Symptoms: Greater than six months   Hallucinations:Hallucinations: None  Ideas of Reference:None  Suicidal Thoughts:Suicidal Thoughts: No  Homicidal Thoughts:Homicidal Thoughts: No   Sensorium  Memory:Immediate Good; Recent Good  Judgment:Intact  Insight:Present   Executive Functions  Concentration:Fair  Attention Span:Fair  Recall:Good  Fund of Knowledge:Fair  Language:Good   Psychomotor Activity  Psychomotor Activity:Psychomotor Activity: Normal   Assets  Assets:Communication Skills; Desire for Improvement; Housing; Leisure Time; Resilience; Social Support   Sleep  Sleep:Sleep: Good   Nutritional Assessment (For [facility] and [facility] admissions only) Has the patient had a weight loss or gain of 10 pounds or more in the last 3 months?: No Has the patient had a decrease in food intake/or appetite?: No Does the patient have dental problems?: No Does the patient have eating habits or behaviors that may be indicators of an eating disorder including binging or inducing vomiting?: No Has the patient recently lost weight without trying?: No Has the patient been eating poorly because of a decreased appetite?: No Malnutrition Screening Tool Score: 0    Physical Exam  Physical Exam Vitals and nursing note reviewed. Exam conducted with a chaperone present.  Constitutional:      General: He is not in acute distress.    Appearance: Normal appearance. He is not ill-appearing.  HENT:     Head: Normocephalic.  Cardiovascular:     Rate and Rhythm: Normal rate.  Pulmonary:     Effort: Pulmonary effort is normal.  Musculoskeletal:        General: Normal range of motion.     Cervical back: Normal range of motion.  Skin:    General: Skin is warm and dry.  Neurological:     General: No focal deficit present.     Mental Status: He is alert and oriented to person, place, and time.  Psychiatric:        Attention and Perception:  Attention and perception normal. He does not perceive auditory or visual hallucinations.        Mood and Affect: Mood and affect normal.        Speech: Speech normal.        Behavior: Behavior normal. Behavior is cooperative.        Thought Content: Thought content normal. Thought content is not paranoid or delusional. Thought content does not include homicidal or suicidal ideation.        Cognition and Memory: Cognition and memory normal.        Judgment: Judgment is impulsive.    Review of Systems  Constitutional: Negative.   HENT: Negative.   Eyes: Negative.  Respiratory: Negative.   Cardiovascular: Negative.   Gastrointestinal: Negative.   Genitourinary: Negative.   Musculoskeletal: Negative.   Skin: Negative.   Neurological: Negative.   Endo/Heme/Allergies: Negative.   Psychiatric/Behavioral: Negative for memory loss. Depression: Stable. Hallucinations: Denies. Substance abuse: Denies. Suicidal ideas: Denies. The patient has insomnia. Nervous/anxious: stable.        Patient reporting he was sent here after getting into an argument with his grandmother about a person in the house she didn't want there.  "She was the one who let the person in the first time and now getting mad at me when they come back and it has nothing to do with my mental health which I feel is pretty stable now."    Blood pressure 119/81, pulse 69, temperature 98.6 F (37 C), temperature source Oral, resp. rate 18, SpO2 100 %. There is no height or weight on file to calculate BMI.  Demographic Factors:  Male, Low socioeconomic status and Unemployed  Loss Factors: NA  Historical Factors: Impulsivity  Risk Reduction Factors:   Sense of responsibility to family, Religious beliefs about death, Living with another person, especially a relative, Positive social support and Positive therapeutic relationship  Continued Clinical Symptoms:  Previous Psychiatric Diagnoses and Treatments  Cognitive Features That  Contribute To Risk:  None    Suicide Risk:  Minimal: No identifiable suicidal ideation.  Patients presenting with no risk factors but with morbid ruminations; may be classified as minimal risk based on the severity of the depressive symptoms  Plan Of Care/Follow-up recommendations:  Activity:  As tolerated Diet:  Heart healthy  Disposition: No evidence of imminent risk to self or others at present.   Patient does not meet criteria for psychiatric inpatient admission. Supportive therapy provided about ongoing stressors. Refer to IOP. Discussed crisis plan, support from social network, calling 911, coming to the Emergency Department, and calling Suicide Hotline.    Follow-up Information    Go to  [facility].   Specialty: Urgent Care Why: Open Access:  Monday - Thursday from 8 am to 11 am for medication management and therapy intake.  On Friday from 1 pm to 4 pm for therapy intake only.  Need to come early.  First come first serve.  At least 30 minutes prior to start of services. Contact information: [address] [place] [address] [phone]              Assunta Found, NP 12/17/2020, 12:27 PM

## 2020-12-17 NOTE — BH Assessment (Addendum)
Comprehensive Clinical Assessment (CCA) Note  12/17/2020 Lawrence [name] [id number]  Chief Complaint:  Chief Complaint  Patient presents with  . Mental Health Problem   Visit Diagnosis:  Per Chart Schizoaffective disorder, bipolar type  Flowsheet Row ED from 12/16/2020 in [facility] Admission (Discharged) from OP [facility] ([facility]) [facility]  C-SSRS RISK CATEGORY No Risk No Risk Error: Question 6 not populated     The patient demonstrates the following risk factors for suicide: Chronic risk factors for suicide include: psychiatric disorder of schizoaffective disorder, bipolar type; history of negative thinking and wanting to hurt family members. Acute risk factors for suicide include: family or marital conflict, social withdrawal/isolation and loss (financial, interpersonal, professional). Protective factors for this patient include: positive social support, positive therapeutic relationship, coping skills and hope for the future. Considering these factors, the overall suicide risk at this point appears to be no risk. Patient is appropriate for outpatient follow up.  Disposition Melbourne Abts PA-C, recommends pt to be overnight observation and to be reassessed by psychiatry.  Disposition discussed with Lawrence [name], via secure chat in Epic.  RN to discuss disposition with EDP.  Lawrence [name] is a 25 year old single male who presents voluntarily to [facility] and unaccompanied.  Pt reports that he was BIB his grandmother. Pt did not provide permission for TTS to contact relative for collaterale information.  Pt reports he has a history of schizoaffective and manic disorder.  Pt reports that he have been feeling increasingly manic for the past week.  Pt states "I have been receiving emotional abuse from other person, which cause me to go into a manic  episode".  Pt reports the people are old and they don't understand me, and they say negative things to me.  Pt denies SI and no prior attempt of suicide  attempt.    Pt denies any history of intentional self injurious behaviors.  Pt admitted that he thinks about hurting his family, "I just have the thoughts, no intention, they just make me angry".  Pt reports no change in his sleep pattern; also reports that he is eating three meals during the day.   Pt denies paranoia.  Pt denies any history of auditory or visual hallucinations.  Pt says he have not drink alcohol or use any other substance use. Pt reports that he smoke cigarettes daily.  Pt identified his  primary stressors as negative talk from my grandmother; also, not receiving the supported that need.  Pt reports that he lives with both his mother and grandmother, which can be overwhelming at times ' my grandmother is negative'.  Pt reports that he is currently unemployed.  Pt reports that his mother was diagnosed with PTSD, and denies history of substance use in his family.  Pt denies any history of abuse or trauma.  Pt denies any current legal problems.  Pt reports no guns or weapons in the family.  Pt says he is not currently seeing his therapist, [name] with [employer] in four months.  Pt states that he does not take any prescribed medication " I don't believe in it".  Pt reports no previous inpatient psychiatric hospitalization.  Pt is dressed in scrubs, alert, oriented x 4 with normal speech and restless motor behavior.  Eye contact is normal and Pt is tearful.  Pt mood is dysphoric and affect is anxious.  Thought process is  relevant.  Pt's insight good and judgement is good.  There is no indication Pt is currently responding to internal stimuli or experiencing delusional thought content.  Pt was cooperative throughout assessment.        CCA Screening, Triage and Referral (STR)  Patient Reported Information How did you hear about [place]?  Family/Friend  Referral name: Self  Referral phone number: No data recorded  Whom do you see for routine medical problems? I don't have a doctor; Hospital ER  Practice/Facility Name: ED  Practice/Facility Phone Number: No data recorded Name of Contact: No data recorded Contact Number: No data recorded Contact Fax Number: No data recorded Prescriber Name: No data recorded Prescriber Address (if known): No data recorded  What Is the Reason for Your Visit/Call Today? HI Thoughts  How Long Has This Been Causing You Problems? <Week  What Do You Feel Would Help You the Most Today? Treatment for Depression or other mood problem   Have You Recently Been in Any Inpatient Treatment (Hospital/Detox/Crisis Center/28-Day Program)? No  Name/Location of Program/Hospital:No data recorded How Long Were You There? No data recorded When Were You Discharged? No data recorded  Have You Ever Received Services From [facility] Before? Yes  Who Do You See at [facility]? November 11, 2019 - November 16, 2019   Have You Recently Had Any Thoughts About Hurting Yourself? No  Are You Planning to Commit Suicide/Harm Yourself At This time? No   Have you Recently Had Thoughts About Hurting Someone [name]? Yes  Explanation: No data recorded  Have You Used Any Alcohol or Drugs in the Past 24 Hours? No  How Long Ago Did You Use Drugs or Alcohol? No data recorded What Did You Use and How Much? No data recorded  Do You Currently Have a Therapist/Psychiatrist? Yes  Name of Therapist/Psychiatrist: [name] (no last name), [facility], [phone]   Have You Been Recently Discharged From Any Office Practice or Programs? No  Explanation of Discharge From Practice/Program: No data recorded    CCA Screening Triage Referral Assessment Type of Contact: Tele-Assessment  Is this Initial or Reassessment? Initial Assessment  Date Telepsych consult ordered in [facility]:  12/16/2020  Time Telepsych consult  ordered in [facility]:  No data recorded  Patient Reported Information Reviewed? No data recorded Patient Left Without Being Seen? No data recorded Reason for Not Completing Assessment: No data recorded  Collateral Involvement: NO collateral reported.   Does Patient Have a Automotive engineer Guardian? No data recorded Name and Contact of Legal Guardian: No data recorded If Minor and Not Living with Parent(s), Who has Custody? No data recorded Is CPS involved or ever been involved? Never  Is APS involved or ever been involved? Never   Patient Determined To Be At Risk for Harm To Self or Others Based on Review of Patient Reported Information or Presenting Complaint? Yes, for Harm to Others  Method: No Plan (Pt reports thoughts of hurting, unable to report how he would carry out the plan.)  Availability of Means: No access or NA (Pt reports no access to guns or weapons.)  Intent: Vague intent or NA  Notification Required: No data recorded Additional Information for Danger to Others Potential: No data recorded Additional Comments for Danger to Others Potential: Pt reports that he volunteer to come into hospital, was brought by grandmother.  Are There Guns or Other Weapons in Your Home? No  Types of Guns/Weapons: No data recorded Are These Weapons Safely Secured?  No data recorded Who Could Verify You Are Able To Have These Secured: No data recorded Do You Have any Outstanding Charges, Pending Court Dates, Parole/Probation? none  Contacted To Inform of Risk of Harm To Self or Others: No data recorded  Location of Assessment: No data recorded  Does Patient Present under Involuntary Commitment? No data recorded IVC Papers Initial File Date: No data recorded  [place] of Residence: No data recorded  Patient Currently Receiving the Following Services: No data recorded  Determination of Need: No data recorded  Options For Referral: No data  recorded    CCA Biopsychosocial Intake/Chief Complaint:  Depression  Current Symptoms/Problems: isolation, fatigue, crying, irritable, sad   Patient Reported Schizophrenia/Schizoaffective Diagnosis in Past: Yes   Strengths: communicate his feelings  Preferences: UTA  Abilities: UTA   Type of Services Patient Feels are Needed: Group Therapy   Initial Clinical Notes/Concerns: HI   Mental Health Symptoms Depression:  Change in energy/activity; Difficulty Concentrating; Fatigue; Irritability   Duration of Depressive symptoms: No data recorded  Mania:  Change in energy/activity; Irritability; Racing thoughts; Recklessness   Anxiety:   Irritability; Restlessness; Worrying   Psychosis:  Other negative symptoms (Pt reports he receives negative talk from his grandmother daily.)   Duration of Psychotic symptoms: Less than six months   Trauma:  None   Obsessions:  None   Compulsions:  None   Inattention:  None   Hyperactivity/Impulsivity:  N/A   Oppositional/Defiant Behaviors:  None   Emotional Irregularity:  Chronic feelings of emptiness; Intense/inappropriate anger; Intense/unstable relationships   Other Mood/Personality Symptoms:  Depressed/Irritable mood    Mental Status Exam Appearance and self-care  Stature:  Average   Weight:  Average weight   Clothing:  -- (Pt dressed in scrubs)   Grooming:  No data recorded  Cosmetic use:  Age appropriate   Posture/gait:  Normal   Motor activity:  Agitated; Restless   Sensorium  Attention:  Normal   Concentration:  Normal   Orientation:  Object; Person; Place; Situation   Recall/memory:  Normal   Affect and Mood  Affect:  Depressed; Negative   Mood:  Depressed; Dysphoric   Relating  Eye contact:  Normal   Facial expression:  Angry   Attitude toward examiner:  Cooperative   Thought and Language  Speech flow: Clear and Coherent   Thought content:  Personalizations   Preoccupation:  None    Hallucinations:  None   Organization:  No data recorded  [employer] of Knowledge:  Average   Intelligence:  No data recorded  Abstraction:  Functional   Judgement:  Good   Reality Testing:  Adequate   Insight:  Good   Decision Making:  Normal   Social Functioning  Social Maturity:  Isolates   Social Judgement:  Victimized   Stress  Stressors:  Relationship; Housing   Coping Ability:  Overwhelmed   Skill Deficits:  Interpersonal; Self-care; Self-control; Decision making   Supports:  Support needed     Religion: Religion/Spirituality How Might This Affect Treatment?: UTA  Leisure/Recreation: Leisure / Recreation Do You Have Hobbies?:  (UTA)  Exercise/Diet: Exercise/Diet Do You Exercise?:  (UTA) Do You Follow a Special Diet?: No Do You Have Any Trouble Sleeping?: No   CCA Employment/Education Employment/Work Situation:    Education:     CCA Family/Childhood History Family and Relationship History:    Childhood History:     Child/Adolescent Assessment:     CCA Substance Use Alcohol/Drug Use:  ASAM's:  Six Dimensions of Multidimensional Assessment  Dimension 1:  Acute Intoxication and/or Withdrawal Potential:      Dimension 2:  Biomedical Conditions and Complications:      Dimension 3:  Emotional, Behavioral, or Cognitive Conditions and Complications:     Dimension 4:  Readiness to Change:     Dimension 5:  Relapse, Continued use, or Continued Problem Potential:     Dimension 6:  Recovery/Living Environment:     ASAM Severity Score:    ASAM Recommended Level of Treatment:     Substance use Disorder (SUD)    Recommendations for Services/Supports/Treatments:    DSM5 Diagnoses: Patient Active Problem List   Diagnosis Date Noted  . Schizophrenia (HCC) 11/11/2019  . Schizoaffective disorder, bipolar type (HCC) 01/09/2017  . Intermittent explosive disorder 05/13/2016  .  Intoxication by drug (HCC) 07/12/2014  . Intoxication 07/12/2014  . Depression 07/12/2014  . Overdose of antitussive       Referrals to Alternative Service(s): Referred to Alternative Service(s):   Place:   Date:   Time:    Referred to Alternative Service(s):   Place:   Date:   Time:    Referred to Alternative Service(s):   Place:   Date:   Time:    Referred to Alternative Service(s):   Place:   Date:   Time:     Meryle Readyijuana  Alec Jaros, Counselor

## 2020-12-18 LAB — PROLACTIN: Prolactin: 10.6 ng/mL (ref 4.0–15.2)

## 2021-08-14 ENCOUNTER — Encounter (HOSPITAL_COMMUNITY): Payer: Self-pay | Admitting: Emergency Medicine

## 2021-08-14 ENCOUNTER — Emergency Department (HOSPITAL_COMMUNITY)
Admission: EM | Admit: 2021-08-14 | Discharge: 2021-08-16 | Disposition: A | Discharge status: Home / Self Care | Payer: 59 | Attending: Emergency Medicine | Admitting: Emergency Medicine

## 2021-08-14 ENCOUNTER — Emergency Department (HOSPITAL_COMMUNITY): Payer: 59

## 2021-08-14 DIAGNOSIS — F17210 Nicotine dependence, cigarettes, uncomplicated: Secondary | ICD-10-CM | POA: Insufficient documentation

## 2021-08-14 DIAGNOSIS — R4182 Altered mental status, unspecified: Secondary | ICD-10-CM

## 2021-08-14 DIAGNOSIS — F259 Schizoaffective disorder, unspecified: Secondary | ICD-10-CM

## 2021-08-14 DIAGNOSIS — Y900 Blood alcohol level of less than 20 mg/100 ml: Secondary | ICD-10-CM | POA: Diagnosis not present

## 2021-08-14 DIAGNOSIS — F200 Paranoid schizophrenia: Secondary | ICD-10-CM | POA: Insufficient documentation

## 2021-08-14 DIAGNOSIS — Z20822 Contact with and (suspected) exposure to covid-19: Secondary | ICD-10-CM | POA: Insufficient documentation

## 2021-08-14 DIAGNOSIS — Z91010 Allergy to peanuts: Secondary | ICD-10-CM | POA: Diagnosis not present

## 2021-08-14 DIAGNOSIS — F1910 Other psychoactive substance abuse, uncomplicated: Secondary | ICD-10-CM | POA: Diagnosis not present

## 2021-08-14 DIAGNOSIS — J45909 Unspecified asthma, uncomplicated: Secondary | ICD-10-CM | POA: Diagnosis not present

## 2021-08-14 LAB — RESP PANEL BY RT-PCR (FLU A&B, COVID) ARPGX2
Influenza A by PCR: NEGATIVE
Influenza B by PCR: NEGATIVE
SARS Coronavirus 2 by RT PCR: NEGATIVE

## 2021-08-14 LAB — CBC WITH DIFFERENTIAL/PLATELET
Abs Immature Granulocytes: 0.04 10*3/uL (ref 0.00–0.07)
Basophils Absolute: 0 10*3/uL (ref 0.0–0.1)
Basophils Relative: 0 %
Eosinophils Absolute: 0 10*3/uL (ref 0.0–0.5)
Eosinophils Relative: 0 %
HCT: 43.5 % (ref 39.0–52.0)
Hemoglobin: 14.5 g/dL (ref 13.0–17.0)
Immature Granulocytes: 1 %
Lymphocytes Relative: 20 %
Lymphs Abs: 1.7 10*3/uL (ref 0.7–4.0)
MCH: 30.1 pg (ref 26.0–34.0)
MCHC: 33.3 g/dL (ref 30.0–36.0)
MCV: 90.4 fL (ref 80.0–100.0)
Monocytes Absolute: 0.6 10*3/uL (ref 0.1–1.0)
Monocytes Relative: 7 %
Neutro Abs: 6.2 10*3/uL (ref 1.7–7.7)
Neutrophils Relative %: 72 %
Platelets: 173 10*3/uL (ref 150–400)
RBC: 4.81 MIL/uL (ref 4.22–5.81)
RDW: 13 % (ref 11.5–15.5)
WBC: 8.6 10*3/uL (ref 4.0–10.5)
nRBC: 0 % (ref 0.0–0.2)

## 2021-08-14 LAB — SALICYLATE LEVEL: Salicylate Lvl: <7 mg/dL — ABNORMAL LOW (ref 7.0–30.0)

## 2021-08-14 LAB — COMPREHENSIVE METABOLIC PANEL
ALT: 13 U/L (ref 0–44)
AST: 21 U/L (ref 15–41)
Albumin: 4.5 g/dL (ref 3.5–5.0)
Alkaline Phosphatase: 56 U/L (ref 38–126)
Anion gap: 10 (ref 5–15)
BUN: 8 mg/dL (ref 6–20)
CO2: 25 mmol/L (ref 22–32)
Calcium: 9.4 mg/dL (ref 8.9–10.3)
Chloride: 105 mmol/L (ref 98–111)
Creatinine, Ser: 1.13 mg/dL (ref 0.61–1.24)
GFR, Estimated: >60 mL/min (ref >60)
Glucose, Bld: 82 mg/dL (ref 70–99)
Potassium: 4.2 mmol/L (ref 3.5–5.1)
Sodium: 140 mmol/L (ref 135–145)
Total Bilirubin: 1.7 mg/dL — ABNORMAL HIGH (ref 0.3–1.2)
Total Protein: 6.9 g/dL (ref 6.5–8.1)

## 2021-08-14 LAB — RAPID URINE DRUG SCREEN, HOSP PERFORMED
Amphetamines: NOT DETECTED
Barbiturates: NOT DETECTED
Benzodiazepines: NOT DETECTED
Cocaine: NOT DETECTED
Opiates: NOT DETECTED
Tetrahydrocannabinol: POSITIVE — AB

## 2021-08-14 LAB — ACETAMINOPHEN LEVEL: Acetaminophen (Tylenol), Serum: <10 ug/mL — ABNORMAL LOW (ref 10–30)

## 2021-08-14 LAB — ETHANOL: Alcohol, Ethyl (B): <10 mg/dL (ref <10)

## 2021-08-14 MED ORDER — STERILE WATER FOR INJECTION IJ SOLN
INTRAMUSCULAR | Status: AC
  Filled 2021-08-14: qty 10

## 2021-08-14 MED ORDER — ZIPRASIDONE MESYLATE 20 MG IM SOLR
20.0000 mg | Freq: Once | INTRAMUSCULAR | Status: AC
  Filled 2021-08-14: qty 20

## 2021-08-14 MED ORDER — SODIUM CHLORIDE 0.9 % IV BOLUS
1000.0000 mL | Freq: Once | INTRAVENOUS | Status: AC
  Administered 2021-08-14: 10:00:00 1000 mL via INTRAVENOUS

## 2021-08-14 NOTE — ED Notes (Signed)
Pt sleeping will obtain VS once awake

## 2021-08-14 NOTE — ED Notes (Signed)
Patient transported to CT 

## 2021-08-14 NOTE — ED Notes (Signed)
Pt standing at the door with the door open. Pt asked if he needed anything nad pt shook his head no. Pt also offered to use the bathroom and shook his head no. Pt continued to stand at door until he shook his head no again and walked back in the room gently closing the door

## 2021-08-14 NOTE — ED Notes (Signed)
Pt sitting in bed calmly, eating at this time.

## 2021-08-14 NOTE — ED Triage Notes (Signed)
Pt arrives via EMS from home with reports of marijuana use for the past 2-3 days and possible delta 8 use. Pt not talking at this time. EMS reports pt had a few minutes of clarity en route and mentioned cocaine use.  Hx of schizoaffective disorder.

## 2021-08-14 NOTE — ED Notes (Signed)
RN messaged about TTS, and notified them the the pt has been restless and was just able to fall asleep

## 2021-08-14 NOTE — ED Provider Notes (Signed)
[facility] EMERGENCY DEPARTMENT Provider Note   CSN: [id number] Arrival date & time: 08/14/21  0071     History Chief Complaint  Patient presents with   Altered Mental Status    Lawrence [name] is a 25 y.o. male.  Level 5 caveat due to altered mental status. Schizoaffective disorder but not on medication.  EMS states that he might have mentioned using cocaine as well in addition to marijuana.  Grandmother on the phone states that the last 2 weeks he has been smoking marijuana after not using any marijuana for the last few weeks but she is not sure of exactly what he is taking.  He has been talking to himself and having maybe hallucinations.  The history is provided by the EMS personnel.  Altered Mental Status Presenting symptoms: partial responsiveness   Severity:  Moderate Most recent episode:  Yesterday Timing:  Constant Chronicity:  New Context: drug use       Past Medical History:  Diagnosis Date   ADD (attention deficit disorder)    Asthma    Depressed    Schizoaffective disorder (HCC)     Patient Active Problem List   Diagnosis Date Noted   Schizophrenia (HCC) 11/11/2019   Schizoaffective disorder, bipolar type (HCC) 01/09/2017   Intermittent explosive disorder 05/13/2016   Intoxication by drug (HCC) 07/12/2014   Intoxication 07/12/2014   Depression 07/12/2014   Overdose of antitussive     History reviewed. No pertinent surgical history.     No family history on file.  Social History   Tobacco Use   Smoking status: Some Days    Years: 0.00    Types: Cigarettes   Smokeless tobacco: Never  Vaping Use   Vaping Use: Never used  Substance Use Topics   Alcohol use: Not Currently   Drug use: Yes    Frequency: 2.0 times per week    Types: Marijuana    Comment: UTA recent use    Home Medications Prior to Admission medications   Not on File    Allergies    Bee venom, Apple, Corn-containing products, Lac bovis, Other, and  Peanut butter flavor  Review of Systems   Review of Systems  Unable to perform ROS: Mental status change   Physical Exam Updated Vital Signs BP (!) 145/78    Pulse 95    Temp 98.6 F (37 C) (Oral)    Resp (!) 22    Ht 6\' 1"  (1.854 m)    SpO2 100%    BMI 24.41 kg/m   Physical Exam Vitals and nursing note reviewed.  Constitutional:      General: He is not in acute distress.    Appearance: He is well-developed. He is not ill-appearing.  HENT:     Head: Normocephalic and atraumatic.     Nose: Nose normal.     Mouth/Throat:     Mouth: Mucous membranes are moist.  Eyes:     Extraocular Movements: Extraocular movements intact.     Conjunctiva/sclera: Conjunctivae normal.     Pupils: Pupils are equal, round, and reactive to light.  Cardiovascular:     Rate and Rhythm: Normal rate and regular rhythm.     Pulses: Normal pulses.     Heart sounds: No murmur heard. Pulmonary:     Effort: Pulmonary effort is normal. No respiratory distress.     Breath sounds: Normal breath sounds.  Abdominal:     Palpations: Abdomen is soft.     Tenderness: There is  no abdominal tenderness.  Musculoskeletal:        General: No swelling.     Cervical back: Normal range of motion and neck supple.  Skin:    General: Skin is warm and dry.     Capillary Refill: Capillary refill takes less than 2 seconds.  Neurological:     General: No focal deficit present.     Mental Status: He is alert.     Comments: Patient with ability to move all extremities and follows some commands but is slow to respond pupils are equal reactive  Psychiatric:        Attention and Perception: He is inattentive.        Mood and Affect: Affect is flat.        Behavior: Behavior is withdrawn.    ED Results / Procedures / Treatments   Labs (all labs ordered are listed, but only abnormal results are displayed) Labs Reviewed  COMPREHENSIVE METABOLIC PANEL - Abnormal; Notable for the following components:      Result Value    Total Bilirubin 1.7 (*)    All other components within normal limits  SALICYLATE LEVEL - Abnormal; Notable for the following components:   Salicylate Lvl <7.0 (*)    All other components within normal limits  ACETAMINOPHEN LEVEL - Abnormal; Notable for the following components:   Acetaminophen (Tylenol), Serum <10 (*)    All other components within normal limits  RESP PANEL BY RT-PCR (FLU A&B, COVID) [id number]  ETHANOL  CBC WITH DIFFERENTIAL/PLATELET  RAPID URINE DRUG SCREEN, HOSP PERFORMED    EKG EKG Interpretation  Date/Time:  Monday August 14 2021 09:49:11 EST Ventricular Rate:  69 PR Interval:  131 QRS Duration: 105 QT Interval:  417 QTC Calculation: 447 R Axis:   262 Text Interpretation: Normal sinus rhythm Confirmed by Virgina Norfolk ([id number]) on 08/14/2021 10:00:20 AM  Radiology CT HEAD WO CONTRAST ( )  Result Date: 08/14/2021 CLINICAL DATA:  Altered mental status. EXAM: CT HEAD WITHOUT CONTRAST TECHNIQUE: Contiguous axial images were obtained from the base of the skull through the vertex without intravenous contrast. COMPARISON:  11/22/2016 FINDINGS: Brain: There is no evidence for acute hemorrhage, hydrocephalus, mass lesion, or abnormal extra-axial fluid collection. No definite CT evidence for acute infarction. Vascular: No hyperdense vessel or unexpected calcification. Skull: Normal. Negative for fracture or focal lesion. Sinuses/Orbits: No acute finding. Other: None. IMPRESSION: No acute intracranial abnormality. Electronically Signed   By: Kennith Center M.D.   On: 08/14/2021 10:14    Procedures Procedures   Medications Ordered in ED Medications  sodium chloride 0.9 % bolus 1,000 mL (0 mLs Intravenous Stopped 08/14/21 1105)    ED Course  I have reviewed the triage vital signs and the nursing notes.  Pertinent labs & imaging results that were available during my care of the patient were reviewed by me and considered in my medical decision making (see chart for  details).    MDM Rules/Calculators/A&P                          Lawrence [name] is here with altered mental status.  History of schizoaffective disorder not taking his medications.  Suspected polysubstance abuse.  Family concerned that he has been smoking marijuana a lot here the last 2 weeks after not using drugs for a long time.  Patient apparently admitted to cocaine use with EMS.  Patient arrives appeared to be responding to internal stimuli.  He is talking to  himself but not making much sense.  He has normal vitals.  No fever.  He will follow commands but appears to have some decompensated schizophrenia type features.  Lab work shows no significant anemia, electrolyte abnormality, kidney injury.  Head CT is normal.  EKG shows sinus rhythm.  Patient has no leukocytosis.  Vital signs are stable.  IVC has been placed as family is concerned about his mental health and his polysubstance abuse.  We will have him evaluated by psychiatry.  Family states that all the keys smoked any marijuana since yesterday.  Overall he is medically cleared at this time.  This chart was dictated using voice recognition software.  Despite best efforts to proofread,  errors can occur which can change the documentation meaning.       Final Clinical Impression(s) / ED Diagnoses Final diagnoses:  Altered mental status, unspecified altered mental status type  Polysubstance abuse (HCC)  Schizoaffective disorder, unspecified type [facility])    Rx / DC Orders ED Discharge Orders     None        Virgina Norfolk, DO 08/14/21 1129

## 2021-08-14 NOTE — ED Notes (Signed)
Pt belongings placed on the counter in [facility] locker room. All lockers and cabinets were full.

## 2021-08-14 NOTE — ED Notes (Signed)
Pt changed into maroon scrubs. Belongings placed into belongings bag and inventory sheet complete by [place], NT. Pt wanded by security. Pt saying repeatedly, "I need to be in control of my body." Pt does not answer questions directly.

## 2021-08-14 NOTE — BH Assessment (Signed)
TTS clinician attempted to complete assessment. Per [name], RN, patient is asleep, unable to arouse for TTS assessment. TTS clinician will attempt at later time.

## 2021-08-15 NOTE — Progress Notes (Signed)
Patient has been denied by [facility] due to no appropriate beds available. Patient meets BH inpatient criteria per Hillery Jacks, NP. Patient has been faxed out to multiple [facility] facilities.    Damita Dunnings, MSW, LCSW-A  12:00 PM 08/15/2021

## 2021-08-15 NOTE — ED Notes (Signed)
Pt's breakfast tray has arrived. This writer placed his breakfast tray onto his bedside table. Pt seen through his window door, will continue to monitor pt

## 2021-08-15 NOTE — ED Notes (Signed)
Pt standing in doorway of his room. Walked out to nurse station but talked pt into going back into his room and closed the door.  Water given.   Pt opened door back up. Standing in doorway talking to no one in particular.  Cont to monitor for escalation

## 2021-08-15 NOTE — ED Notes (Signed)
Pt opened the door and stood at the front of the door without any noise. This Clinical research associate asked pt if everything was okay, pt nodded. This Clinical research associate then asked pt if he needed to use the bathroom, pt nodded once more. This Clinical research associate had shown pt to the bathroom. Pt had no difficulty walking to the bathroom but did remain standing in front of the mirror, just staring. This writer directed pt to his room and asked if he needed anything. Pt shook his head indicating no. This Clinical research associate told pt that if he needed anything to call out. Pt in a small voice responded "thank you". Pt is now standing in front of the closed door while staring out and making random movements. Will continue to monitor pt

## 2021-08-15 NOTE — ED Notes (Signed)
Pt lying in bed watching TV. sitter present

## 2021-08-15 NOTE — ED Notes (Signed)
Pt began to wake up and seemed to be responding to some internal stimuli. TTS machine placed in room since pt is awake and TTS counselor notified.

## 2021-08-15 NOTE — ED Notes (Signed)
This Clinical research associate checked in on pt to see how he was doing since pt was talking to himself. Pt became silent when this writer entered the room and sat still on the chair in his room. Pt had poor eye contact and remained mostly silent throughout the entire time this writer was in the room. Writer asked pt if he would like to watch some TV, pt nodded. This writer turned it on and put on a movie for pt. This Clinical research associate asked pt if he needed anything but pt simply stood in place and looked down. This Clinical research associate excused herself. Pt sat back down on his chair and talking to himself once more. Will continue to monitor pt

## 2021-08-15 NOTE — ED Notes (Signed)
Lunch ordered 

## 2021-08-15 NOTE — Progress Notes (Signed)
CSW was informed by nursing; Toney Sang, RN that transportation is unable to transport pt this evening.  Pt will be transported in the morning. CSW called [facility] and spoke with [place]. CSW provided the arrival update for pt for tomorrow 08/16/21 after 8am.   Maryjean Ka, MSW, [facility] 08/15/2021 5:57 PM
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
## 2021-08-15 NOTE — ED Notes (Signed)
Pt's lunch has arrived. Pt standing around and talking to himself. This writer left his lunch on the bedside table. Will continue to monitor pt

## 2021-08-15 NOTE — Progress Notes (Signed)
CSW spoke with the patient's mother Tinika via phone regarding the behaviors within the home. Per Tinika, the patient has struggled continuously with managing his mental health treatment independently.Tinika states that the patient is often inconsistently with his medications and outpatient treatment. Tinika describes the patient as being verbally and physically aggressive within the home. Tinika also states that the patient experiences both visual and auditory hallucinations and flight of ideas. Tinika states, "He is often hyper-religious  and believes that he is God." Tinika describes his state as being manic and extreme. Tinika reports that the patient's symptoms worsen over the last two weeks due to him relapsing and smoking marijuana more consistently. The patient has experienced mental illness reportedly since adolescent and it worsen during his senior year in high school.    Damita Dunnings, MSW, LCSW-A  1:31 PM 08/15/2021

## 2021-08-15 NOTE — Progress Notes (Addendum)
Pt accepted to [facility]     Patient meets inpatient criteria per Hillery Jacks, NP   The attending provider will be Roselle Locus, MD    Call report to [phone]  Toney Sang, RN @ [facility] ED notified.     Pt scheduled  to arrive at [facility] TODAY ANYTIME. Please fax IVC paperwork to [phone].     Damita Dunnings, MSW, LCSW-A  3:50 PM 08/15/2021

## 2021-08-15 NOTE — ED Notes (Signed)
Pt walked out of his room twice. Thef irst time, pt walked into nurses station but this writer redirected him to his room. Second time, pt became still and did not move from the middle of the hallway. Once RN from floor came over to help redirect pt, the pt slowly was trying to sit himself down on the floor, this Clinical research associate and RN grabbed pt's arms'. Pt then stood up and walked over with Clinical research associate and RN to his room. Pt tried opening the door and leaving. This Clinical research associate asked pt what was wrong but pt would not speak nor maintain eye contact. Pt now sitting on his bed, making random arm movements and looking at different places. Will continue to monitor pt

## 2021-08-16 MED ORDER — ZIPRASIDONE MESYLATE 20 MG IM SOLR
INTRAMUSCULAR | Status: AC
  Administered 2021-08-16: 04:00:00 20 mg via INTRAMUSCULAR
  Filled 2021-08-16: qty 20

## 2021-08-16 MED ORDER — STERILE WATER FOR INJECTION IJ SOLN
INTRAMUSCULAR | Status: AC
  Administered 2021-08-16: 04:00:00 1 mL
  Filled 2021-08-16: qty 10

## 2021-08-16 NOTE — Progress Notes (Signed)
CSW spoke with the patient's mother via phone and updated her on the patient's acceptance to [facility]. CSW provided the patient's mother with the contact information and address to the facility. The patient's mother denied having any concerns at this time.    Damita Dunnings, MSW, LCSW-A  9:34 AM 08/16/2021

## 2021-08-16 NOTE — ED Notes (Signed)
Notified by [employer] of arrival time 25 minutes.Called and notified RN [facility] of transport by [employer] this morning and of [employer] arrival time. Verified [facility] for bed assignment.

## 2021-08-16 NOTE — ED Notes (Signed)
Pt woke up and walked to the bathroom but before entering, pt made eye contact and asked "may I ask you a question when I get out of the bathroom?". This Clinical research associate told pt "Of course, I will be right out here whenever you are done". Pt used the BR and came back out and asked if he could speak to this writer in the room. This writer brought the pt's breakfast tray and left the door opened. Pt asked "may I speak to you as a human being?". This writer simply nodded, and pt said "I did the whole God thing and I tried to forget but-", pt began tearing up and continued "I want to forget. You know? There are some things that people do that they will forget and I just forgot. DO you understand?". This Clinical research associate empathized with pt since the day before, pt remained mute, no eye contact and did random, weird movements but this morning pt maintained eye contact and spoke clear words. This Clinical research associate said "I understand, and I know you feel overwhelmed with what is happening and we will do our best to help you". Pt asked for a hug and patted this writer's back then sat back down. This Clinical research associate reminded pt that if he needed anything to just ask, door closed and blinds are opened. This Clinical research associate will continue to monitor pt

## 2021-08-16 NOTE — ED Notes (Signed)
Pt jumped out of bed and hurriedly walked out of yellow zone again. Security paged. Pt followed to blue/orange zone where it took much convincing for him to return to his room. Pt walked back to room with security, tech and nurse. Meds given

## 2021-08-16 NOTE — ED Notes (Signed)
Security gone now and pt lying in bed at the moment

## 2021-08-16 NOTE — ED Notes (Signed)
Pt's breakfast tray has arrived. However, pt has finally fallen asleep after an eventful night... this Clinical research associate kept pt's breakfast tray on the nurses station and will heat it up whenever the pt wakes up. RN on floor aware of this. Will continue to monitor pt

## 2021-08-16 NOTE — ED Notes (Signed)
Pt came out of room into hallway. Asked to return to room and pt left out of yellow zone. Security called. Escorted pt back to yellow zone and pt eventually went into his room

## 2021-08-25 ENCOUNTER — Other Ambulatory Visit (HOSPITAL_BASED_OUTPATIENT_CLINIC_OR_DEPARTMENT_OTHER): Payer: Self-pay

## 2021-08-25 MED ORDER — MIRTAZAPINE 15 MG PO TABS
ORAL_TABLET | ORAL | 0 refills | Status: DC
  Filled 2021-08-25: qty 15, 30d supply, fill #0

## 2021-08-25 MED ORDER — OLANZAPINE 10 MG PO TABS
ORAL_TABLET | ORAL | 0 refills | Status: DC
  Filled 2021-08-25: qty 60, 30d supply, fill #0

## 2021-09-08 ENCOUNTER — Other Ambulatory Visit (HOSPITAL_BASED_OUTPATIENT_CLINIC_OR_DEPARTMENT_OTHER): Payer: Self-pay

## 2021-09-08 MED ORDER — OLANZAPINE 20 MG PO TABS
ORAL_TABLET | ORAL | 0 refills | Status: DC
  Filled 2021-09-08: qty 30, 30d supply, fill #0

## 2021-09-08 MED ORDER — MIRTAZAPINE 7.5 MG PO TABS
ORAL_TABLET | ORAL | 0 refills | Status: DC
  Filled 2021-09-08: qty 30, 30d supply, fill #0

## 2021-10-01 ENCOUNTER — Other Ambulatory Visit: Payer: Self-pay

## 2021-10-01 ENCOUNTER — Emergency Department (HOSPITAL_COMMUNITY)
Admission: EM | Admit: 2021-10-01 | Discharge: 2021-10-01 | Disposition: A | Discharge status: Home / Self Care | Payer: 59 | Attending: Emergency Medicine | Admitting: Emergency Medicine

## 2021-10-01 ENCOUNTER — Emergency Department (HOSPITAL_COMMUNITY): Payer: 59

## 2021-10-01 ENCOUNTER — Encounter (HOSPITAL_COMMUNITY): Payer: Self-pay | Admitting: Emergency Medicine

## 2021-10-01 DIAGNOSIS — R079 Chest pain, unspecified: Secondary | ICD-10-CM | POA: Diagnosis not present

## 2021-10-01 DIAGNOSIS — R0602 Shortness of breath: Secondary | ICD-10-CM | POA: Diagnosis not present

## 2021-10-01 DIAGNOSIS — Z91010 Allergy to peanuts: Secondary | ICD-10-CM | POA: Diagnosis not present

## 2021-10-01 LAB — CBC
HCT: 48.3 % (ref 39.0–52.0)
Hemoglobin: 16.3 g/dL (ref 13.0–17.0)
MCH: 30.7 pg (ref 26.0–34.0)
MCHC: 33.7 g/dL (ref 30.0–36.0)
MCV: 91 fL (ref 80.0–100.0)
Platelets: 225 10*3/uL (ref 150–400)
RBC: 5.31 MIL/uL (ref 4.22–5.81)
RDW: 13.4 % (ref 11.5–15.5)
WBC: 9.4 10*3/uL (ref 4.0–10.5)
nRBC: 0 % (ref 0.0–0.2)

## 2021-10-01 LAB — BASIC METABOLIC PANEL
Anion gap: 12 (ref 5–15)
BUN: 5 mg/dL — ABNORMAL LOW (ref 6–20)
CO2: 25 mmol/L (ref 22–32)
Calcium: 9.5 mg/dL (ref 8.9–10.3)
Chloride: 102 mmol/L (ref 98–111)
Creatinine, Ser: 1.19 mg/dL (ref 0.61–1.24)
GFR, Estimated: >60 mL/min (ref >60)
Glucose, Bld: 104 mg/dL — ABNORMAL HIGH (ref 70–99)
Potassium: 3.6 mmol/L (ref 3.5–5.1)
Sodium: 139 mmol/L (ref 135–145)

## 2021-10-01 LAB — TROPONIN I (HIGH SENSITIVITY): Troponin I (High Sensitivity): 3 ng/L (ref <18)

## 2021-10-01 MED ORDER — IBUPROFEN 400 MG PO TABS
600.0000 mg | ORAL_TABLET | Freq: Once | ORAL | Status: AC
  Administered 2021-10-01: 600 mg via ORAL
  Filled 2021-10-01: qty 1

## 2021-10-01 NOTE — ED Triage Notes (Signed)
Pt states, "I'm sick.  I think I am septic".  Slow to answer questions.  Reports SOB and chest pain x 10 min.  Pt's mom states he just smoked marijuana prior to symptoms starting.

## 2021-10-01 NOTE — ED Notes (Signed)
Patient transported to X-ray 

## 2021-10-01 NOTE — ED Provider Notes (Signed)
[facility] EMERGENCY DEPARTMENT Provider Note   CSN: [id number] Arrival date & time: 10/01/21  0741     History  Chief Complaint  Patient presents with   Chest Pain    After smoking marijuana   Shortness of Breath    Lawrence [name] is a 26 y.o. male with a past medical history of schizoaffective disorder, drug use and schizophrenia presenting today due to chest pain and shortness of breath.  He reports that this morning after he smoked marijuana he began to feel the symptoms.  Reports that he smokes marijuana 1-2 times a day.  Did not smoke anything different than what he usually does.  Says that his shortness of breath has gone away but described it as "hard to catch my breath."  When asked what the chest pain appears like and where it is located he says "it is not really pain I just feel sick."  No history of ACS, hypertension, diabetes or other risk factors.  Denies any drug use outside of marijuana.  Does not drink alcohol.  Grandmother is at the bedside and reports that he is supposed to be taking medications for depression however he has not.  Patient reports he has not taken these in over a week.  Rates his discomfort in his chest as 7/10 at the moment.   Home Medications Prior to Admission medications   Medication Sig Start Date End Date Taking? Authorizing Provider  mirtazapine (REMERON) 15 MG tablet Take 1/2 tablet by mouth at bedtime for sleeplessness 08/25/21     mirtazapine (REMERON) 7.5 MG tablet Take 1 tablet by mouth at bedtime 09/08/21     OLANZapine (ZYPREXA) 10 MG tablet Take 1 tablet by mouth twice a day for psychosis 08/25/21     OLANZapine (ZYPREXA) 20 MG tablet Take 1 tablet by mouth at bedtime 09/08/21         Allergies    Bee venom, Apple, Corn-containing products, Lac bovis, Other, and Peanut butter flavor    Review of Systems   Review of Systems  Constitutional:  Negative for chills and fever.  HENT:  Negative for sore throat.   Respiratory:   Positive for shortness of breath. Negative for cough.   Cardiovascular:  Positive for chest pain.  Gastrointestinal:  Negative for diarrhea, nausea and vomiting.  See HPI Physical Exam Updated Vital Signs BP 119/70    Pulse 72    Temp 97.7 F (36.5 C) (Oral)    Resp 13    SpO2 98%  Physical Exam Vitals and nursing note reviewed.  Constitutional:      Appearance: Normal appearance. He is not ill-appearing.  HENT:     Head: Normocephalic and atraumatic.  Eyes:     General: No scleral icterus.    Conjunctiva/sclera: Conjunctivae normal.  Cardiovascular:     Rate and Rhythm: Normal rate and regular rhythm.  Pulmonary:     Effort: Pulmonary effort is normal. No respiratory distress.     Breath sounds: Normal breath sounds.  Chest:     Chest wall: No tenderness.  Abdominal:     Palpations: Abdomen is soft.  Skin:    General: Skin is warm and dry.     Findings: No rash.  Neurological:     Mental Status: He is alert.  Psychiatric:        Mood and Affect: Mood normal.     Comments: Very slow to respond to questions and with a very flat affect  ED Results / Procedures / Treatments   Labs (all labs ordered are listed, but only abnormal results are displayed) Labs Reviewed  CBC  BASIC METABOLIC PANEL  RAPID URINE DRUG SCREEN, HOSP PERFORMED  TROPONIN I (HIGH SENSITIVITY)    EKG EKG Interpretation  Date/Time:  Sunday October 01 2021 07:50:28 EST Ventricular Rate:  104 PR Interval:  136 QRS Duration: 90 QT Interval:  346 QTC Calculation: 454 R Axis:   221 Text Interpretation: Sinus tachycardia Right atrial enlargement Right superior axis deviation Pulmonary disease pattern Abnormal ECG When compared with ECG of 14-Aug-2021 09:49, rate is increased Confirmed by Aletta Edouard [phone]) on 10/01/2021 7:55:58 AM  Radiology DG Chest 2 View  Result Date: 10/01/2021 CLINICAL DATA:  Chest pain EXAM: CHEST - 2 VIEW COMPARISON:  08/11/2015 FINDINGS: Artifact from EKG leads.  Normal heart size and mediastinal contours. No acute infiltrate or edema. No effusion or pneumothorax. No acute osseous findings. IMPRESSION: No active cardiopulmonary disease. Electronically Signed   By: Jorje Guild M.D.   On: 10/01/2021 08:22    Procedures Procedures  Normal sinus rhythm with a normal rate  Medications Ordered in ED Medications - No data to display  ED Course/ Medical Decision Making/ A&P                           Medical Decision Making Amount and/or Complexity of Data Reviewed Labs: ordered. Radiology: ordered.  26 year old male presenting with the complaint of chest pain. The emergent differential diagnosis of chest pain includes: Acute coronary syndrome, pericarditis, aortic dissection, pulmonary embolism, tension pneumothorax, and esophageal rupture. All of these were considered throughout the evaluation of the patient.  Co morbidities that complicate the patient evaluation include: Schizoaffective disorder and drug use  Additional history obtained from internal and external records: I reviewed the patient's internal records and emergency visits. In December, patient presented for altered mental status and admitted to cocaine use.  Denies this today however UDS ordered.   Workup:  Lab Tests:  I Ordered, and personally interpreted labs.   The pertinent results include: Negative troponin x1.   2. Imaging Studies ordered:  I ordered imaging studies including chest x-ray. I independently visualized and interpreted these and agree with the radiologist that there are no abnormalities.  3. Cardiac Monitoring:  The patient was maintained on a cardiac monitor.  I personally viewed and interpreted the cardiac monitored which showed NSR with normal rate  4. Test Considered: I considered ordering a second troponin however patient is with a heart score 0, negative first troponin, normal EKG and denies pain or difficulty breathing at this  time.  Treatment:  Medications:  I ordered medication including ibuprofen for pain.  On reevaluation patient stated that this made him feel better.   Dispo:  Problem List / ED Course:  Nonspecific chest pain social determinants of health include: Unmedicated mental health disorders and substance use disorder   Dispostion: After consideration of the diagnostic results and the patients response to treatment, I feel that the patent would benefit from discharge with return precautions.   Patient has a heart score of 0.  No history of ACS or risk factors.  I considered withdrawal from patient's mirtazapine/olanzapine however patient is without other signs and symptoms of withdrawal.  I suspect his discomfort to be secondary to drug use or anxiety attack.  We discussed the risks of drug use as well as substances that frequently cause the symptoms he is  experiencing. He was also counseled on getting back on his medications.   He and his grandmother are agreeable to this plan. To be discharged in good condition.    Final Clinical Impression(s) / ED Diagnoses Final diagnoses:  Chest pain, unspecified type    Rx / DC Orders Results and diagnoses were explained to the patient. Return precautions discussed in full. Patient had no additional questions and expressed complete understanding.   This chart was dictated using voice recognition software.  Despite best efforts to proofread,  errors can occur which can change the documentation meaning.    Darliss Ridgel 10/01/21 1130    Hayden Rasmussen, MD 10/01/21 1731

## 2021-10-01 NOTE — Discharge Instructions (Signed)
Please restart your medication.  Also do your best to cut down on your marijuana use.  Return with any worsening symptoms.  Information about chest pain is attached to these discharge papers, please read this.  You may use ibuprofen or Tylenol for your discomfort

## 2021-10-05 ENCOUNTER — Other Ambulatory Visit (HOSPITAL_BASED_OUTPATIENT_CLINIC_OR_DEPARTMENT_OTHER): Payer: Self-pay

## 2021-10-05 MED ORDER — MIRTAZAPINE 7.5 MG PO TABS
ORAL_TABLET | ORAL | 0 refills | Status: DC
  Filled 2021-10-05: qty 30, 30d supply, fill #0

## 2021-10-05 MED ORDER — OLANZAPINE 20 MG PO TABS
ORAL_TABLET | ORAL | 0 refills | Status: DC
  Filled 2021-10-05: qty 30, 30d supply, fill #0

## 2021-10-13 ENCOUNTER — Other Ambulatory Visit (HOSPITAL_BASED_OUTPATIENT_CLINIC_OR_DEPARTMENT_OTHER): Payer: Self-pay

## 2021-10-30 ENCOUNTER — Other Ambulatory Visit: Payer: Self-pay

## 2021-10-30 ENCOUNTER — Ambulatory Visit (HOSPITAL_COMMUNITY)
Admission: EM | Admit: 2021-10-30 | Discharge: 2021-10-31 | Disposition: A | Discharge status: Home / Self Care | Payer: 59 | Attending: Psychiatry | Admitting: Psychiatry

## 2021-10-30 DIAGNOSIS — Z20822 Contact with and (suspected) exposure to covid-19: Secondary | ICD-10-CM | POA: Insufficient documentation

## 2021-10-30 DIAGNOSIS — F988 Other specified behavioral and emotional disorders with onset usually occurring in childhood and adolescence: Secondary | ICD-10-CM | POA: Insufficient documentation

## 2021-10-30 DIAGNOSIS — F250 Schizoaffective disorder, bipolar type: Secondary | ICD-10-CM | POA: Insufficient documentation

## 2021-10-30 DIAGNOSIS — Z79899 Other long term (current) drug therapy: Secondary | ICD-10-CM | POA: Insufficient documentation

## 2021-10-30 DIAGNOSIS — F331 Major depressive disorder, recurrent, moderate: Secondary | ICD-10-CM | POA: Diagnosis not present

## 2021-10-30 LAB — CBC WITH DIFFERENTIAL/PLATELET
Abs Immature Granulocytes: 0.03 10*3/uL (ref 0.00–0.07)
Basophils Absolute: 0.1 10*3/uL (ref 0.0–0.1)
Basophils Relative: 1 %
Eosinophils Absolute: 0.2 10*3/uL (ref 0.0–0.5)
Eosinophils Relative: 2 %
HCT: 45.3 % (ref 39.0–52.0)
Hemoglobin: 15.6 g/dL (ref 13.0–17.0)
Immature Granulocytes: 0 %
Lymphocytes Relative: 28 %
Lymphs Abs: 3 10*3/uL (ref 0.7–4.0)
MCH: 30.7 pg (ref 26.0–34.0)
MCHC: 34.4 g/dL (ref 30.0–36.0)
MCV: 89.2 fL (ref 80.0–100.0)
Monocytes Absolute: 0.7 10*3/uL (ref 0.1–1.0)
Monocytes Relative: 7 %
Neutro Abs: 6.6 10*3/uL (ref 1.7–7.7)
Neutrophils Relative %: 62 %
Platelets: 223 10*3/uL (ref 150–400)
RBC: 5.08 MIL/uL (ref 4.22–5.81)
RDW: 12.8 % (ref 11.5–15.5)
WBC: 10.6 10*3/uL — ABNORMAL HIGH (ref 4.0–10.5)
nRBC: 0 % (ref 0.0–0.2)

## 2021-10-30 LAB — POCT URINE DRUG SCREEN - MANUAL ENTRY (I-SCREEN)
POC Amphetamine UR: NOT DETECTED
POC Buprenorphine (BUP): NOT DETECTED
POC Cocaine UR: NOT DETECTED
POC Marijuana UR: POSITIVE — AB
POC Methadone UR: NOT DETECTED
POC Methamphetamine UR: NOT DETECTED
POC Morphine: NOT DETECTED
POC Oxazepam (BZO): NOT DETECTED
POC Oxycodone UR: NOT DETECTED
POC Secobarbital (BAR): NOT DETECTED

## 2021-10-30 LAB — POC SARS CORONAVIRUS 2 AG: SARSCOV2ONAVIRUS 2 AG: NEGATIVE

## 2021-10-30 LAB — POC SARS CORONAVIRUS 2 AG -  ED: SARS Coronavirus 2 Ag: NEGATIVE

## 2021-10-30 MED ORDER — OLANZAPINE 10 MG PO TABS: 20.0000 mg | ORAL_TABLET | Freq: Every day | ORAL | Status: DC

## 2021-10-30 MED ORDER — MIRTAZAPINE 7.5 MG PO TABS: 7.5000 mg | ORAL_TABLET | Freq: Every day | ORAL | Status: DC

## 2021-10-30 MED ORDER — MAGNESIUM HYDROXIDE 400 MG/5ML PO SUSP: 30.0000 mL | Freq: Every day | ORAL | Status: DC | PRN

## 2021-10-30 MED ORDER — HYDROXYZINE HCL 25 MG PO TABS: 25.0000 mg | ORAL_TABLET | Freq: Three times a day (TID) | ORAL | Status: DC | PRN

## 2021-10-30 MED ORDER — ALUM & MAG HYDROXIDE-SIMETH 200-200-20 MG/5ML PO SUSP: 30.0000 mL | ORAL | Status: DC | PRN

## 2021-10-30 MED ORDER — ACETAMINOPHEN 325 MG PO TABS: 650.0000 mg | ORAL_TABLET | Freq: Four times a day (QID) | ORAL | Status: DC | PRN

## 2021-10-30 NOTE — ED Notes (Signed)
Pt siting on his bed eating a snack. Pt calm and cooperative. No c/o pain or distress. Will continue to monitor for safety ?

## 2021-10-30 NOTE — ED Provider Notes (Signed)
Behavioral Health Admission H&P ?(FBC & OBS) ? ?Date: 10/31/21 ?Patient Name: Lawrence [name] ?MRN: [id number] ?Chief Complaint:  ?Chief Complaint  ?Patient presents with  ? Depression  ?   ? ?Diagnoses:  ?Final diagnoses:  ?Moderate episode of recurrent major depressive disorder (HCC)  ?Schizoaffective disorder, bipolar type (HCC)  ?Attention deficit disorder, unspecified hyperactivity presence  ? ? ?HPI: 26 y.o male was drop off by this mother at [facility], c/o of worsening depression that usual du to increase argument with his grandmother. Pt refused to go in dept as to what is really doing on at home.  Per the patient his grandmother has legal guardian Donata Clay [phone]).  patient gave permission for NP to contract grandmother,  but no response.    ? ?Collateral:  Mother Fredderick Erb [phone] called to confirm that the grandmother was the legal guardian but the mother stated no on has guardian at this time. When asked if they could pick up the patient after discharge,  mother stated the patient "his on his own,  i'm not able to pick him up,  and is cannot go back to his grandmother because she has a restraint order against him".   ? ?Pt has history of Manic depression and Schizoaffective, ADD ? ?Pt denies SI, HI,  substance abuse,  drinking or smoking.   Per the patient he has not taken his medication in  4 days,  when asked if he as medication at home or if he is out of his medication patient stated he has the medication but didn't take them because he is too stress out.  Pt unable to name the medication he takes.  Pt unsure of his psychiatrist or if he sees a Veterinary surgeon.  ? ?Observation of patient,  he is alert and oriented,  unkept  and odor.  Unwilling to talk about why he continue to have arguments with his family.   Will restart patient home medication olanzapine 20 mg p.o tab qhs,  Mirtazapine 7.5 mg p.o tab qhs.  ? ?PHQ 2-9:   ?Flowsheet Row ED from 10/30/2021 in [facility] ED from 10/01/2021 in [facility] EMERGENCY DEPARTMENT ED from 08/14/2021 in [facility] EMERGENCY DEPARTMENT  ?C-SSRS RISK CATEGORY No Risk No Risk No Risk  ? ?  ?  ? ?Total Time spent with patient: 20 minutes ? ?Musculoskeletal  ?Strength & Muscle Tone: within normal limits ?Gait & Station: normal ?Patient leans: N/A ? ?Psychiatric Specialty Exam  ?Presentation ?General Appearance: Casual ? ?Eye Contact:Fair ? ?Speech:Clear and Coherent ? ?Speech Volume:Normal ? ?Handedness:Ambidextrous ? ? ?Mood and Affect  ?Mood:Anxious; Depressed ? ?Affect:Appropriate ? ? ?Thought Process  ?Thought Processes:Coherent ? ?Descriptions of Associations:Circumstantial ? ?Orientation:Full (Time, Place and Person) ? ?Thought Content:Abstract Reasoning ? Diagnosis of Schizophrenia or Schizoaffective disorder in past: Yes ? Duration of Psychotic Symptoms: Greater than six months ? ?Hallucinations:Hallucinations: None ? ?Ideas of Reference:None ? ?Suicidal Thoughts:Suicidal Thoughts: No ? ?Homicidal Thoughts:Homicidal Thoughts: No ? ? ?Sensorium  ?Memory:Immediate Fair ? ?Judgment:Fair ? ?Insight:Fair ? ? ?Executive Functions  ?Concentration:Fair ? ?Attention Span:Fair ? ?Recall:Fair ? ?Fund of Knowledge:Fair ? ?Language:Fair ? ? ?Psychomotor Activity  ?Psychomotor Activity:No data recorded ? ?Assets  ?Assets:Desire for Improvement ? ? ?Sleep  ?Sleep:Sleep: Fair ? ? ?Nutritional Assessment (For OBS and FBC admissions only) ?Has the patient had a weight loss or gain of 10 pounds or more in the last 3 months?: No ?Has the patient had a decrease in food intake/or  appetite?: No ?Does the patient have dental problems?: No ?Does the patient have eating habits or behaviors that may be indicators of an eating disorder including binging or inducing vomiting?: No ?Has the patient recently lost weight without trying?: 0 ?Has the patient been eating poorly because of a decreased appetite?:  0 ?Malnutrition Screening Tool Score: 0 ? ? ? ?Physical Exam ?HENT:  ?   Head: Normocephalic.  ?   Nose: Nose normal.  ?   Mouth/Throat:  ?   Mouth: Mucous membranes are moist.  ?Eyes:  ?   Pupils: Pupils are equal, round, and reactive to light.  ?Pulmonary:  ?   Effort: Pulmonary effort is normal.  ?Musculoskeletal:  ?   Cervical back: Normal range of motion.  ?Skin: ?   General: Skin is warm.  ?Neurological:  ?   General: No focal deficit present.  ?   Mental Status: He is alert.  ? ?Review of Systems  ?Constitutional: Negative.   ?HENT: Negative.    ?Eyes: Negative.   ?Respiratory: Negative.    ?Cardiovascular: Negative.   ?Gastrointestinal: Negative.   ?Genitourinary: Negative.   ?Musculoskeletal: Negative.   ?Skin: Negative.   ?Neurological: Negative.   ?Endo/Heme/Allergies: Negative.   ?Psychiatric/Behavioral:  Positive for depression.   ? ?Blood pressure 127/78, pulse 76, temperature 98.1 ?F (36.7 ?C), temperature source Oral, resp. rate 18, SpO2 97 %. There is no height or weight on file to calculate BMI. ? ?Past Psychiatric History: Manic depression and Schizophrenia   ? ?Is the patient at risk to self? No  ?Has the patient been a risk to self in the past 6 months? No .    ?Has the patient been a risk to self within the distant past? No   ?Is the patient a risk to others? No   ?Has the patient been a risk to others in the past 6 months? No   ?Has the patient been a risk to others within the distant past? No  ? ?Past Medical History:  ?Past Medical History:  ?Diagnosis Date  ? ADD (attention deficit disorder)   ? Asthma   ? Depressed   ? Schizoaffective disorder (HCC)   ? No past surgical history on file. ? ?Family History: No family history on file. ? ?Social History:  ?Social History  ? ?Socioeconomic History  ? Marital status: Single  ?  Spouse name: Not on file  ? Number of children: Not on file  ? Years of education: Not on file  ? Highest education level: Not on file  ?Occupational History  ?  Occupation: Unemployed  ?Tobacco Use  ? Smoking status: Some Days  ?  Years: 0.00  ?  Types: Cigarettes  ? Smokeless tobacco: Never  ?Vaping Use  ? Vaping Use: Never used  ?Substance and Sexual Activity  ? Alcohol use: Not Currently  ? Drug use: Yes  ?  Frequency: 2.0 times per week  ?  Types: Marijuana  ? Sexual activity: Never  ?Other Topics Concern  ? Not on file  ?Social History Narrative  ? Pt is unemployed; lives with grandmother  ? ?Social Determinants of Health  ? ?Financial Resource Strain: Not on file  ?Food Insecurity: Not on file  ?Transportation Needs: Not on file  ?Physical Activity: Not on file  ?Stress: Not on file  ?Social Connections: Not on file  ?Intimate Partner Violence: Not on file  ? ? ?SDOH:  ?SDOH Screenings  ? ?Alcohol Screen: Not on file  ?Depression (PHQ2-9): Not  on file  ?Financial Resource Strain: Not on file  ?Food Insecurity: Not on file  ?Housing: Not on file  ?Physical Activity: Not on file  ?Social Connections: Not on file  ?Stress: Not on file  ?Tobacco Use: High Risk  ? Smoking Tobacco Use: Some Days  ? Smokeless Tobacco Use: Never  ? Passive Exposure: Not on file  ?Transportation Needs: Not on file  ? ? ?Last Labs:  ?Admission on 10/30/2021  ?Component Date Value Ref Range Status  ? SARS Coronavirus 2 by RT PCR 10/30/2021 NEGATIVE  NEGATIVE Final  ? Comment: (NOTE) ?SARS-CoV-2 target nucleic acids are NOT DETECTED. ? ?The SARS-CoV-2 RNA is generally detectable in upper respiratory ?specimens during the acute phase of infection. The lowest ?concentration of SARS-CoV-2 viral copies this assay can detect is ?138 copies/mL. A negative result does not preclude SARS-Cov-2 ?infection and should not be used as the sole basis for treatment or ?other patient management decisions. A negative result may occur with  ?improper specimen collection/handling, submission of specimen other ?than nasopharyngeal swab, presence of viral mutation(s) within the ?areas targeted by this assay, and  inadequate number of viral ?copies(<138 copies/mL). A negative result must be combined with ?clinical observations, patient history, and epidemiological ?information. The expected result is Negative. ? ?Fact Sheet for Patients:  ?

## 2021-10-30 NOTE — ED Provider Notes (Incomplete)
Behavioral Health Admission [facility]&P [facility] & OBS)  Date: 10/30/21 Patient Name: Lawrence [name] MRN: [id number] Chief Complaint:  Chief Complaint  Patient presents with   Depression      Diagnoses:  Final diagnoses:  Moderate episode of recurrent major depressive disorder (HCC)  Schizoaffective disorder, bipolar type (HCC)  Attention deficit disorder, unspecified hyperactivity presence    HPI: 26 y.o male was drop off by this mother at [facility], c/o of worsening depression that usual du to increase argument with his grandmother. Pt refused to go in dept as to what is really doing on at home.  Per the patient his grandmother has legal guardian Donata Clay [phone]).  patient gave permission for NP to contract grandmother,  but no response.     Collateral:  Mother Fredderick Erb [phone] called to confirm that the grandmother was the legal guardian but the mother stated no on has guardian at this time. When asked if they could pick up the patient after discharge,  mother stated the patient "his on his own,  i'm not able to pick him up,  and is cannot go back to his grandmother because she has a restraint order against him".    Pt has history of Manic depression and Schizoaffective, ADD  Pt denies SI, HI,  substance abuse,  drinking or smoking.   Per the patient he has not taken his medication in  4 days,  when asked if he as medication at home or if he is out of his medication patient stated he has the medication but didn't take them because he is too stress out.  Pt unable to name the medication he takes.  Pt unsure of his psychiatrist or if he sees a Veterinary surgeon.   Observation of patient,  he is alert and oriented,  unkept  and odor.  Unwilling to talk about why he continue to have arguments with his family.     PHQ 2-9:   Flowsheet Row ED from 10/30/2021 in [facility] ED from 10/01/2021 in [facility] EMERGENCY DEPARTMENT ED from  08/14/2021 in [facility] EMERGENCY DEPARTMENT  C-SSRS RISK CATEGORY No Risk No Risk No Risk        Total Time spent with patient: 20 minutes  Musculoskeletal  Strength & Muscle Tone: within normal limits Gait & Station: normal Patient leans: N/A  Psychiatric Specialty Exam  Presentation General Appearance: Casual  Eye Contact:Fair  Speech:Clear and Coherent  Speech Volume:Normal  Handedness:Ambidextrous   Mood and Affect  Mood:Anxious; Depressed  Affect:Appropriate   Thought Process  Thought Processes:Coherent  Descriptions of Associations:Circumstantial  Orientation:Full (Time, Place and Person)  Thought Content:Abstract Reasoning  Diagnosis of Schizophrenia or Schizoaffective disorder in past: Yes  Duration of Psychotic Symptoms: Greater than six months  Hallucinations:Hallucinations: None  Ideas of Reference:None  Suicidal Thoughts:Suicidal Thoughts: No  Homicidal Thoughts:Homicidal Thoughts: No   Sensorium  Memory:Immediate Fair  Judgment:Fair  Insight:Fair   Executive Functions  Concentration:Fair  Attention Span:Fair  Recall:Fair  Fund of Knowledge:Fair  Language:Fair   Psychomotor Activity  Psychomotor Activity:No data recorded  Assets  Assets:Desire for Improvement   Sleep  Sleep:Sleep: Fair   Nutritional Assessment (For OBS and FBC admissions only) Has the patient had a weight loss or gain of 10 pounds or more in the last 3 months?: No Has the patient had a decrease in food intake/or appetite?: No Does the patient have dental problems?: No Does the patient have eating habits or behaviors that  may be indicators of an eating disorder including binging or inducing vomiting?: No Has the patient recently lost weight without trying?: 0 Has the patient been eating poorly because of a decreased appetite?: 0 Malnutrition Screening Tool Score: 0    Physical Exam HENT:     Head: Normocephalic.     Nose:  Nose normal.     Mouth/Throat:     Mouth: Mucous membranes are moist.  Eyes:     Pupils: Pupils are equal, round, and reactive to light.  Pulmonary:     Effort: Pulmonary effort is normal.  Musculoskeletal:     Cervical back: Normal range of motion.  Skin:    General: Skin is warm.  Neurological:     General: No focal deficit present.     Mental Status: He is alert.   Review of Systems  Constitutional: Negative.   HENT: Negative.    Eyes: Negative.   Respiratory: Negative.    Cardiovascular: Negative.   Gastrointestinal: Negative.   Genitourinary: Negative.   Musculoskeletal: Negative.   Skin: Negative.   Neurological: Negative.   Endo/Heme/Allergies: Negative.   Psychiatric/Behavioral:  Positive for depression.    Blood pressure 127/78, pulse 76, temperature 98.1 F (36.7 C), temperature source Oral, resp. rate 18, SpO2 97 %. There is no height or weight on file to calculate BMI.  Past Psychiatric History: Manic depression and Schizophrenia    Is the patient at risk to self? No  Has the patient been a risk to self in the past 6 months? No .    Has the patient been a risk to self within the distant past? No   Is the patient a risk to others? No   Has the patient been a risk to others in the past 6 months? No   Has the patient been a risk to others within the distant past? No   Past Medical History:  Past Medical History:  Diagnosis Date   ADD (attention deficit disorder)    Asthma    Depressed    Schizoaffective disorder (HCC)    No past surgical history on file.  Family History: No family history on file.  Social History:  Social History   Socioeconomic History   Marital status: Single    Spouse name: Not on file   Number of children: Not on file   Years of education: Not on file   Highest education level: Not on file  Occupational History   Occupation: Unemployed  Tobacco Use   Smoking status: Some Days    Years: 0.00    Types: Cigarettes    Smokeless tobacco: Never  Vaping Use   Vaping Use: Never used  Substance and Sexual Activity   Alcohol use: Not Currently   Drug use: Yes    Frequency: 2.0 times per week    Types: Marijuana   Sexual activity: Never  Other Topics Concern   Not on file  Social History Narrative   Pt is unemployed; lives with grandmother   Social Determinants of Health   Financial Resource Strain: Not on file  Food Insecurity: Not on file  Transportation Needs: Not on file  Physical Activity: Not on file  Stress: Not on file  Social Connections: Not on file  Intimate Partner Violence: Not on file    SDOH:  SDOH Screenings   Alcohol Screen: Not on file  Depression (PHQ2-9): Not on file  Financial Resource Strain: Not on file  Food Insecurity: Not on file  Housing: Not  on file  Physical Activity: Not on file  Social Connections: Not on file  Stress: Not on file  Tobacco Use: High Risk   Smoking Tobacco Use: Some Days   Smokeless Tobacco Use: Never   Passive Exposure: Not on file  Transportation Needs: Not on file    Last Labs:  Admission on 10/30/2021  Component Date Value Ref Range Status   SARS Coronavirus 2 Ag 10/30/2021 Negative  Negative Preliminary   SARSCOV2ONAVIRUS 2 AG 10/30/2021 NEGATIVE  NEGATIVE Final   Comment: (NOTE) SARS-CoV-2 antigen NOT DETECTED.   Negative results are presumptive.  Negative results do not preclude SARS-CoV-2 infection and should not be used as the sole basis for treatment or other patient management decisions, including infection  control decisions, particularly in the presence of clinical signs and  symptoms consistent with COVID-19, or in those who have been in contact with the virus.  Negative results must be combined with clinical observations, patient history, and epidemiological information. The expected result is Negative.  Fact Sheet for Patients: https://www.jennings-kim.com/  Fact Sheet for Healthcare  Providers: https://alexander-rogers.biz/  This test is not yet approved or cleared by the [place] FDA and  has been authorized for detection and/or diagnosis of SARS-CoV-2 by FDA under an Emergency Use Authorization (EUA).  This EUA will remain in effect (meaning this test can be used) for the duration of  the COV                          ID-19 declaration under Section 564(b)(1) of the Act, 21 U.S.C. section 360bbb-3(b)(1), unless the authorization is terminated or revoked sooner.    Admission on 10/01/2021, Discharged on 10/01/2021  Component Date Value Ref Range Status   Sodium 10/01/2021 139  135 - 145 mmol/L Final   Potassium 10/01/2021 3.6  3.5 - 5.1 mmol/L Final   Chloride 10/01/2021 102  98 - 111 mmol/L Final   CO2 10/01/2021 25  22 - 32 mmol/L Final   Glucose, Bld 10/01/2021 104 (H)  70 - 99 mg/dL Final   Glucose reference range applies only to samples taken after fasting for at least 8 hours.   BUN 10/01/2021 5 (L)  6 - 20 mg/dL Final   Creatinine, Ser 10/01/2021 1.19  0.61 - 1.24 mg/dL Final   Calcium 40/98/1191 9.5  8.9 - 10.3 mg/dL Final   GFR, Estimated 10/01/2021 >60  >60 mL/min Final   Comment: (NOTE) Calculated using the CKD-EPI Creatinine Equation (2021)    Anion gap 10/01/2021 12  5 - 15 Final   Performed at [facility], [address] [place], [place] [address]   WBC 10/01/2021 9.4  4.0 - 10.5 K/uL Final   RBC 10/01/2021 5.31  4.22 - 5.81 MIL/uL Final   Hemoglobin 10/01/2021 16.3  13.0 - 17.0 g/dL Final   HCT 56/21/3086 48.3  39.0 - 52.0 % Final   MCV 10/01/2021 91.0  80.0 - 100.0 fL Final   MCH 10/01/2021 30.7  26.0 - 34.0 pg Final   MCHC 10/01/2021 33.7  30.0 - 36.0 g/dL Final   RDW 57/84/6962 13.4  11.5 - 15.5 % Final   Platelets 10/01/2021 225  150 - 400 K/uL Final   nRBC 10/01/2021 0.0  0.0 - 0.2 % Final   Performed at [facility], [address], [place], [place] [address]   Troponin I (High  Sensitivity) 10/01/2021 3  <18 ng/L Final   Comment: (NOTE)  Elevated high sensitivity troponin I (hsTnI) values and significant  changes across serial measurements may suggest ACS but many other  chronic and acute conditions are known to elevate hsTnI results.  Refer to the "Links" section for chest pain algorithms and additional  guidance. Performed at [facility], [address], [place], [place] [address]   Admission on 08/14/2021, Discharged on 08/16/2021  Component Date Value Ref Range Status   SARS Coronavirus 2 by RT PCR 08/14/2021 NEGATIVE  NEGATIVE Final   Comment: (NOTE) SARS-CoV-2 target nucleic acids are NOT DETECTED.  The SARS-CoV-2 RNA is generally detectable in upper respiratory specimens during the acute phase of infection. The lowest concentration of SARS-CoV-2 viral copies this assay can detect is 138 copies/mL. A negative result does not preclude SARS-Cov-2 infection and should not be used as the sole basis for treatment or other patient management decisions. A negative result may occur with  improper specimen collection/handling, submission of specimen other than nasopharyngeal swab, presence of viral mutation(s) within the areas targeted by this assay, and inadequate number of viral copies(<138 copies/mL). A negative result must be combined with clinical observations, patient history, and epidemiological information. The expected result is Negative.  Fact Sheet for Patients:  BloggerCourse.com  Fact Sheet for Healthcare Providers:  SeriousBroker.it  This test is no                          t yet approved or cleared by the [place] FDA and  has been authorized for detection and/or diagnosis of SARS-CoV-2 by FDA under an Emergency Use Authorization (EUA). This EUA will remain  in effect (meaning this test can be used) for the duration of the COVID-19 declaration under Section 564(b)(1) of the Act,  21 U.S.C.section 360bbb-3(b)(1), unless the authorization is terminated  or revoked sooner.       Influenza A by PCR 08/14/2021 NEGATIVE  NEGATIVE Final   Influenza B by PCR 08/14/2021 NEGATIVE  NEGATIVE Final   Comment: (NOTE) The Xpert Xpress SARS-CoV-2/FLU/RSV plus assay is intended as an aid in the diagnosis of influenza from Nasopharyngeal swab specimens and should not be used as a sole basis for treatment. Nasal washings and aspirates are unacceptable for Xpert Xpress SARS-CoV-2/FLU/RSV testing.  Fact Sheet for Patients: BloggerCourse.com  Fact Sheet for Healthcare Providers: SeriousBroker.it  This test is not yet approved or cleared by the [place] FDA and has been authorized for detection and/or diagnosis of SARS-CoV-2 by FDA under an Emergency Use Authorization (EUA). This EUA will remain in effect (meaning this test can be used) for the duration of the COVID-19 declaration under Section 564(b)(1) of the Act, 21 U.S.C. section 360bbb-3(b)(1), unless the authorization is terminated or revoked.  Performed at [facility], [address], [place], [place] [address]    Sodium 08/14/2021 140  135 - 145 mmol/L Final   Potassium 08/14/2021 4.2  3.5 - 5.1 mmol/L Final   Chloride 08/14/2021 105  98 - 111 mmol/L Final   CO2 08/14/2021 25  22 - 32 mmol/L Final   Glucose, Bld 08/14/2021 82  70 - 99 mg/dL Final   Glucose reference range applies only to samples taken after fasting for at least 8 hours.   BUN 08/14/2021 8  6 - 20 mg/dL Final   Creatinine, Ser 08/14/2021 1.13  0.61 - 1.24 mg/dL Final   Calcium 53/61/4431 9.4  8.9 - 10.3 mg/dL Final   Total Protein 54/00/8676 6.9  6.5 - 8.1 g/dL Final   Albumin 16/10/960412/26/2022 4.5  3.5 - 5.0 g/dL Final   AST 54/09/811912/26/2022 21  15 - 41 U/L Final   ALT 08/14/2021 13  0 - 44 U/L Final   Alkaline Phosphatase 08/14/2021 56  38 - 126 U/L Final   Total Bilirubin  08/14/2021 1.7 (H)  0.3 - 1.2 mg/dL Final   GFR, Estimated 08/14/2021 >60  >60 mL/min Final   Comment: (NOTE) Calculated using the CKD-EPI Creatinine Equation (2021)    Anion gap 08/14/2021 10  5 - 15 Final   Performed at [facility], [address] [place], [place] [address]   Alcohol, Ethyl (B) 08/14/2021 <10  <10 mg/dL Final   Comment: (NOTE) Lowest detectable limit for serum alcohol is 10 mg/dL.  For medical purposes only. Performed at [facility], [address] [place], [place] [address]    Opiates 08/14/2021 NONE DETECTED  NONE DETECTED Final   Cocaine 08/14/2021 NONE DETECTED  NONE DETECTED Final   Benzodiazepines 08/14/2021 NONE DETECTED  NONE DETECTED Final   Amphetamines 08/14/2021 NONE DETECTED  NONE DETECTED Final   Tetrahydrocannabinol 08/14/2021 POSITIVE (A)  NONE DETECTED Final   Barbiturates 08/14/2021 NONE DETECTED  NONE DETECTED Final   Comment: (NOTE) DRUG SCREEN FOR MEDICAL PURPOSES ONLY.  IF CONFIRMATION IS NEEDED FOR ANY PURPOSE, NOTIFY LAB WITHIN 5 DAYS.  LOWEST DETECTABLE LIMITS FOR URINE DRUG SCREEN Drug Class                     Cutoff (ng/mL) Amphetamine and metabolites    1000 Barbiturate and metabolites    200 Benzodiazepine                 200 Tricyclics and metabolites     300 Opiates and metabolites        300 Cocaine and metabolites        300 THC                            50 Performed at [facility], [address] [place], [place] [address]    WBC 08/14/2021 8.6  4.0 - 10.5 K/uL Final   RBC 08/14/2021 4.81  4.22 - 5.81 MIL/uL Final   Hemoglobin 08/14/2021 14.5  13.0 - 17.0 g/dL Final   HCT 78/46/962912/26/2022 43.5  39.0 - 52.0 % Final   MCV 08/14/2021 90.4  80.0 - 100.0 fL Final   MCH 08/14/2021 30.1  26.0 - 34.0 pg Final   MCHC 08/14/2021 33.3  30.0 - 36.0 g/dL Final   RDW 52/84/132412/26/2022 13.0  11.5 - 15.5 % Final   Platelets 08/14/2021 173  150 - 400 K/uL Final   nRBC 08/14/2021 0.0  0.0 - 0.2 % Final    Neutrophils Relative % 08/14/2021 72  % Final   Neutro Abs 08/14/2021 6.2  1.7 - 7.7 K/uL Final   Lymphocytes Relative 08/14/2021 20  % Final   Lymphs Abs 08/14/2021 1.7  0.7 - 4.0 K/uL Final   Monocytes Relative 08/14/2021 7  % Final   Monocytes Absolute 08/14/2021 0.6  0.1 - 1.0 K/uL Final   Eosinophils Relative 08/14/2021 0  % Final   Eosinophils Absolute 08/14/2021 0.0  0.0 - 0.5 K/uL Final   Basophils Relative 08/14/2021 0  % Final   Basophils Absolute 08/14/2021 0.0  0.0 - 0.1 K/uL Final   Immature Granulocytes 08/14/2021 1  %  Final   Abs Immature Granulocytes 08/14/2021 0.04  0.00 - 0.07 K/uL Final   Performed at [facility], [address], [place], [place] [address]   Salicylate Lvl 08/14/2021 <7.0 (L)  7.0 - 30.0 mg/dL Final   Performed at [facility], [address] [place], [place] [address]   Acetaminophen (Tylenol), Serum 08/14/2021 <10 (L)  10 - 30 ug/mL Final   Performed at [facility], [address], [place], [place] [address]    Allergies: Bee venom, Apple juice, Corn-containing products, Lac bovis, Other, and Peanut butter flavor  PTA Medications: (Not in a hospital admission)   Medical Decision Making  Recommend: Inpatient admission   Lab Orders         Resp Panel by RT-PCR (Flu A&B, Covid) Nasopharyngeal Swab         CBC with Differential/Platelet         Comprehensive metabolic panel         TSH         Lipid panel         POC SARS Coronavirus 2 Ag-ED - Nasal Swab         POCT Urine Drug Screen - (ICup)         POC SARS Coronavirus 2 Ag       Meds ordered this encounter  Medications   acetaminophen (TYLENOL) tablet 650 mg   alum & mag hydroxide-simeth (MAALOX/MYLANTA) 200-200-20 MG/5ML suspension 30 mL   magnesium hydroxide (MILK OF MAGNESIA) suspension 30 mL   hydrOXYzine (ATARAX) tablet 25 mg   OLANZapine (ZYPREXA) tablet 20 mg   mirtazapine (REMERON) tablet 7.5 mg     Recommendations  Based on my  evaluation the patient does not appear to have an emergency medical condition. Pt has no where to go at this time,  lived with his grandmother but according to patient mother Fredderick Erb,  the grandmother have a restraint order out against patient.   Sindy Guadeloupe, NP 10/30/21  11:39 PM

## 2021-10-30 NOTE — ED Notes (Signed)
Pt A&O x 4, presents with complaint of depression, pt reports he feels like he isn't loved.  Grandparents would not accept pt back into the home.  Denies SI, HI or AVH.  Monitoring for safety. ?

## 2021-10-30 NOTE — BH Assessment (Signed)
Pt to [facility] voluntarily with chief complaint of reports worsening depression for the past week. Pt reports stressors of living at home and "not feeling loved". Pt reports recent argument with family but does not want to share what argument was about. Pt denies SI, HI, AVH and substance use. Pt does not have outpatient services. ? ?Pt is routine. ?

## 2021-10-31 DIAGNOSIS — F331 Major depressive disorder, recurrent, moderate: Secondary | ICD-10-CM | POA: Diagnosis not present

## 2021-10-31 DIAGNOSIS — F250 Schizoaffective disorder, bipolar type: Secondary | ICD-10-CM

## 2021-10-31 DIAGNOSIS — F988 Other specified behavioral and emotional disorders with onset usually occurring in childhood and adolescence: Secondary | ICD-10-CM

## 2021-10-31 LAB — COMPREHENSIVE METABOLIC PANEL
ALT: 22 U/L (ref 0–44)
AST: 30 U/L (ref 15–41)
Albumin: 4.3 g/dL (ref 3.5–5.0)
Alkaline Phosphatase: 69 U/L (ref 38–126)
Anion gap: 10 (ref 5–15)
BUN: 6 mg/dL (ref 6–20)
CO2: 26 mmol/L (ref 22–32)
Calcium: 9.5 mg/dL (ref 8.9–10.3)
Chloride: 103 mmol/L (ref 98–111)
Creatinine, Ser: 1.07 mg/dL (ref 0.61–1.24)
GFR, Estimated: >60 mL/min (ref >60)
Glucose, Bld: 89 mg/dL (ref 70–99)
Potassium: 3.3 mmol/L — ABNORMAL LOW (ref 3.5–5.1)
Sodium: 139 mmol/L (ref 135–145)
Total Bilirubin: 1 mg/dL (ref 0.3–1.2)
Total Protein: 7.1 g/dL (ref 6.5–8.1)

## 2021-10-31 LAB — LIPID PANEL
Cholesterol: 124 mg/dL (ref 0–200)
HDL: 47 mg/dL (ref >40)
LDL Cholesterol: 69 mg/dL (ref 0–99)
Total CHOL/HDL Ratio: 2.6 RATIO
Triglycerides: 38 mg/dL (ref <150)
VLDL: 8 mg/dL (ref 0–40)

## 2021-10-31 LAB — RESP PANEL BY RT-PCR (FLU A&B, COVID) ARPGX2
Influenza A by PCR: NEGATIVE
Influenza B by PCR: NEGATIVE
SARS Coronavirus 2 by RT PCR: NEGATIVE

## 2021-10-31 LAB — TSH: TSH: 3.314 u[IU]/mL (ref 0.350–4.500)

## 2021-10-31 MED ORDER — MIRTAZAPINE 7.5 MG PO TABS
7.5000 mg | ORAL_TABLET | Freq: Every day | ORAL | Status: DC
Start: 2021-10-31 — End: 2022-08-14

## 2021-10-31 MED ORDER — OLANZAPINE 20 MG PO TABS: 20.0000 mg | ORAL_TABLET | Freq: Every day | ORAL | Status: DC

## 2021-10-31 MED ORDER — POTASSIUM CHLORIDE CRYS ER 20 MEQ PO TBCR
20.0000 meq | EXTENDED_RELEASE_TABLET | Freq: Once | ORAL | Status: AC
  Administered 2021-10-31: 20 meq via ORAL
  Filled 2021-10-31: qty 1

## 2021-10-31 NOTE — ED Notes (Signed)
Pt had lunch and now he is in the shower ?

## 2021-10-31 NOTE — Progress Notes (Signed)
Patient has been denied by [facility] due to no appropriate beds. Patient meets BH inpatient criteria per Redge Gainer, NP. Patient has been faxed out to the following facilities:  ? ? ?[facility]  [address] [place] [address] [phone]  ?[facility]  [address] [place] [address] [phone]  ?[facility]  [address], [place] [address] [phone]  ?[facility]  [address] [place] [address] [phone]  ?[facility]  [address] [place], [place] [address] [phone]  ?[facility]  [address] [place], [place] [address] [phone]  ?[facility]  [address] [place] [address] [phone]  ?[facility]  [address] [place] [address] [phone]  ?[facility]  [address], [place] [address] [phone]  ?[facility]  [address] [place] [address] [phone]  ? ?Damita Dunnings, MSW, LCSW-A  ?12:17 PM 10/31/2021   ?

## 2021-10-31 NOTE — Progress Notes (Signed)
Patient is a alert and oriented X 4, denies SI, HI and AVH. Patient is calm, pleasant, and cooperative. Patient is logical and coherent in speech, with soft tone. Patient does pace, but states he often walks but did not want any medication for anxiety. Patient watching television at this time, awaiting plan for the day. Patient has already spoken with provider. ?

## 2021-10-31 NOTE — ED Notes (Signed)
Pt was given breakfast, he has been pacing the unit.  ?

## 2021-10-31 NOTE — ED Notes (Signed)
Pt sleeping@this time. Breathing even and unlabored. Will continue to monitor for safety 
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
## 2021-10-31 NOTE — Discharge Instructions (Addendum)
Pt transferred to [facility]. ?

## 2021-10-31 NOTE — Progress Notes (Signed)
Patient discharge to [facility]. Patient received all belongings from [facility] locker. Summary of visit given. Patient transported voluntarily by [employer]. ?

## 2021-10-31 NOTE — Progress Notes (Signed)
[facility] LCSW Progress Note ?  ?10/31/2021    2:41 PM ? ?Lawrence [name]  ? ?[id number]  ? ?Type of Contact and Topic:  Psychiatric Bed Placement  ? ?Pt accepted to [facility] ( [address], [place], [place])     ? ?Patient meets inpatient criteria per Tharon Aquas, NP  ? ?The attending provider will be Domingo Madeira, MD  ? ?Call report to [phone]   ? ?Jerry Caras, RN @ [facility] notified.    ? ?Pt scheduled  to arrive at [facility].  ? ?Mariea Clonts, MSW, LCSW-A  ?2:44 PM 10/31/2021   ?  ? ?  ?  ? ? ? ? ?  ?

## 2021-11-17 ENCOUNTER — Telehealth (HOSPITAL_COMMUNITY): Payer: Self-pay

## 2021-11-17 NOTE — BH Assessment (Signed)
Care Management - Follow Up [facility] Discharges  ? ?Patient has been placed in an inpatient psychiatric hospital [facility]) on 10-31-2021. ?

## 2022-01-12 ENCOUNTER — Other Ambulatory Visit (HOSPITAL_BASED_OUTPATIENT_CLINIC_OR_DEPARTMENT_OTHER): Payer: Self-pay

## 2022-01-12 MED ORDER — OLANZAPINE 20 MG PO TABS
20.0000 mg | ORAL_TABLET | Freq: Every evening | ORAL | 0 refills | Status: DC
  Filled 2022-01-12 – 2022-06-05 (×2): qty 90, 90d supply, fill #0

## 2022-01-12 MED ORDER — MIRTAZAPINE 7.5 MG PO TABS
7.5000 mg | ORAL_TABLET | Freq: Every evening | ORAL | 0 refills | Status: DC
  Filled 2022-01-12 – 2022-06-05 (×2): qty 90, 90d supply, fill #0

## 2022-01-19 ENCOUNTER — Other Ambulatory Visit (HOSPITAL_BASED_OUTPATIENT_CLINIC_OR_DEPARTMENT_OTHER): Payer: Self-pay

## 2022-03-15 IMAGING — CT CT HEAD W/O CM
3 of 4 series · 16 of 47 positions shown, 19 images · non-contrast
Comparison: 11/22/2016

CLINICAL DATA: Altered mental status.

EXAM:
CT HEAD WITHOUT CONTRAST
TECHNIQUE: Contiguous axial images were obtained from the base of the skull
through the vertex without intravenous contrast.

[Series 3: head 2.0 h70h · axial · 0.42mm/px · z∈[-174,-32]mm · 10 of 79 slices shown, 13 images]
[im 4/79  brain]
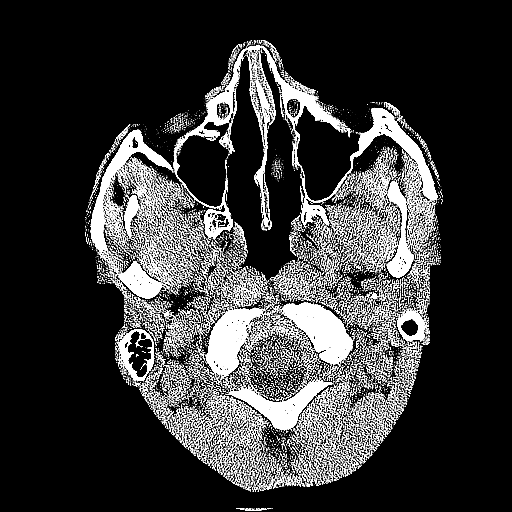
[im 4/79  bone]
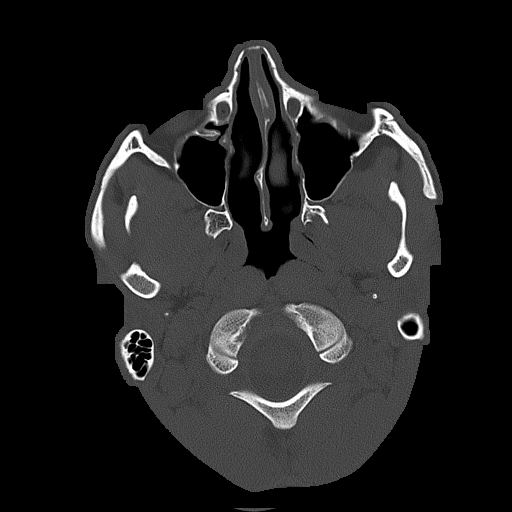
[im 12/79  brain]
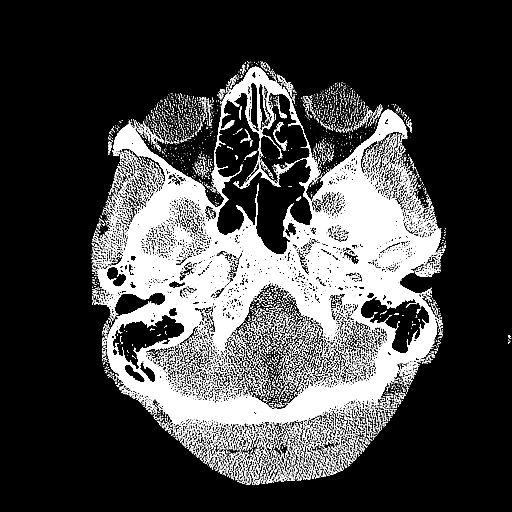
[im 20/79  brain]
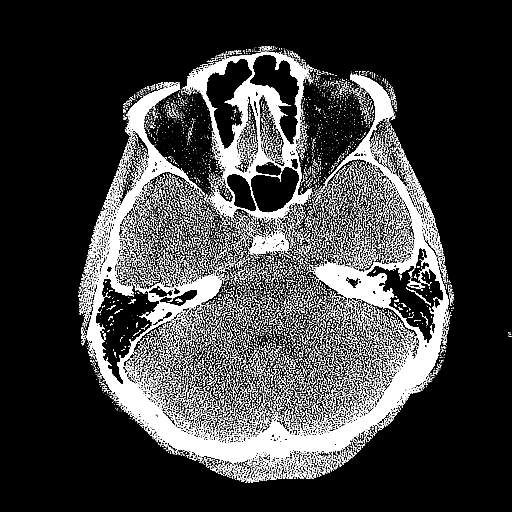
[im 28/79  brain]
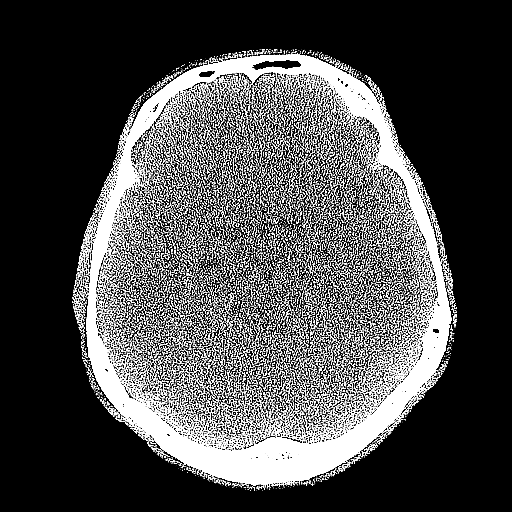
[im 36/79  brain]
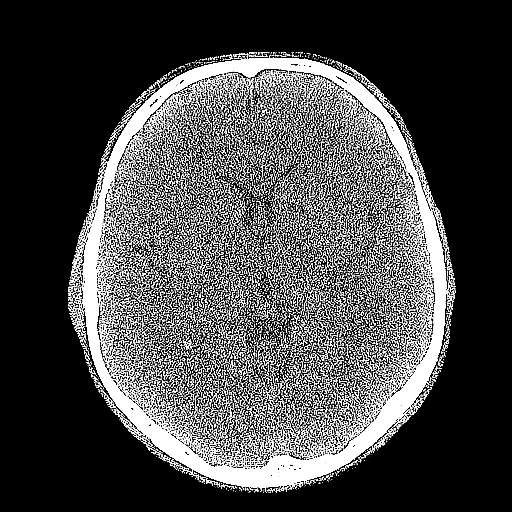
[im 36/79  bone]
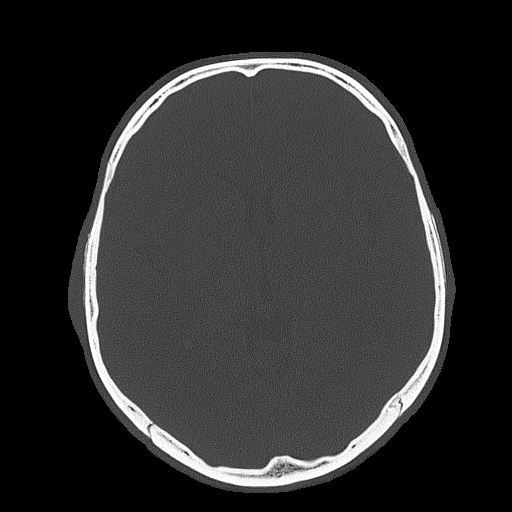
[im 43/79  brain]
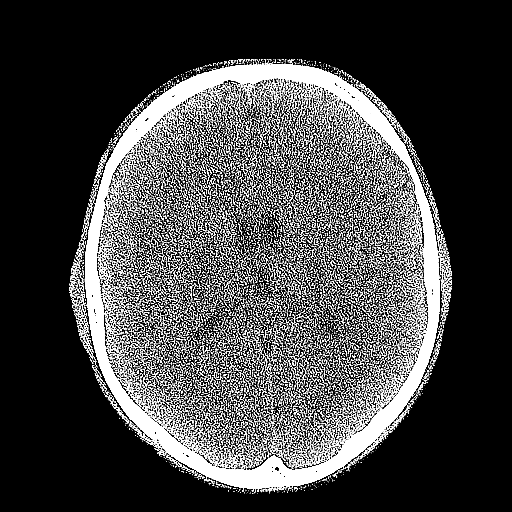
[im 51/79  brain]
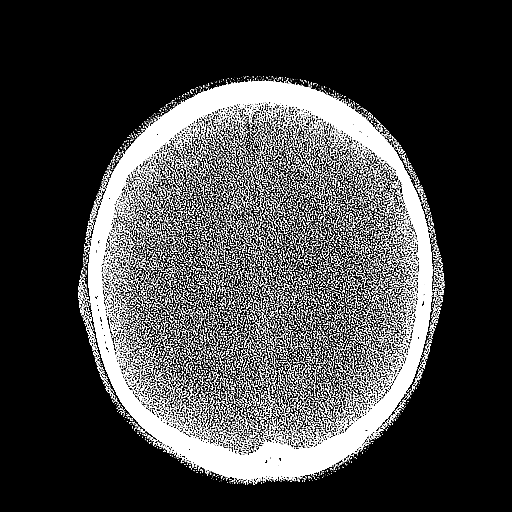
[im 59/79  brain]
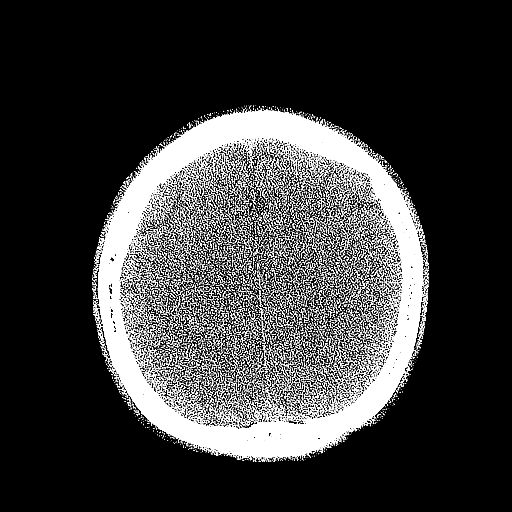
[im 67/79  brain]
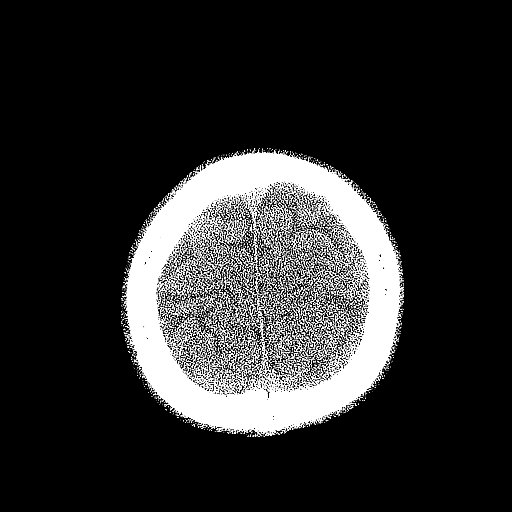
[im 67/79  bone]
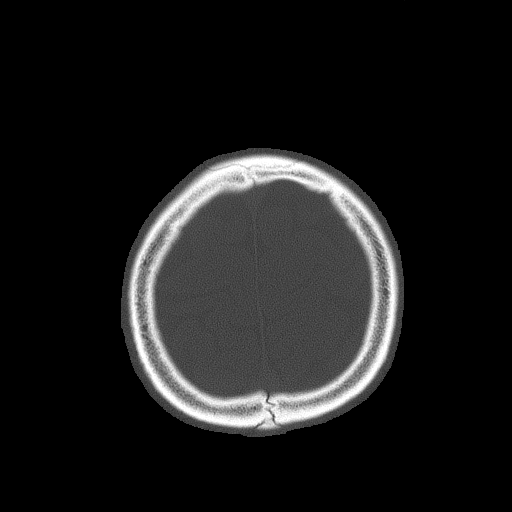
[im 75/79  brain]
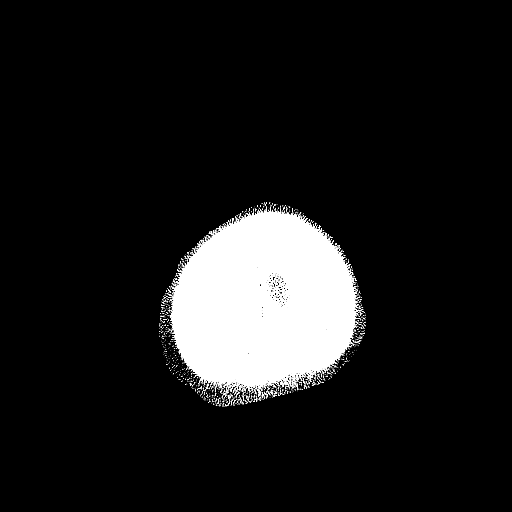

[Series 5: head 3.0 mpr cor · coronal · 0.33mm/px · 3 of 67 slices shown]
[im 23/67  brain]
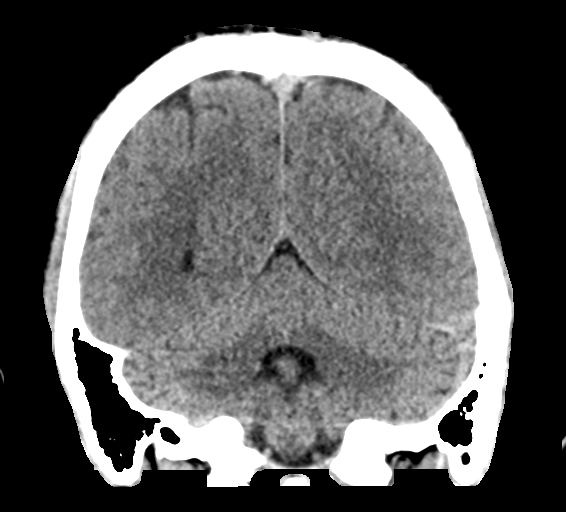
[im 30/67  brain]
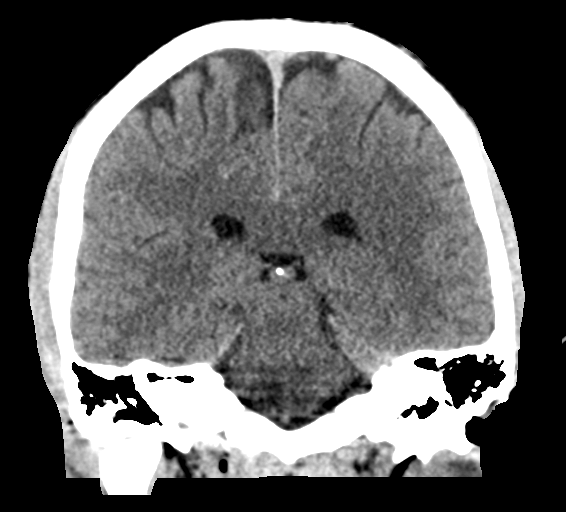
[im 37/67  brain]
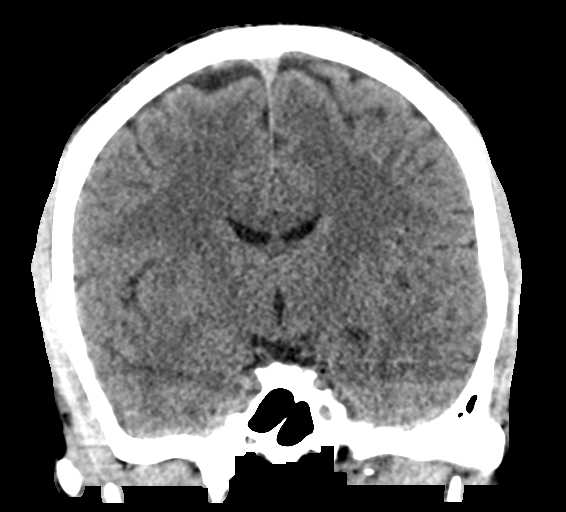

[Series 6: head 3.0 mpr sag · sagittal · 0.33mm/px · 3 of 63 slices shown]
[im 21/63  brain]
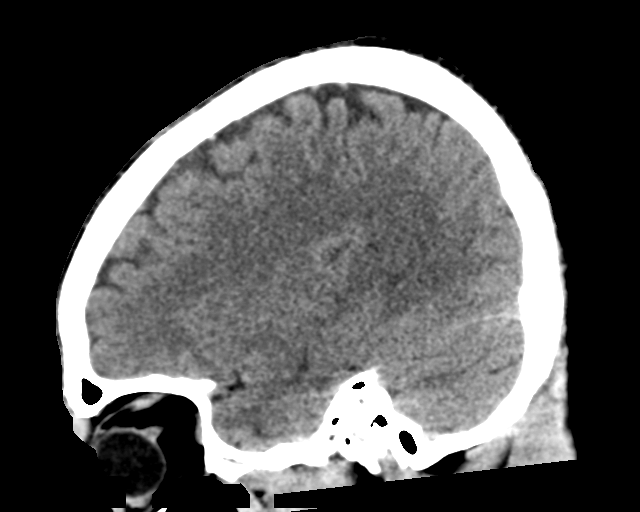
[im 32/63  brain]
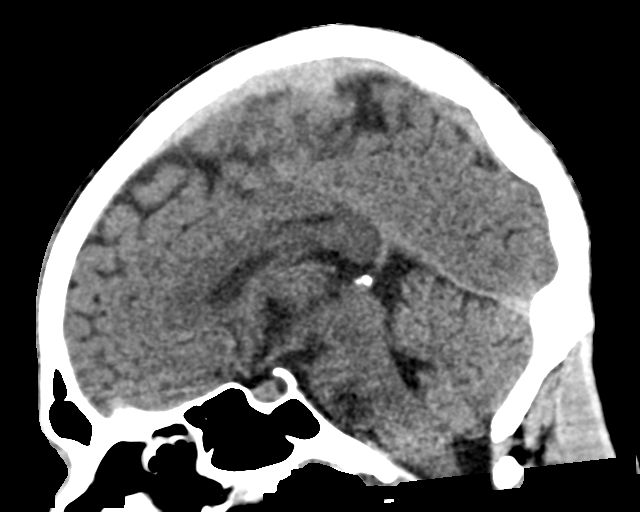
[im 42/63  brain]
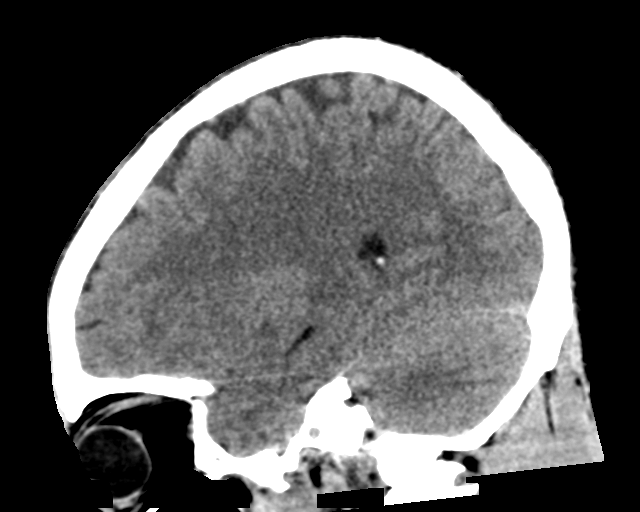

[16 of 47 positions shown; findings below may reference images not displayed]

FINDINGS: Brain: There is no evidence for acute hemorrhage, hydrocephalus,
mass lesion, or abnormal extra-axial fluid collection. No definite
CT evidence for acute infarction.

Vascular: No hyperdense vessel or unexpected calcification.

Skull: Normal. Negative for fracture or focal lesion.

Sinuses/Orbits: No acute finding.

Other: None.
IMPRESSION: No acute intracranial abnormality.

## 2022-05-02 IMAGING — CR DG CHEST 2V
2 series · 2 of 2 positions shown · non-contrast
Comparison: 08/11/2015

CLINICAL DATA: Chest pain

EXAM:
CHEST - 2 VIEW

[chest pa]
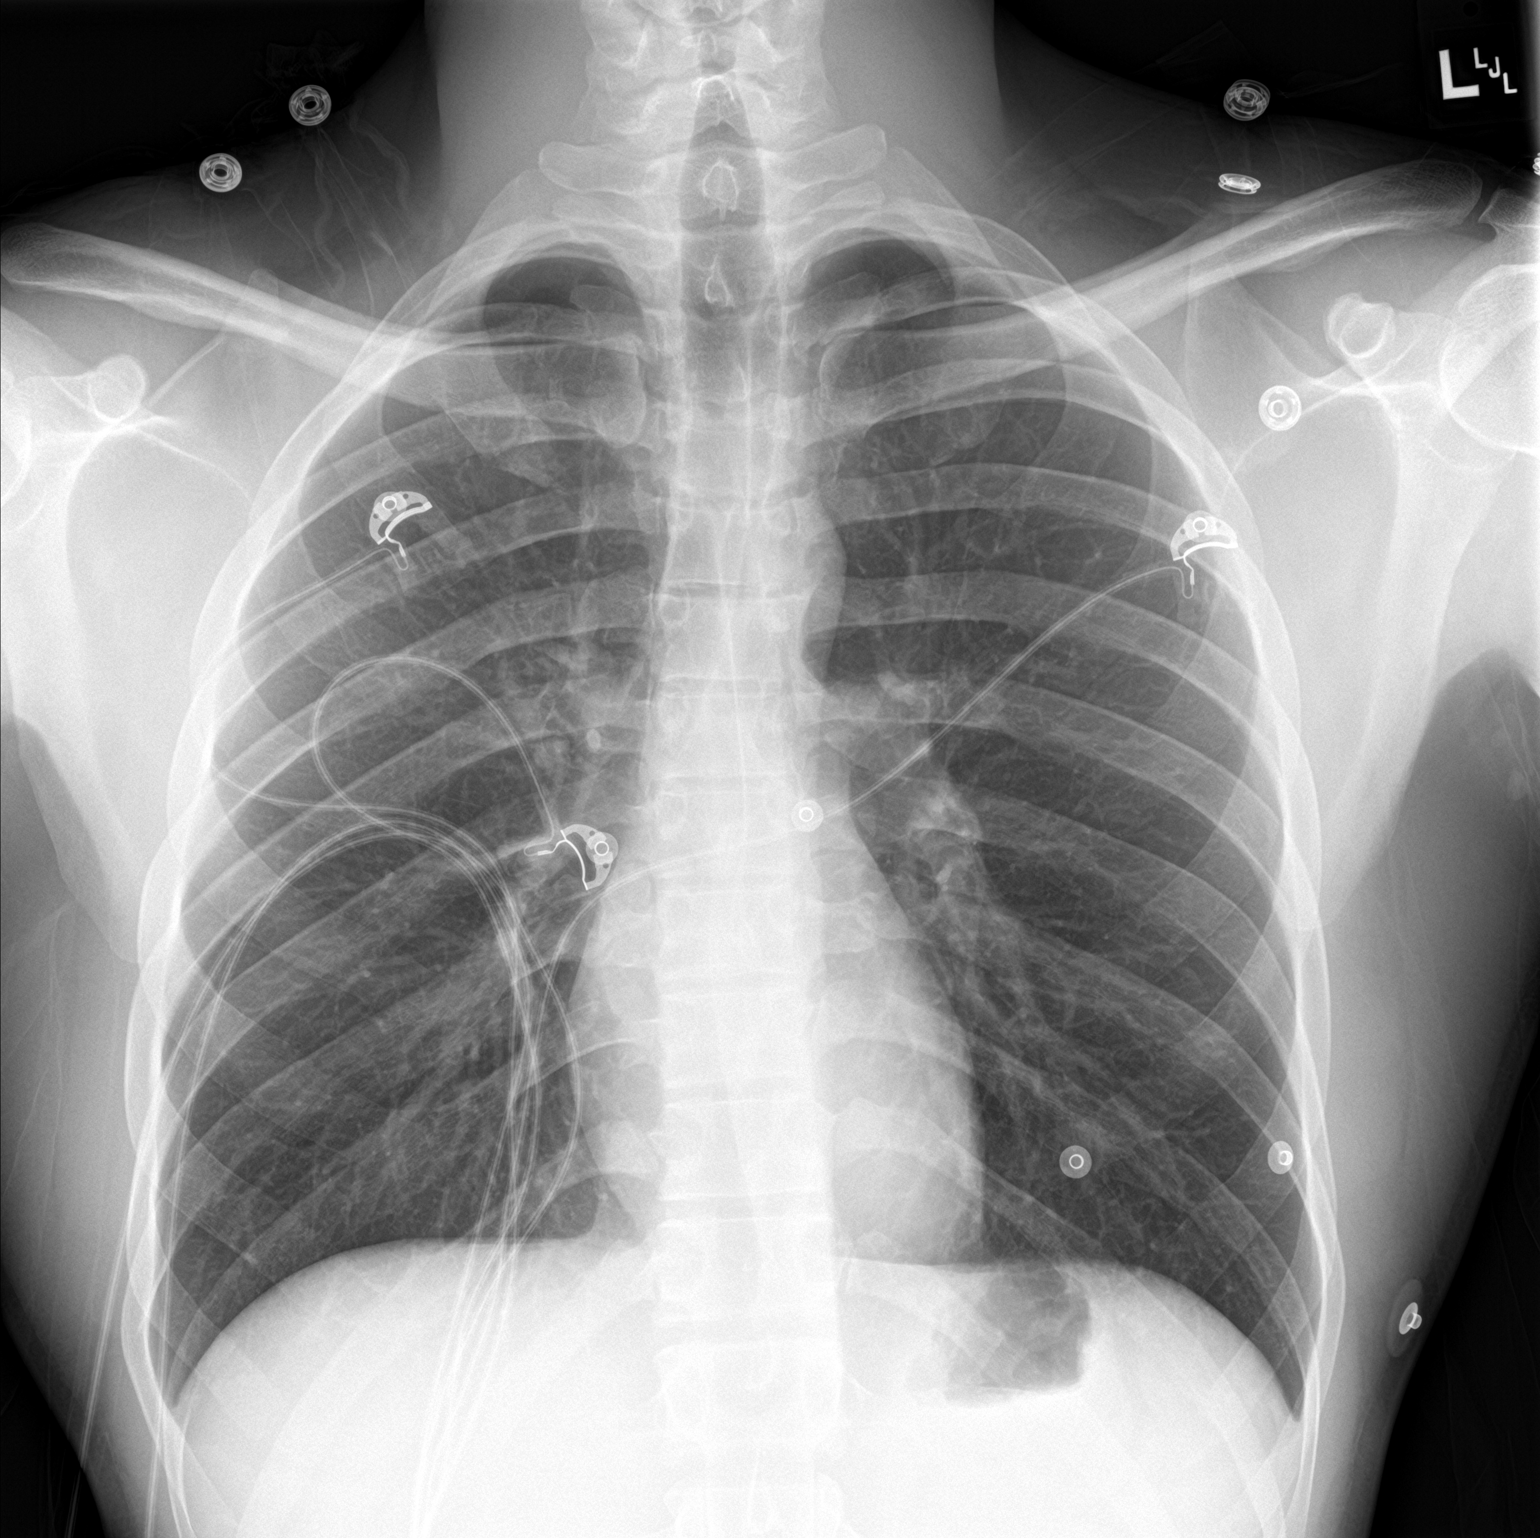

[chest lat]
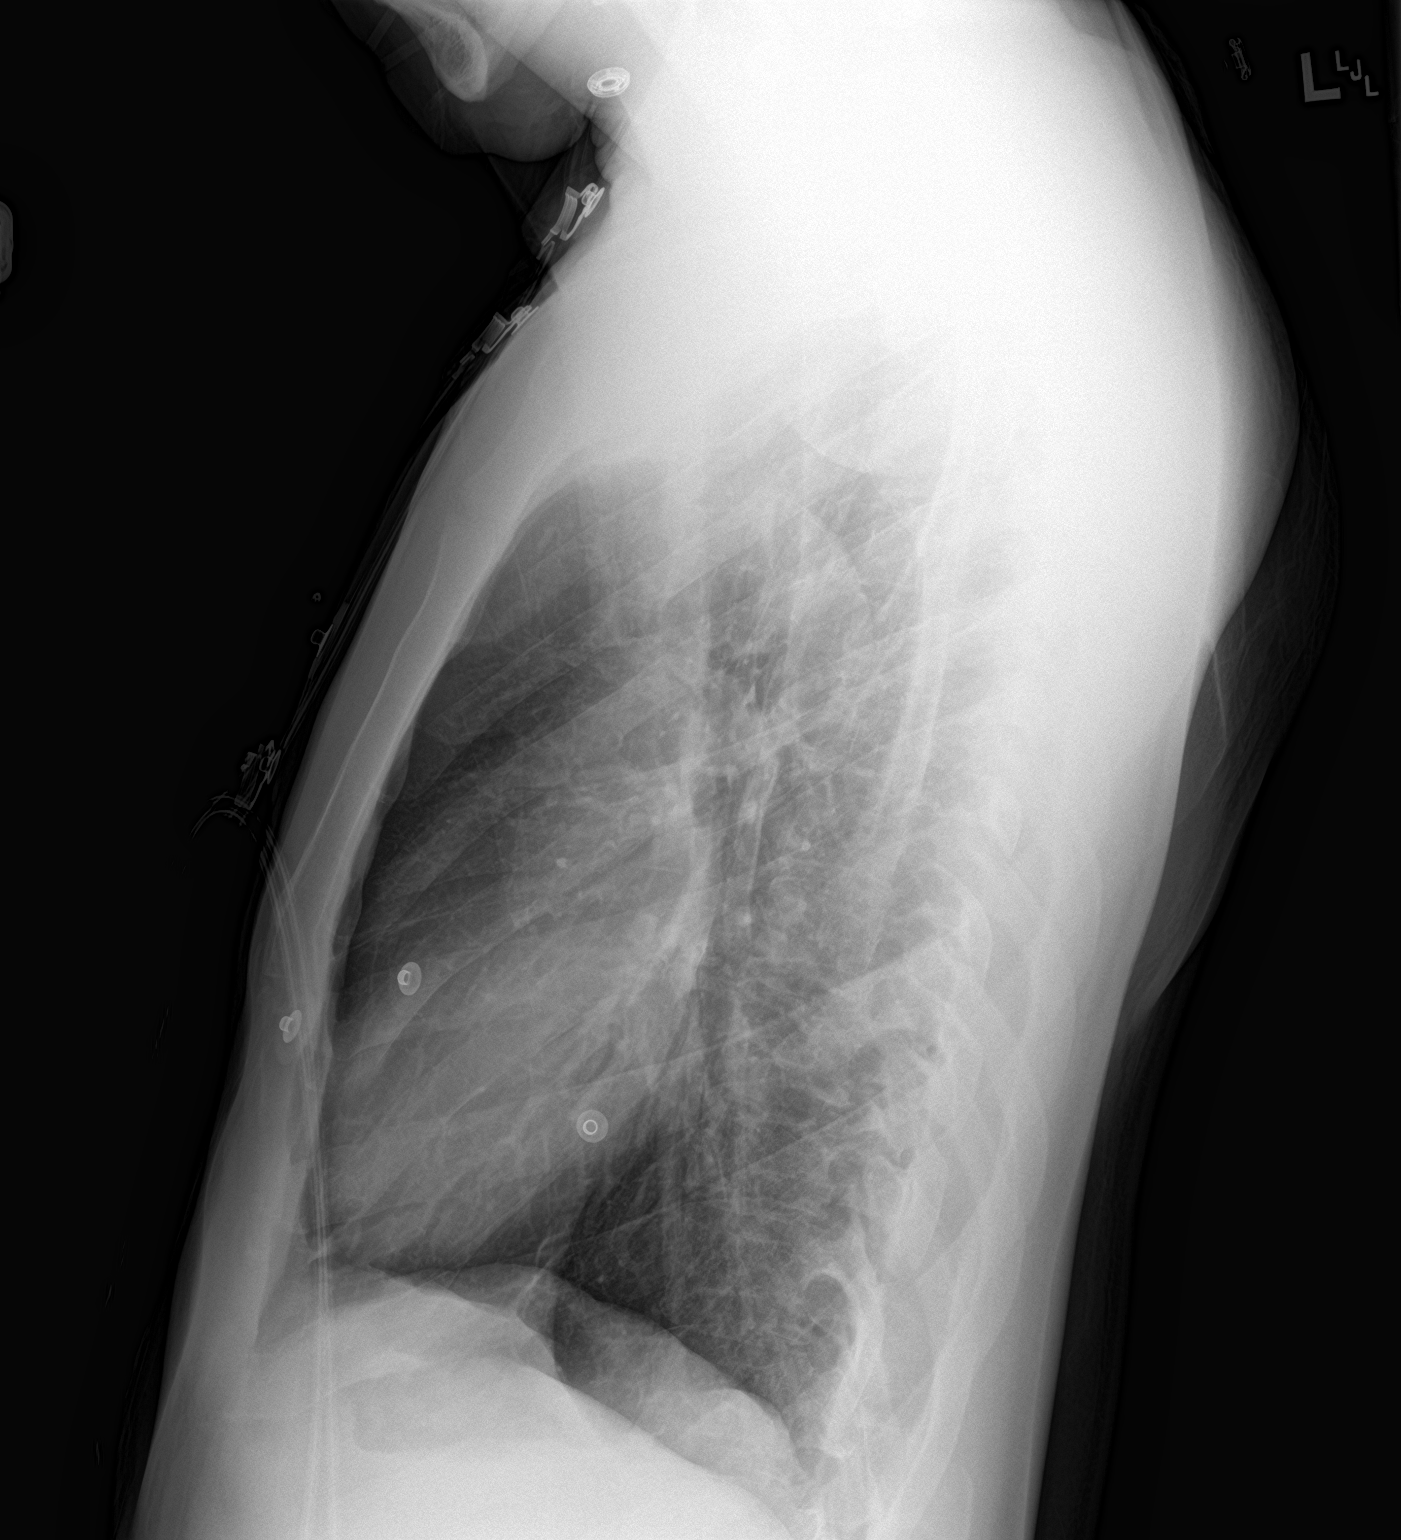

[2 of 2 positions shown; findings below may reference images not displayed]

FINDINGS: Artifact from EKG leads.

Normal heart size and mediastinal contours. No acute infiltrate or
edema. No effusion or pneumothorax. No acute osseous findings.
IMPRESSION: No active cardiopulmonary disease.

## 2022-06-05 ENCOUNTER — Other Ambulatory Visit (HOSPITAL_COMMUNITY): Payer: Self-pay

## 2022-06-06 ENCOUNTER — Other Ambulatory Visit (HOSPITAL_COMMUNITY): Payer: Self-pay

## 2022-08-13 ENCOUNTER — Ambulatory Visit (HOSPITAL_COMMUNITY)
Admission: EM | Admit: 2022-08-13 | Discharge: 2022-08-14 | Disposition: A | Discharge status: Home / Self Care | Payer: 59 | Attending: Family | Admitting: Family

## 2022-08-13 ENCOUNTER — Encounter (HOSPITAL_COMMUNITY): Payer: Self-pay

## 2022-08-13 DIAGNOSIS — Z91148 Patient's other noncompliance with medication regimen for other reason: Secondary | ICD-10-CM | POA: Diagnosis not present

## 2022-08-13 DIAGNOSIS — F250 Schizoaffective disorder, bipolar type: Secondary | ICD-10-CM | POA: Diagnosis not present

## 2022-08-13 DIAGNOSIS — F6381 Intermittent explosive disorder: Secondary | ICD-10-CM | POA: Diagnosis not present

## 2022-08-13 DIAGNOSIS — Z1152 Encounter for screening for COVID-19: Secondary | ICD-10-CM | POA: Insufficient documentation

## 2022-08-13 DIAGNOSIS — F28 Other psychotic disorder not due to a substance or known physiological condition: Secondary | ICD-10-CM | POA: Diagnosis present

## 2022-08-13 LAB — COMPREHENSIVE METABOLIC PANEL
ALT: 19 U/L (ref 0–44)
AST: 27 U/L (ref 15–41)
Albumin: 4.4 g/dL (ref 3.5–5.0)
Alkaline Phosphatase: 50 U/L (ref 38–126)
Anion gap: 13 (ref 5–15)
BUN: 11 mg/dL (ref 6–20)
CO2: 24 mmol/L (ref 22–32)
Calcium: 9.6 mg/dL (ref 8.9–10.3)
Chloride: 104 mmol/L (ref 98–111)
Creatinine, Ser: 1.06 mg/dL (ref 0.61–1.24)
GFR, Estimated: >60 mL/min (ref >60)
Glucose, Bld: 63 mg/dL — ABNORMAL LOW (ref 70–99)
Potassium: 3.4 mmol/L — ABNORMAL LOW (ref 3.5–5.1)
Sodium: 141 mmol/L (ref 135–145)
Total Bilirubin: 0.7 mg/dL (ref 0.3–1.2)
Total Protein: 6.9 g/dL (ref 6.5–8.1)

## 2022-08-13 LAB — LIPID PANEL
Cholesterol: 122 mg/dL (ref 0–200)
HDL: 52 mg/dL (ref >40)
LDL Cholesterol: 61 mg/dL (ref 0–99)
Total CHOL/HDL Ratio: 2.3 RATIO
Triglycerides: 44 mg/dL (ref <150)
VLDL: 9 mg/dL (ref 0–40)

## 2022-08-13 LAB — CBC WITH DIFFERENTIAL/PLATELET
Abs Immature Granulocytes: 0.02 10*3/uL (ref 0.00–0.07)
Basophils Absolute: 0 10*3/uL (ref 0.0–0.1)
Basophils Relative: 0 %
Eosinophils Absolute: 0.1 10*3/uL (ref 0.0–0.5)
Eosinophils Relative: 1 %
HCT: 43.9 % (ref 39.0–52.0)
Hemoglobin: 14.6 g/dL (ref 13.0–17.0)
Immature Granulocytes: 0 %
Lymphocytes Relative: 11 %
Lymphs Abs: 1.5 10*3/uL (ref 0.7–4.0)
MCH: 29.9 pg (ref 26.0–34.0)
MCHC: 33.3 g/dL (ref 30.0–36.0)
MCV: 90 fL (ref 80.0–100.0)
Monocytes Absolute: 0.8 10*3/uL (ref 0.1–1.0)
Monocytes Relative: 6 %
Neutro Abs: 10.9 10*3/uL — ABNORMAL HIGH (ref 1.7–7.7)
Neutrophils Relative %: 82 %
Platelets: 193 10*3/uL (ref 150–400)
RBC: 4.88 MIL/uL (ref 4.22–5.81)
RDW: 12.9 % (ref 11.5–15.5)
WBC: 13.4 10*3/uL — ABNORMAL HIGH (ref 4.0–10.5)
nRBC: 0 % (ref 0.0–0.2)

## 2022-08-13 LAB — RESP PANEL BY RT-PCR (RSV, FLU A&B, COVID)  RVPGX2
Influenza A by PCR: NEGATIVE
Influenza B by PCR: NEGATIVE
Resp Syncytial Virus by PCR: NEGATIVE
SARS Coronavirus 2 by RT PCR: NEGATIVE

## 2022-08-13 LAB — POCT URINE DRUG SCREEN - MANUAL ENTRY (I-SCREEN)
POC Amphetamine UR: NOT DETECTED
POC Buprenorphine (BUP): NOT DETECTED
POC Cocaine UR: NOT DETECTED
POC Marijuana UR: POSITIVE — AB
POC Methadone UR: NOT DETECTED
POC Methamphetamine UR: NOT DETECTED
POC Morphine: NOT DETECTED
POC Oxazepam (BZO): NOT DETECTED
POC Oxycodone UR: NOT DETECTED
POC Secobarbital (BAR): NOT DETECTED

## 2022-08-13 LAB — TSH: TSH: 1.547 u[IU]/mL (ref 0.350–4.500)

## 2022-08-13 MED ORDER — MIRTAZAPINE 7.5 MG PO TABS: 7.5000 mg | ORAL_TABLET | Freq: Once | ORAL | Status: DC

## 2022-08-13 MED ORDER — ALUM & MAG HYDROXIDE-SIMETH 200-200-20 MG/5ML PO SUSP: 30.0000 mL | ORAL | Status: DC | PRN

## 2022-08-13 MED ORDER — OLANZAPINE 5 MG PO TABS
5.0000 mg | ORAL_TABLET | Freq: Every day | ORAL | Status: DC
  Administered 2022-08-13: 5 mg via ORAL
  Filled 2022-08-13: qty 1

## 2022-08-13 MED ORDER — MAGNESIUM HYDROXIDE 400 MG/5ML PO SUSP: 30.0000 mL | Freq: Every day | ORAL | Status: DC | PRN
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
## 2022-08-13 NOTE — ED Provider Notes (Signed)
[facility] Urgent Care Continuous Assessment Admission H&P  Date: 08/13/22 Patient Name: Lawrence [name] MRN: [id number] Chief Complaint: No chief complaint on file.     Diagnoses:  Final diagnoses:  Schizoaffective disorder, bipolar type (HCC)  Non compliance w medication regimen    HPI: Lawrence [name] is a 26 year old African-American male that presents to [facility] urgent care accompanied by [employer].  It was reported that patient had a verbal altercation between he and his grandmother. "  She is just a stupid fucking idiot."  He denied suicidal or homicidal ideations.  Denied auditory or visual hallucinations.  It was reported that patient's mother Lawrence [name] is seeking involuntary commitment.  Spoke to patient's mother regarding additional collateral who verbalized the same scenario.  States he has been verbally aggressive and not compliant with medications.  States that he is unable to stay at his grandmother's house due to a 50B.  She reports the police department provided him an option to follow-up with [facility] urgent care facility or jail for violating no trespass order.  Lawrence [name] has a charted history with its affective disorder, bipolar type.  Schizophrenia, intermittent explosive disorder and depression.  He reports he is followed by psychiatrist [name] and stated that he has not been taking medications as indicated.  Denies that he has kept his follow-up appointments.  Recommended overnight observation Will restart Zyprexa 5 mg p.o. nightly and Remeron 7.5 mg.  Patient was receptive to plan.  In hopes that he is able to return to his grandmother's house on 08/14/2022.   During evaluation Lawrence [name] is sitting  in no acute distress he is disheveled and malodorous. he is alert/oriented x 4; calm/cooperative; and mood congruent with affect. he is speaking in a clear tone at moderate volume, and normal pace; with good eye contact. His thought process is  coherent and relevant; There is no indication that he is currently responding to internal/external stimuli or experiencing delusional thought content; and he has denied suicidal/self-harm/homicidal ideation, psychosis, and paranoia.  Patient has remained calm throughout assessment and has answered questions appropriately.     PHQ 2-9:   Flowsheet Row ED from 08/13/2022 in [facility] ED from 10/30/2021 in [facility] ED from 10/01/2021 in [facility] EMERGENCY DEPARTMENT  C-SSRS RISK CATEGORY No Risk No Risk No Risk        Total Time spent with patient: 15 minutes  Musculoskeletal  Strength & Muscle Tone: within normal limits Gait & Station: normal Patient leans: N/A  Psychiatric Specialty Exam  Presentation General Appearance:  Disheveled  Eye Contact: Good  Speech: Clear and Coherent  Speech Volume: Normal  Handedness: Right   Mood and Affect  Mood: Irritable  Affect: Congruent   Thought Process  Thought Processes: Coherent  Descriptions of Associations:Intact  Orientation:Full (Time, Place and Person)  Thought Content:Logical  Diagnosis of Schizophrenia or Schizoaffective disorder in past: No data recorded Duration of Psychotic Symptoms: No data recorded Hallucinations:Hallucinations: None  Ideas of Reference:None  Suicidal Thoughts:Suicidal Thoughts: No  Homicidal Thoughts:Homicidal Thoughts: No   Sensorium  Memory: Recent Good; Immediate Fair; Remote Fair  Judgment: Fair  Insight: Fair   Art therapist  Concentration: Fair  Attention Span: Fair  Recall: Fair  Fund of Knowledge: Fair  Language: Good   Psychomotor Activity  Psychomotor Activity:Psychomotor Activity: Normal   Assets  Assets: Desire for Improvement; Housing; Social Support   Sleep  Sleep:Sleep: Fair   Nutritional  Assessment (For OBS and FBC admissions only) Has the  patient had a weight loss or gain of 10 pounds or more in the last 3 months?: No Has the patient had a decrease in food intake/or appetite?: No Does the patient have dental problems?: No Does the patient have eating habits or behaviors that may be indicators of an eating disorder including binging or inducing vomiting?: No Has the patient recently lost weight without trying?: 0 Has the patient been eating poorly because of a decreased appetite?: 0 Malnutrition Screening Tool Score: 0    Physical Exam Vitals and nursing note reviewed.  Cardiovascular:     Rate and Rhythm: Normal rate and regular rhythm.  Neurological:     Mental Status: He is alert.  Psychiatric:        Mood and Affect: Mood normal.        Thought Content: Thought content normal.    Review of Systems  Eyes: Negative.   Cardiovascular: Negative.   Gastrointestinal: Negative.   Psychiatric/Behavioral:  Positive for substance abuse. Negative for depression. The patient is nervous/anxious. The patient does not have insomnia.   All other systems reviewed and are negative.   Blood pressure 123/75, pulse 97, temperature 98.7 F (37.1 C), temperature source Oral, resp. rate 18, SpO2 99 %. There is no height or weight on file to calculate BMI.  Past Psychiatric History: Schizophrenia, schizoaffective disorder bipolar type charted history for his prescription medications with Zyprexa and Remeron  Is the patient at risk to self? No  Has the patient been a risk to self in the past 6 months? No .    Has the patient been a risk to self within the distant past? No   Is the patient a risk to others? No   Has the patient been a risk to others in the past 6 months? No   Has the patient been a risk to others within the distant past? No   Past Medical History:  Past Medical History:  Diagnosis Date   ADD (attention deficit disorder)    Asthma    Depressed    Schizoaffective disorder (HCC)    No past surgical history on  file.  Family History: No family history on file.  Social History:  Social History   Socioeconomic History   Marital status: Single    Spouse name: Not on file   Number of children: Not on file   Years of education: Not on file   Highest education level: Not on file  Occupational History   Occupation: Unemployed  Tobacco Use   Smoking status: Some Days    Years: 0.00    Types: Cigarettes   Smokeless tobacco: Never  Vaping Use   Vaping Use: Never used  Substance and Sexual Activity   Alcohol use: Not Currently   Drug use: Yes    Frequency: 2.0 times per week    Types: Marijuana   Sexual activity: Never  Other Topics Concern   Not on file  Social History Narrative   Pt is unemployed; lives with grandmother   Social Determinants of Health   Financial Resource Strain: Not on file  Food Insecurity: Not on file  Transportation Needs: Not on file  Physical Activity: Not on file  Stress: Not on file  Social Connections: Not on file  Intimate Partner Violence: Not on file    SDOH:  SDOH Screenings   Alcohol Screen: Low Risk  (11/12/2019)  Tobacco Use: High Risk (10/01/2021)  Last Labs:  No visits with results within 6 Month(s) from this visit.  Latest known visit with results is:  Admission on 10/30/2021, Discharged on 10/31/2021  Component Date Value Ref Range Status   SARS Coronavirus 2 by RT PCR 10/30/2021 NEGATIVE  NEGATIVE Final   Comment: (NOTE) SARS-CoV-2 target nucleic acids are NOT DETECTED.  The SARS-CoV-2 RNA is generally detectable in upper respiratory specimens during the acute phase of infection. The lowest concentration of SARS-CoV-2 viral copies this assay can detect is 138 copies/mL. A negative result does not preclude SARS-Cov-2 infection and should not be used as the sole basis for treatment or other patient management decisions. A negative result may occur with  improper specimen collection/handling, submission of specimen other than  nasopharyngeal swab, presence of viral mutation(s) within the areas targeted by this assay, and inadequate number of viral copies(<138 copies/mL). A negative result must be combined with clinical observations, patient history, and epidemiological information. The expected result is Negative.  Fact Sheet for Patients:  EntrepreneurPulse.com.au  Fact Sheet for Healthcare Providers:  IncredibleEmployment.be  This test is no                          t yet approved or cleared by the [place] FDA and  has been authorized for detection and/or diagnosis of SARS-CoV-2 by FDA under an Emergency Use Authorization (EUA). This EUA will remain  in effect (meaning this test can be used) for the duration of the COVID-19 declaration under Section 564(b)(1) of the Act, 21 U.S.C.section 360bbb-3(b)(1), unless the authorization is terminated  or revoked sooner.       Influenza A by PCR 10/30/2021 NEGATIVE  NEGATIVE Final   Influenza B by PCR 10/30/2021 NEGATIVE  NEGATIVE Final   Comment: (NOTE) The Xpert Xpress SARS-CoV-2/FLU/RSV plus assay is intended as an aid in the diagnosis of influenza from Nasopharyngeal swab specimens and should not be used as a sole basis for treatment. Nasal washings and aspirates are unacceptable for Xpert Xpress SARS-CoV-2/FLU/RSV testing.  Fact Sheet for Patients: EntrepreneurPulse.com.au  Fact Sheet for Healthcare Providers: IncredibleEmployment.be  This test is not yet approved or cleared by the [place] FDA and has been authorized for detection and/or diagnosis of SARS-CoV-2 by FDA under an Emergency Use Authorization (EUA). This EUA will remain in effect (meaning this test can be used) for the duration of the COVID-19 declaration under Section 564(b)(1) of the Act, 21 U.S.C. section 360bbb-3(b)(1), unless the authorization is terminated or revoked.  Performed at [facility], [place] [address] [place], [place] [address]    SARS Coronavirus 2 Ag 10/30/2021 Negative  Negative Preliminary   WBC 10/30/2021 10.6 (H)  4.0 - 10.5 K/uL Final   RBC 10/30/2021 5.08  4.22 - 5.81 MIL/uL Final   Hemoglobin 10/30/2021 15.6  13.0 - 17.0 g/dL Final   HCT 10/30/2021 45.3  39.0 - 52.0 % Final   MCV 10/30/2021 89.2  80.0 - 100.0 fL Final   MCH 10/30/2021 30.7  26.0 - 34.0 pg Final   MCHC 10/30/2021 34.4  30.0 - 36.0 g/dL Final   RDW 10/30/2021 12.8  11.5 - 15.5 % Final   Platelets 10/30/2021 223  150 - 400 K/uL Final   nRBC 10/30/2021 0.0  0.0 - 0.2 % Final   Neutrophils Relative % 10/30/2021 62  % Final   Neutro Abs 10/30/2021 6.6  1.7 - 7.7 K/uL Final   Lymphocytes Relative 10/30/2021 28  %  Final   Lymphs Abs 10/30/2021 3.0  0.7 - 4.0 K/uL Final   Monocytes Relative 10/30/2021 7  % Final   Monocytes Absolute 10/30/2021 0.7  0.1 - 1.0 K/uL Final   Eosinophils Relative 10/30/2021 2  % Final   Eosinophils Absolute 10/30/2021 0.2  0.0 - 0.5 K/uL Final   Basophils Relative 10/30/2021 1  % Final   Basophils Absolute 10/30/2021 0.1  0.0 - 0.1 K/uL Final   Immature Granulocytes 10/30/2021 0  % Final   Abs Immature Granulocytes 10/30/2021 0.03  0.00 - 0.07 K/uL Final   Performed at [facility], [place] [address], [place], [place] [address]   Sodium 10/30/2021 139  135 - 145 mmol/L Final   Potassium 10/30/2021 3.3 (L)  3.5 - 5.1 mmol/L Final   Chloride 10/30/2021 103  98 - 111 mmol/L Final   CO2 10/30/2021 26  22 - 32 mmol/L Final   Glucose, Bld 10/30/2021 89  70 - 99 mg/dL Final   Glucose reference range applies only to samples taken after fasting for at least 8 hours.   BUN 10/30/2021 6  6 - 20 mg/dL Final   Creatinine, Ser 10/30/2021 1.07  0.61 - 1.24 mg/dL Final   Calcium 10/30/2021 9.5  8.9 - 10.3 mg/dL Final   Total Protein 10/30/2021 7.1  6.5 - 8.1 g/dL Final   Albumin 10/30/2021 4.3  3.5 - 5.0 g/dL Final   AST 10/30/2021 30  15 - 41 U/L Final   ALT  10/30/2021 22  0 - 44 U/L Final   Alkaline Phosphatase 10/30/2021 69  38 - 126 U/L Final   Total Bilirubin 10/30/2021 1.0  0.3 - 1.2 mg/dL Final   GFR, Estimated 10/30/2021 >60  >60 mL/min Final   Comment: (NOTE) Calculated using the CKD-EPI Creatinine Equation (2021)    Anion gap 10/30/2021 10  5 - 15 Final   Performed at [facility] [address], [place], [place] [address]   POC Amphetamine UR 10/30/2021 None Detected  NONE DETECTED (Cut Off Level 1000 ng/mL) Final   POC Secobarbital (BAR) 10/30/2021 None Detected  NONE DETECTED (Cut Off Level 300 ng/mL) Final   POC Buprenorphine (BUP) 10/30/2021 None Detected  NONE DETECTED (Cut Off Level 10 ng/mL) Final   POC Oxazepam (BZO) 10/30/2021 None Detected  NONE DETECTED (Cut Off Level 300 ng/mL) Final   POC Cocaine UR 10/30/2021 None Detected  NONE DETECTED (Cut Off Level 300 ng/mL) Final   POC Methamphetamine UR 10/30/2021 None Detected  NONE DETECTED (Cut Off Level 1000 ng/mL) Final   POC Morphine 10/30/2021 None Detected  NONE DETECTED (Cut Off Level 300 ng/mL) Final   POC Oxycodone UR 10/30/2021 None Detected  NONE DETECTED (Cut Off Level 100 ng/mL) Final   POC Methadone UR 10/30/2021 None Detected  NONE DETECTED (Cut Off Level 300 ng/mL) Final   POC Marijuana UR 10/30/2021 Positive (A)  NONE DETECTED (Cut Off Level 50 ng/mL) Final   TSH 10/30/2021 3.314  0.350 - 4.500 uIU/mL Final   Comment: Performed by a 3rd Generation assay with a functional sensitivity of <=0.01 uIU/mL. Performed at [facility], [place] [address] [place], [place] [address]    SARSCOV2ONAVIRUS 2 AG 10/30/2021 NEGATIVE  NEGATIVE Final   Comment: (NOTE) SARS-CoV-2 antigen NOT DETECTED.   Negative results are presumptive.  Negative results do not preclude SARS-CoV-2 infection and should not be used as the sole basis for treatment or other patient management decisions, including infection  control decisions, particularly in the presence of clinical signs  and  symptoms consistent with COVID-19, or in those who have been in contact with the virus.  Negative results must be combined with clinical observations, patient history, and epidemiological information. The expected result is Negative.  Fact Sheet for Patients: HandmadeRecipes.com.cy  Fact Sheet for Healthcare Providers: FuneralLife.at  This test is not yet approved or cleared by the [place] FDA and  has been authorized for detection and/or diagnosis of SARS-CoV-2 by FDA under an Emergency Use Authorization (EUA).  This EUA will remain in effect (meaning this test can be used) for the duration of  the COV                          ID-19 declaration under Section 564(b)(1) of the Act, 21 U.S.C. section 360bbb-3(b)(1), unless the authorization is terminated or revoked sooner.     Cholesterol 10/30/2021 124  0 - 200 mg/dL Final   Triglycerides 10/30/2021 38  <150 mg/dL Final   HDL 10/30/2021 47  >40 mg/dL Final   Total CHOL/HDL Ratio 10/30/2021 2.6  RATIO Final   VLDL 10/30/2021 8  0 - 40 mg/dL Final   LDL Cholesterol 10/30/2021 69  0 - 99 mg/dL Final   Comment:        Total Cholesterol/HDL:CHD Risk Coronary Heart Disease Risk Table                     Men   Women  1/2 Average Risk   3.4   3.3  Average Risk       5.0   4.4  2 X Average Risk   9.6   7.1  3 X Average Risk  23.4   11.0        Use the calculated Patient Ratio above and the CHD Risk Table to determine the patient's CHD Risk.        ATP III CLASSIFICATION (LDL):  <100     mg/dL   Optimal  100-129  mg/dL   Near or Above                    Optimal  130-159  mg/dL   Borderline  160-189  mg/dL   High  >190     mg/dL   Very High Performed at [facility] [address], [place], [place] [address]     Allergies: Bee venom, Apple juice, Corn-containing products, Milk (cow), Other, and Peanut butter flavor  PTA Medications: (Not in a hospital  admission)   Medical Decision Making  Recommend overnight observation -Restarted Zyprexa 5 mg and Remeron 7.5 mg    -Anticipated discharge 08/14/2022.  Consider providing prescription for medications and/or encourage patient to follow-up with shot clinic for long-acting injectable    Recommendations  Based on my evaluation the patient does not appear to have an emergency medical condition.  Derrill Center, NP 08/13/22  4:49 PM

## 2022-08-13 NOTE — ED Notes (Signed)
Pt is in the bed resting. Respirations are even and unlabored. No acute distress noted. Will continue to monitor for safety 

## 2022-08-13 NOTE — Progress Notes (Signed)
Received Lawrence [name] in the assessment room completing his assessment. He denied all of the psychiatric symptoms. The skin assessment was completed and he was relocated to the OBS area. He was oriented to his new environment and offered nourishments.

## 2022-08-13 NOTE — Discharge Instructions (Addendum)
For outpatient psychiatric services medication management.  Can come during walk in hours.     [facility]: Outpatient psychiatric Services  New Patient Assessment and Therapy Walk-in Monday thru Thursday 8:00 am first come first serve until slots are full Every Friday from 1:00 pm to 4:00 pm first come first serve until slots are full  New Patient Psychiatric Medication Management Monday thru Friday from 8:00 am to 11:00 am first come first served until slots are full  For all walk-ins we ask that you arrive by 7:15 am because patients will be seen in there order of arrival.   Availability is limited, and therefore you may not be seen on the same day that you walk in.  Our goal is to serve and meet the needs of our community to the best of our ability.     Substance Abuse Treatment Programs  Intensive Outpatient Programs [facility]     [address]      [place], [place]                   [phone]       [facility] [address] [place], [place] [phone]  [facility]     (Inpatient and outpatient)     [address]           [phone]    [facility] [phone] (Suboxone and Methadone)  [address]      [place], [place] [address]      [phone]       [address] [place], [place] [phone]  [facility] (Outpatient/Inpatient, [employer])    (insurance only) [phone]             [facility] (Groups & Residential) [place], [place] [phone]     [facility]     [address]     [place], [place]      [phone]       [facility] (for caregivers and family) [phone] [address] [place], [place] [phone]      Residential Treatment Programs [facility]      [address] [place], [place] [address]  [phone]       [facility] [address] [place], [place]  [address] [phone]  [facility] [place]        [phone]       [facility] [phone]  [facility] ([facility]             [address]                                         [place], [place]                                                [phone] or [phone]                               [facility] [place] [address] [place], [address] [phone]  [facility]    [address]      [place], [place]  [phone]       [facility] [address] [place], [place] [phone]  [facility]   [address] [place], [place] [address]     [phone]      Admissions: 8am-3pm M-F  [facility] ([facility]) [address] [place], [place] [phone]  [facility]: Residential Program [phone] Days)   [place], [place]      [phone] or [phone]     [facility]: [facility] [place], [place] (Walk in Hours over the weekend or by referral)  [facility] [address] [place], [place], [place] [address] [phone]  Crisis Mobile: [facility]:  [phone] (for crisis response 24 hours a day) [facility] Hotline:      [phone] Outpatient Psychiatry and Counseling  [facility]: Mobile Crisis Management 24 hours:  [phone]  [facility] of the [employer] sliding scale fee and walk in schedule: M-F 8am-12pm/1pm-3pm [address]  [place], [place] [address] [phone]  [facility] [address] [place], [place] [address] [phone]  [facility] ([facility] [employer])- new patient walk-in appointments available Monday - Friday 8am -3pm.          [address] [place], [place] [address] [phone] or crisis line- [phone]  [facility] Services/ Intensive Outpatient Therapy Program [address] [place], [place] [address] [phone]  [facility]                  Crisis Services      [phone] [address]     [place], [place] [address]                 [facility]   [facility] [phone]. [address] [place], [place] [address]   [facility]          [address] [place]  [place], [place] [address]       [phone]  [facility] [address] [place], [place] [address] [phone]  [facility]    [address]    [place], [place] [address]     [phone]       Andee Poles, MD     [address]     [place] [address]     [phone]       [facility] [address] [place] [address]  [facility]     [address] [place], [place]      [phone]       [facility] Eulogio Ditch, MD [address] [place], [place] [address] [phone]  [facility]     [address]     [place], [place] [address]     [phone]       [facility] [address] [place], [place] [address] [phone] Resources for Temporary Residential Assistance/Crisis Centers  DAY CENTERS [facility]) M-F 8am-3pm   [address] [place], [place] [address]   [phone] Services include: laundry, barbering, support groups, case management, phone  & computer access, showers, AA/NA mtgs, mental health/substance abuse nurse, job skills class, disability information, VA assistance, spiritual classes, etc.   HOMELESS SHELTERS  [employer]  [facility]   [address] [place]     [phone]              [employer] (women and children)       [address] [place], [place] [address] [phone] [email or web address] for application and  process Application Required  [facility]   [address]    [place] [address]     [phone]                    [facility] [place] [address] [place], [place] [address] [phone] application appt.) Application Required  [facility] (women only)    [address]     [place], [place] [address]     [phone]      Intake starts 6pm daily Need valid ID, SSC, & Police report [employer] [address] [place], [place] [phone] Application Required  [employer] (men only)     [address]      [place], [place]     [phone]       [facility] of the [place] (Pregnant women only) [address] [place], [place] [phone]  [facility]      [address]      [place], [place] [address]     [phone]             [facility] [address] [place], [place] [phone] 90 day commitment/SA/Application process  [facility](men only)     [address]     [place], [place]     [phone]       Check-in at [facility] [address] [place], [place] [address] [phone] Men/Women/Women and Children must be there by 7 pm  [facility] [place], [place] [phone]

## 2022-08-13 NOTE — BH Assessment (Addendum)
Comprehensive Clinical Assessment (CCA) Note  08/13/2022 DEIONTE [name] [id number] DISPOSITION: Lawrence Rumpf NP recommends patient to continuous assessment where he will have his medications re-started and be monitored.   [place] ED from 08/13/2022 in [facility] ED from 10/30/2021 in [facility] ED from 10/01/2021 in [place] No Risk No Risk No Risk       Patient is a 26 year old male that presents voluntary this date to [facility] by [employer] after a verbal altercation with his grandmother earlier this date. Patient denies any S/I, H/I or AVH on arrival. Patient per chart, has a history significant for Schizoaffective Disorder and has been receiving OP services from [facility] MD who assists with medication management having a history significant for Schizoaffective Disorder. Patient reports he has not met with that provider in over two months and has not been complaint with his medication regimen.Patient reports that he got into a verbal altercation earlier this date with his grandmother while at her residence. Patient renders limited history in reference to the events that transpired prior to arrival stating, "She is just a fu#king idiot." Patient denies there was any physical altercation between the two. Patient currently resides with his mother although per collateral obtained at the time of triage with patient's mother a 50B has been obtained by the grandmother and patient will not be allowed to return to that residence. At the time of this note [employer] had reported mother was in the process of obtaining an IVC although after speaking to grandmother Lewis NP felt patient did not meet criteria for an IVC as patient denied any of the above S/I, H/I or AVH and presented with an appropriate although patient is willing to stay overnight to have medications re-started. Lewis NP spoke with magistrate who  also felt presenting criteria did not support an IVC, patient is voluntary at this time. Patient denies any SA use although per chart review has a history of cannabis use.           Lewis NP evaluated patient with this Probation officer and also writes this date:   Lawrence [name] is a 26 year old African-American male that presents to [facility] urgent care accompanied by [employer]  It was reported that patient had a verbal altercation between he and his grandmother. "  She is just a stupid fucking idiot."  He denied suicidal or homicidal ideations.  Denied auditory or visual hallucinations.  It was reported that patient's mother Lawrence [name] is seeking involuntary commitment.  Spoke to patient's mother regarding additional collateral who verbalized the same scenario.  States he has been verbally aggressive and not compliant with medications.  States that he is unable to stay at his grandmother's house due to a [place].  She reports the police department provided him an option to follow-up with [facility] urgent care facility or jail for violating no trespass order.   Petra has a charted history with its affective disorder, bipolar type.  Schizophrenia, intermittent explosive disorder and depression.  He reports he is followed by psychiatrist [name] and stated that he has not been taking medications as indicated.  Denies that he has kept his follow-up appointments.  Recommended overnight observation Will restart Zyprexa 5 mg p.o. nightly and Remeron 7.5 mg.  Patient was receptive to plan.  In hopes that he is able to return to his grandmother's house on 08/14/2022.    During evaluation Lawrence Bicker  [name] is sitting  in no acute distress he is disheveled and malodorous. he is alert/oriented x 4; calm/cooperative; and mood congruent with affect. he is speaking in a clear tone at moderate volume, and normal pace; with good eye contact. His thought process is coherent and relevant; There is no  indication that he is currently responding to internal/external stimuli or experiencing delusional thought content; and he has denied suicidal/self-harm/homicidal ideation, psychosis, and paranoia.  Patient has remained calm throughout assessment and has answered questions appropriately.       Chief Complaint: No chief complaint on file.  Visit Diagnosis: Schizoaffective Disorder      CCA Screening, Triage and Referral (STR)  Patient Reported Information How did you hear about [place]? Family/Friend  What Is the Reason for Your Visit/Call Today? Patient presents by [employer] after a verbal altercation with a family member earlier this date. Patient denies any S/I, H/I or AVH on arrival.  How Long Has This Been Causing You Problems? <Week  What Do You Feel Would Help You the Most Today? Medication(s)   Have You Recently Had Any Thoughts About Hurting Yourself? No  Are You Planning to Commit Suicide/Harm Yourself At This time? No   Flowsheet Row ED from 08/13/2022 in [facility] ED from 10/30/2021 in [facility] ED from 10/01/2021 in [place] No Risk No Risk No Risk       Have you Recently Had Thoughts About [place]? No  Are You Planning to Harm Someone at This Time? No  Explanation: NA   Have You Used Any Alcohol or Drugs in the Past 24 Hours? No  What Did You Use and How Much? Patient denies   Do You Currently Have a Therapist/Psychiatrist? Yes  Name of Therapist/Psychiatrist: Name of Therapist/Psychiatrist: Patient recevies OP services from [facility] MD although states he has not met with that provder in over two months. Patient states he also is prescribed medication for symptoms of Schizophrenia although currently feels he doesn't need them   Have You Been Recently Discharged From Any Office Practice or Programs? No  Explanation of Discharge From  Practice/Program: NA     CCA Screening Triage Referral Assessment Type of Contact: Face-to-Face  Telemedicine Service Delivery:   Is this Initial or Reassessment?   Date Telepsych consult ordered in [facility]:    Time Telepsych consult ordered in [facility]:    Location of Assessment: [facility] Assessment Services  Provider Location: [facility]   Collateral Involvement: Grandmother who assisted informing TTS of patient's current issues   Does Patient Have a Stage manager Guardian? No  Legal Guardian Contact Information: NA  Copy of Legal Guardianship Form: -- (NA)  Legal Guardian Notified of Arrival: -- (NA)  Legal Guardian Notified of Pending Discharge: -- (NA)  If Minor and Not Living with Parent(s), Who has Custody? NA  Is CPS involved or ever been involved? Never  Is APS involved or ever been involved? Never   Patient Determined To Be At Risk for Harm To Self or Others Based on Review of Patient Reported Information or Presenting Complaint? No  Method: No Plan  Availability of Means: No data recorded Intent: Vague intent or NA  Notification Required: No need or identified person  Additional Information for Danger to Others Potential: -- (NA)  Additional Comments for Danger to Others Potential: patient's grandmother is concerned over patient's medication non-compliance which she feels  is related to patient not meeting with his psych provider on a regular bases  Are There Guns or Other Weapons in [place]? No  Types of Guns/Weapons: NA  Are These Weapons Safely Secured?                            -- (NA)  Who Could Verify You Are Able To Have These Secured: NA  Do You Have any Outstanding Charges, Pending Court Dates, Parole/Probation? Pt denies  Contacted To Inform of Risk of Harm To Self or Others: Other: Comment (NA)    Does Patient Present under Involuntary Commitment? No    [place] of Residence: [place]   Patient Currently  Receiving the Following Services: Medication Management   Determination of Need: Urgent (48 hours)   Options For Referral: -- (Continuious assessment)     CCA Biopsychosocial Patient Reported Schizophrenia/Schizoaffective Diagnosis in Past: Yes   Strengths: Patient is currently non compliant with medications and denies any MH symptoms   Mental Health Symptoms Depression:   None (Pt denies)   Duration of Depressive symptoms:  Duration of Depressive Symptoms: N/A   Mania:   None   Anxiety:    Irritability   Psychosis:   None (Pt reports he receives negative talk from his grandmother daily.)   Duration of Psychotic symptoms:  Duration of Psychotic Symptoms: N/A   Trauma:   None   Obsessions:   None   Compulsions:   None   Inattention:   None   Hyperactivity/Impulsivity:   N/A   Oppositional/Defiant Behaviors:   Easily annoyed   Emotional Irregularity:   Intense/unstable relationships   Other Mood/Personality Symptoms:   Patient does admitt to above symptoms although feels they are unrelated to mental health and "just life"    Mental Status Exam Appearance and self-care  Stature:   Average   Weight:   Average weight   Clothing:   Disheveled (Pt has a strong BO about his person)   Grooming:   Neglected   Cosmetic use:   None   Posture/gait:   Normal   Motor activity:   Not Remarkable   Sensorium  Attention:   Normal   Concentration:   Normal   Orientation:   Object; Person; Place; Situation   Recall/memory:   Normal   Affect and Mood  Affect:   Appropriate   Mood:   Other (Comment) (Denies)   Relating  Eye contact:   Normal   Facial expression:   Responsive   Attitude toward examiner:   Cooperative   Thought and Language  Speech flow:  Clear and Coherent   Thought content:   Appropriate to Mood and Circumstances   Preoccupation:   None   Hallucinations:   None   Organization:   Goal-directed  (UTA)   Transport planner of Knowledge:   Average   Intelligence:   Average   Abstraction:   Functional   Judgement:   Good   Reality Testing:   Adequate   Insight:   Good   Decision Making:   Normal   Social Functioning  Social Maturity:   Responsible   Social Judgement:   Normal   Stress  Stressors:   Relationship; Housing   Coping Ability:   Overwhelmed   Skill Deficits:   Self-care   Supports:   Family     Religion: Religion/Spirituality Are You A Religious Person?: No How Might This Affect Treatment?: NA  Leisure/Recreation: Leisure / Recreation Do You Have Hobbies?:  (UTA)  Exercise/Diet: Exercise/Diet Do You Exercise?:  (UTA) Have You Gained or Lost A Significant Amount of Weight in the Past Six Months?: No Do You Follow a Special Diet?: No Do You Have Any Trouble Sleeping?: No   CCA Employment/Education Employment/Work Situation: Employment / Work Situation Employment Situation: Unemployed Patient's Job has Been Impacted by Current Illness: No Has Patient ever Been in Passenger transport manager?: No  Education: Education Is Patient Currently Attending School?: No Last Grade Completed: 12 Did You Nutritional therapist?: No Did You Have An Individualized Education Program (IIEP): No Did You Have Any Difficulty At [employer]?: No Patient's Education Has Been Impacted by Current Illness: No   CCA Family/Childhood History Family and Relationship History: Family history Marital status: Single Does patient have children?: No  Childhood History:  Childhood History By whom was/is the patient raised?: Mother, Grandparents Did patient suffer any verbal/emotional/physical/sexual abuse as a child?: No Did patient suffer from severe childhood neglect?: No Has patient ever been sexually abused/assaulted/raped as an adolescent or adult?: No Was the patient ever a victim of a crime or a disaster?: No Witnessed domestic violence?: No Has patient been  affected by domestic violence as an adult?: No       CCA Substance Use Alcohol/Drug Use: Alcohol / Drug Use Pain Medications: See MAR Prescriptions: See MAR Over the Counter: See MAR History of alcohol / drug use?: Yes (Pt denies any current SA issues although per chart review patient has a hx of cannabis use) Longest period of sobriety (when/how long): NA Negative Consequences of Use:  (NA) Withdrawal Symptoms: None Substance #1 Name of Substance 1: Cannabis per chart review although patient denies current use 1 - Age of First Use: UTA 1 - Amount (size/oz): UTA 1 - Frequency: UTA 1 - Duration: UTA 1 - Last Use / Amount: UTA 1 - Method of Aquiring: UTA 1- Route of Use: UTA                       ASAM's:  Six Dimensions of Multidimensional Assessment  Dimension 1:  Acute Intoxication and/or Withdrawal Potential:      Dimension 2:  Biomedical Conditions and Complications:      Dimension 3:  Emotional, Behavioral, or Cognitive Conditions and Complications:     Dimension 4:  Readiness to Change:     Dimension 5:  Relapse, Continued use, or Continued Problem Potential:     Dimension 6:  Recovery/Living Environment:     ASAM Severity Score:    ASAM Recommended Level of Treatment:     Substance use Disorder (SUD)    Recommendations for Services/Supports/Treatments:    Discharge Disposition:    DSM5 Diagnoses: Patient Active Problem List   Diagnosis Date Noted   Schizophrenia ([place]) 11/11/2019   Schizoaffective disorder, bipolar type ([place]) 01/09/2017   Intermittent explosive disorder 05/13/2016   Intoxication by drug ([place]) 07/12/2014   Intoxication 07/12/2014   Depression 07/12/2014   Overdose of antitussive      Referrals to Alternative Service(s): Referred to Alternative Service(s):   Place:   Date:   Time:    Referred to Alternative Service(s):   Place:   Date:   Time:    Referred to Alternative Service(s):   Place:   Date:   Time:    Referred to  Alternative Service(s):   Place:   Date:   Time:     Mamie Nick, LCAS

## 2022-08-13 NOTE — ED Notes (Signed)
Pt is sleeping. No distress noted. Will continue to monitor safety. 

## 2022-08-14 ENCOUNTER — Encounter (HOSPITAL_COMMUNITY): Payer: Self-pay | Admitting: Registered Nurse

## 2022-08-14 DIAGNOSIS — Z91148 Patient's other noncompliance with medication regimen for other reason: Secondary | ICD-10-CM

## 2022-08-14 DIAGNOSIS — F250 Schizoaffective disorder, bipolar type: Secondary | ICD-10-CM | POA: Diagnosis not present

## 2022-08-14 LAB — HEMOGLOBIN A1C
Hgb A1c MFr Bld: 5.8 % — ABNORMAL HIGH (ref 4.8–5.6)
Mean Plasma Glucose: 120 mg/dL

## 2022-08-14 MED ORDER — OLANZAPINE 5 MG PO TABS: 5.0000 mg | ORAL_TABLET | Freq: Every day | ORAL | 0 refills | Status: DC

## 2022-08-14 MED ORDER — MIRTAZAPINE 7.5 MG PO TABS: 7.5000 mg | ORAL_TABLET | Freq: Every day | ORAL | 0 refills | Status: DC

## 2022-08-14 NOTE — ED Provider Notes (Cosign Needed Addendum)
FBC/OBS ASAP Discharge Summary  Date and Time: 08/14/2022 4:00 PM  Name: Lawrence [name]  MRN:  [id number]   Discharge Diagnoses:  Final diagnoses:  Schizoaffective disorder, bipolar type (HCC)  Non compliance w medication regimen   HPI:  Lawrence [name] is a 26 year old male patient admitted to continuous assessment unit after presenting voluntarily via [name] to [name] as a walk in with complaints that there had been a verbal altercation between patient and his grandmother.      Lawrence [name], 31 y.o., male patient seen face to face by this provider, consulted with Dr. Nelly Rout; and chart reviewed on 08/14/22.  On evaluation PHU RECORD reports he got into an argument with his grand mother related to his mental health.  Patient states that he has a diagnosis of schizoaffective disorder but hasn't been taking medications as prescribed because he felt he did not need them.  "I felt better."  Patient states he is feeling better today.  He denies suicidal/self-harm/homicidal ideation, psychosis, and paranoia.  States he has spoken to his mother and their main concern was him getting back on his medication and [name] psychiatric [name].  States that he has talked to mother and she will be coming to pick him up.   During evaluation Lawrence [name] is sitting at foot of bed with no noted distress.  He is alert, oriented x 4, calm, cooperative and attentive.  His mood is euthymic with congruent affect.  He has normal speech, and behavior.  Objectively there is no evidence of psychosis/mania or delusional thinking.  Patient is able to converse coherently, goal directed thoughts, no distractibility, or pre-occupation.  He denies suicidal/self-harm/homicidal ideation, psychosis, and paranoia.  Patient answered question appropriately.   At this time Lawrence [name] is educated and verbalizes understanding of mental health resources and other crisis [name] in the community. He  is instructed to call 911 and present to the nearest emergency room should he experience any suicidal/homicidal ideation, auditory/visual/hallucinations, or detrimental worsening of his mental health condition.  He was a also advised by Clinical research associate that he could call the toll-free phone on back of Medicaid card to speak with care coordinator.  Resources given  Subjective: "I'm ready to go home"  Stay Summary: Lawrence [name] was admitted for Schizoaffective disorder, bipolar type [name]), crisis management, safety, and stabilization.  Medical problems were identified and treated as needed.  Home medications were restarted, adjusted, or new medications added as needed or appropriate.  Medications treated with during admission are as follows.  mirtazapine  7.5 mg Oral Once   OLANZapine  5 mg Oral QHS    Labs ordered for review: Lab Orders         Resp panel by RT-PCR (RSV, Flu A&B, Covid) Anterior Nasal Swab         CBC with Differential/Platelet         Comprehensive metabolic panel         TSH         Lipid panel         Hemoglobin A1c         Urinalysis, Routine w reflex microscopic Urine, Clean Catch         POCT Urine Drug Screen - (I-Screen)     Improvement was monitored by observation and Lawrence [name] 's verbal report emotional status and symptom reduction along with clinical staff report.  Lawrence [name] was evaluated by the treatment team for stability  and plans for continued recovery upon discharge. Lawrence [name] 's motivation was an integral factor for scheduling further treatment. Employment, transportation, bed availability, health status, family support, and any pending legal issues were also considered during stay. He was offered further treatment options upon discharge including but not limited to Residential, Intensive [name], and [name] treatment.   Lawrence [name] will follow up with  Follow-up Information     [name] Call on  08/21/2022.   Specialty: Urgent Care Why: Appointment for 08/21/2022 with Eddie for medication management.  If need to be seen sooner can go during walk in hours.  See below Contact information: [address] [place] [address] [phone]                 Discharge Instructions      For [name] psychiatric [name] medication management.  Can come during walk in hours.     Lawrence [name]: [name] psychiatric [name]  New Patient Assessment and Therapy Walk-in Monday thru Thursday 8:00 am first come first serve until slots are full Every Friday from 1:00 pm to 4:00 pm first come first serve until slots are full  New Patient Psychiatric Medication Management Monday thru Friday from 8:00 am to 11:00 am first come first served until slots are full  For all walk-ins we ask that you arrive by 7:15 am because patients will be seen in there order of arrival.   Availability is limited, and therefore you may not be seen on the same day that you walk in.  Our goal is to serve and meet the needs of our community to the best of our ability.     Substance Abuse Treatment Programs  Intensive [name] Programs [name]     [address]      Lawrence [name]                   [phone]       [name] [address] [address] [place], [name] [phone]  Lawrence [name]     (Inpatient and [name])     [address]           [phone]    Lawrence [name] [phone] (Suboxone and Methadone)  [address]      [place], [name] [address]      [phone]       [address] [place], [name] [phone]  Lawrence [name] ([name]/Inpatient, Chemical)    (insurance only) [phone]             Lawrence [name] (Groups & Residential) Lawrence [name] [phone]     [facility]     [address]     [place], [name]      [phone]       [facility] (for caregivers and family) [phone] [address] Laurell [name]. [address] [place], [name] [phone]      Residential Treatment Programs [name]      [address], [place], [name] [address]  [phone]       [facility] [address] [place], [name] [address] [phone]  [facility]        [phone]       Lawrence [name] [phone]  [facility] ([facility]             [address]  [place], [name]                                                [phone] or [phone]                               [name] [address] [place], [address] [phone]  [name]    [address]      [place], [name]     [phone]       [facility] [address] [place], [name] [phone]  [name]   [address] [place], [name] [address]     [phone]      Admissions: 8am-3pm M-F  Residential Treatment [name] (RTS) [address] [place], [name] [phone]  [facility] Program: Residential Program [phone] Days)   [place], [name]      [phone] or [phone]     [facility]: [name] [place], [name] (Walk in Hours over the weekend or by referral)  [name] [address] [place], [place], [name] [address] [phone]  Crisis Mobile: [facility]:  [phone] (for crisis response 24 hours a day) [facility] Hotline:      [phone] [name]  [facility]: Mobile Crisis Management 24 hours:  [phone]  [facility] [employer] sliding scale fee and walk in schedule: M-F 8am-12pm/1pm-3pm [address]  [place], [name] [address] [phone]  [name] [address] [place], [name] [address] [phone]  [name] ([facility])- new patient walk-in appointments available Monday - Friday 8am -3pm.          [address] [place], [name] [address] [phone] or crisis line- [phone]  [name]/ Intensive [name] Therapy Program [address] [place], [name] [address] [phone]  [name]                  Crisis [name]      [phone] [address]     [place], [name] [address]                 [facility] [phone]. [address] [place], [name] [address]   [facility]          [address]  [place], [name] [address]       [phone]  [facility] [address] [place], [name] [address] [phone]  [facility]    [address]    [place], [name] [address]     [phone]       Andee Poles, MD     [address]     [name] [address]     [phone]       [name] [address] [name] [address]  [facility]     [address]     [place], [name]      [phone]       [name] [place]io [place], [place] [address] [name] [address] [phone]  [place], [place] [address] [phone]  [facility]     [address]     [place], [place] [address]     [phone]       [facility] [address] [place], [place] [address] [phone] Resources for Temporary Residential Assistance/Crisis Centers  DAY CENTERS [name]) M-F 8am-3pm   [address] [place], [place] [address]   [phone] [name] include: laundry, barbering, support groups, case management, phone  & computer access, showers, AA/NA mtgs, mental health/substance abuse nurse, job skills class, disability information, VA assistance,  spiritual classes, etc.   HOMELESS SHELTERS  [facility]     [name]   [address], [place]     [phone]              [facility] (women and children)       [address] [place], [place] [address] [phone] [email or web address] for application and process Application Required  [facility]   [address]    [place] [address]     [phone]                    [name] [address] [place], [place] [address] [phone](schedule application appt.) Application Required  [facility] (women only)    [address]     [place], [place] [address]     [phone]      Intake starts 6pm daily Need valid ID, SSC, & Police report [facility] [place] [address] [place], [place] [phone] Application Required  [facility] (men only)     [address]      [place], [place]     [phone]       [facility] (Pregnant women only) [address] [place], [place] [phone]  [name]      [address]      [place], [place] [address]     [phone]             [facility] [address] [place], [place] [phone] 90 day commitment/SA/Application process  [facility](men only)     [address]     [place], [place]     [phone]       Check-in at [name]            C[name] [place] [facility] [address] [place], [place] [address] [phone] Men/Women/Women and Children must be there by 7 pm  [name] [place], [place] [phone]                   Upon completion of this admission Lawrence MassonCameron D Winne was both mentally and medically stable for discharge denying suicidal/homicidal ideation, auditory/visual/tactile hallucinations, delusional thoughts, and paranoia.        Total Time spent with patient: 30  minutes  Past Psychiatric History: Depression, anxiety, schizoaffective intoxication, intermittent explosive disorder  Past Medical History:  Past Medical History:  Diagnosis Date   ADD (attention deficit disorder)    Asthma    Depressed    Schizoaffective disorder (HCC)    History reviewed. No pertinent surgical history. Family History: History reviewed. No pertinent family history. Family Psychiatric History: Non reported Social History:  Social History   Substance and Sexual Activity  Alcohol Use Not Currently  Social History   Substance and Sexual Activity  Drug Use Yes   Frequency: 2.0 times per week   Types: Marijuana    Social History   Socioeconomic History   Marital status: Single    Spouse name: Not on file   Number of children: Not on file   Years of education: Not on file   Highest education level: Not on file  Occupational History   Occupation: Unemployed  Tobacco Use   Smoking status: Some Days    Years: 0.00    Types: Cigarettes   Smokeless tobacco: Never  Vaping Use   Vaping Use: Never used  Substance and Sexual Activity   Alcohol use: Not Currently   Drug use: Yes    Frequency: 2.0 times per week    Types: Marijuana   Sexual activity: Never  Other Topics Concern   Not on file  Social History Narrative   Pt is unemployed; lives with grandmother   Social Determinants of Health   Financial Resource Strain: Not on file  Food Insecurity: Not on file  Transportation Needs: Not on file  Physical Activity: Not on file  Stress: Not on file  Social Connections: Not on file   SDOH:  SDOH Screenings   Alcohol Screen: Low Risk  (11/12/2019)  Depression (PHQ2-9): Low Risk  (08/13/2022)  Tobacco Use: High Risk (08/14/2022)    Tobacco Cessation:  A prescription for an FDA-approved tobacco cessation medication was offered at discharge and the patient refused  Current Medications:  Current Facility-Administered Medications  Medication Dose  Route Frequency Provider Last Rate Last Admin   alum & mag hydroxide-simeth (MAALOX/MYLANTA) 200-200-20 MG/5ML suspension 30 mL  30 mL Oral Q4H PRN Oneta Rack, NP       magnesium hydroxide (MILK OF MAGNESIA) suspension 30 mL  30 mL Oral Daily PRN Oneta Rack, NP       mirtazapine (REMERON) tablet 7.5 mg  7.5 mg Oral Once Oneta Rack, NP       OLANZapine (ZYPREXA) tablet 5 mg  5 mg Oral QHS Oneta Rack, NP   5 mg at 08/13/22 2121   Current [name] Medications  Medication Sig Dispense Refill   mirtazapine (REMERON) 7.5 MG tablet Take 1 tablet (7.5 mg total) by mouth at bedtime. 30 tablet 0   OLANZapine (ZYPREXA) 5 MG tablet Take 1 tablet (5 mg total) by mouth at bedtime. 30 tablet 0    PTA Medications: (Not in a [name] admission)      08/13/2022    5:21 PM  Depression screen PHQ 2/9  Decreased Interest 0  Down, Depressed, Hopeless 0  PHQ - 2 Score 0    Flowsheet Row ED [name] from 10/01/2021 in [facility]  C-SSRS RISK CATEGORY No Risk No Risk No Risk       Musculoskeletal  Strength & Muscle Tone: within normal limits Gait & Station: normal Patient leans: N/A  Psychiatric Specialty Exam  Presentation  General Appearance:  Appropriate for Environment  Eye Contact: Good  Speech: Clear and Coherent; Normal Rate  Speech Volume: Normal  Handedness: Right   Mood and Affect  Mood: Euthymic  Affect: Appropriate; Congruent   Thought Process  Thought Processes: Coherent; Goal Directed  Descriptions of Associations:Intact  Orientation:Full (Time, Place and Person)  Thought Content:Logical  Diagnosis of Schizophrenia or Schizoaffective disorder in past: Yes  Duration of Psychotic Symptoms: Greater than six months   Hallucinations:Hallucinations: None  Ideas of  Reference:None  Suicidal Thoughts:Suicidal Thoughts: No  Homicidal Thoughts:Homicidal Thoughts: No   Sensorium  Memory: Immediate Good; Recent Good; Remote Fair  Judgment: Intact  Insight: Present   Executive Functions  Concentration: Good  Attention Span: Good  Recall: Good  Fund of Knowledge: Good  Language: Good   Psychomotor Activity  Psychomotor Activity: Psychomotor Activity: Normal   Assets  Assets: Communication Skills; Desire for Improvement; Housing; Physical Health; Social Support   Sleep  Sleep: Sleep: Good   Nutritional Assessment (For OBS and FBC admissions only) Has the patient had a weight loss or gain of 10 pounds or more in the last 3 months?: No Has the patient had a decrease in food intake/or appetite?: No Does the patient have dental problems?: No Does the patient have eating habits or behaviors that may be indicators of an eating disorder including binging or inducing vomiting?: No Has the patient recently lost weight without trying?: 0 Has the patient been eating poorly because of a decreased appetite?: 0 Malnutrition Screening Tool Score: 0    Physical Exam  Physical Exam Physical Exam Vitals and nursing note reviewed.  Constitutional:      General: He is not in acute distress.    Appearance: Normal appearance. He is not ill-appearing.  HENT:     Head: Normocephalic.  Eyes:     Conjunctiva/sclera: Conjunctivae normal.  Cardiovascular:     Rate and Rhythm: Normal rate.  Pulmonary:     Effort: Pulmonary effort is normal.  Musculoskeletal:        General: Normal range of motion.     Cervical back: Normal range of motion.  Skin:    General: Skin is warm and dry.  Neurological:     Mental Status: He is alert and oriented to person, place, and time.  Psychiatric:        Attention and Perception: Attention and perception normal. He does not perceive auditory or visual hallucinations.        Mood and Affect: Mood and  affect normal.        Speech: Speech normal.        Behavior: Behavior normal. Behavior is cooperative.        Thought Content: Thought content normal. Thought content is not paranoid or delusional. Thought content does not include homicidal or suicidal ideation ROS Review of Systems  Constitutional: Negative.   HENT: Negative.    Eyes: Negative.   Respiratory: Negative.    Cardiovascular: Negative.   Gastrointestinal: Negative.   Genitourinary: Negative.   Musculoskeletal: Negative.   Skin: Negative.   Neurological: Negative.   Endo/Heme/Allergies: Negative.   Psychiatric/Behavioral:  Depression: Stable. Hallucinations: Denies at this time. Substance abuse: cannabis. Suicidal ideas: Denies. Nervous/anxious: Stable. Insomnia: Sleep better.   Blood pressure 110/69, pulse (!) 59, temperature (!) 97.5 F (36.4 C), temperature source Oral, resp. rate 18, SpO2 100 %. There is no height or weight on file to calculate BMI.  Demographic Factors:  Male and Unemployed  Loss Factors: NA  Historical Factors: Impulsivity  Risk Reduction Factors:   Sense of responsibility to family, Religious beliefs about death, Living with another person, especially a relative, and Positive social support  Continued Clinical Symptoms:  Previous Psychiatric Diagnoses and Treatments  Cognitive Features That Contribute To Risk:  None    Suicide Risk:  Minimal: No identifiable suicidal ideation.  Patients presenting with no risk factors but with  morbid ruminations; may be classified as minimal risk based on the severity of the depressive symptoms  Plan Of Care/Follow-up recommendations:  Other:  Follow up with resource given  Disposition: No evidence of imminent risk to self or others at present.   Patient does not meet criteria for psychiatric inpatient admission. Supportive therapy provided about ongoing stressors. Discussed crisis plan, support from social network, calling 911, coming to the  Emergency Department, and calling Suicide Hotline.  Almendra Loria, NP 08/14/2022, 4:00 PM

## 2022-08-14 NOTE — ED Notes (Signed)
Pt presents with suspicious , paranoid mood, affect congruent. Tagg reports '' I just want to go home. I don't need to be here, it was just an argument with my family and they said I needed to get back on medication. I don't really need any medication their opinion doesn't matter. ''  Discussed with patient his reason for admission. , and pt hx. Patient reports he has been inpatient four times and does not feel he needs to go in the hospital. Patient does not show any insight into his family concerns and becomes evasive when asking questions. He is guarded and not forthcoming with answers, is noted to pace the unit continuously and states '' I just do that I'm not hearing or seeing things. '' Pt adamantly denies any SI or HI or AV Hallucinations. Aware provider may come to speak with him later. In no acute distress, accepted breakfast cereal this am. Pt is safe.

## 2022-08-14 NOTE — ED Notes (Signed)
Pt is sleeping. No distress noted. Will continue to monitor safety. 

## 2022-08-21 ENCOUNTER — Ambulatory Visit (HOSPITAL_COMMUNITY): Payer: Self-pay | Admitting: Physician Assistant

## 2022-08-21 ENCOUNTER — Encounter (HOSPITAL_COMMUNITY): Payer: Self-pay

## 2022-09-04 ENCOUNTER — Ambulatory Visit (INDEPENDENT_AMBULATORY_CARE_PROVIDER_SITE_OTHER): Payer: Self-pay | Admitting: Physician Assistant

## 2022-09-04 ENCOUNTER — Encounter (HOSPITAL_COMMUNITY): Payer: Self-pay | Admitting: Physician Assistant

## 2022-09-04 DIAGNOSIS — F250 Schizoaffective disorder, bipolar type: Secondary | ICD-10-CM

## 2022-09-04 MED ORDER — OLANZAPINE 5 MG PO TABS: 5.0000 mg | ORAL_TABLET | Freq: Every day | ORAL | 1 refills | Status: DC

## 2022-09-04 NOTE — Progress Notes (Signed)
Psychiatric Initial Adult Assessment   Patient Identification: Lawrence [name] MRN:  [id number] Date of Evaluation:  09/04/2022 Referral Source: Referred by [facility] Chief Complaint:   Chief Complaint  Patient presents with   Establish Care   Medication Refill   Other    Patient requesting to be placed on a long-acting injectable   Visit Diagnosis:    ICD-10-CM   1. Schizoaffective disorder, bipolar type ([place])  F25.0 OLANZapine (ZYPREXA) 5 MG tablet      History of Present Illness:    Lawrence [name] is a 27 year old, African-American male with a past psychiatric history significant for schizoaffective disorder bipolar type who presents to [facility], accompanied by his grandmother Lawrence [name], [phone]), to establish psychiatric care and for medication management.  Patient states that he presents today requesting to be placed on a long-acting injectable.  Patient's reason for wanting an LAI is due to his current pills being too strong and difficulty swallowing them.  Patient is currently taking the following medications: Olanzapine 5 mg at bedtime and mirtazapine 7.5 mg at bedtime.  Patient states that whenever he takes the medications, he feels lightheaded.  Patient endorses a past history of receiving a long-acting injectable.  Per patient's grandmother, patient was receiving an LAI while being seen at [facility] back in 2017/2018.  Although patient complains of lightheadedness when taking his oral medications, he states that the medications do work.  Later on during the encounter, patient admitted to not taking his olanzapine for the last 4 days.  Patient denies any major issues with his mental health at this time.  Patient denies recent episodes of depression.  He denies current anxiety at the moment but states that he felt on edge yesterday.  In addition to feeling on edge, patient states that his  heart was beating really fast due to not being able to take his medications at the time he wanted.  Patient denies any specific stressors at this time.  Patient acknowledges that he does not make the best choices at times and states that he is currently taking both marijuana and nicotine products.  A PHQ-9 screen was performed with the patient scoring a 7.  A GAD-7 screen was also performed with the patient scoring a 7.  Patient has a history of multiple ED as well as hospitalizations due to mental health.  Patient's last hospitalization occurred back in 2021.  During his discharge, patient was discharged on the following medications:  Benztropine 1 mg 2 times daily Carbamazepine 200 mg every 12 hours (1 in the morning and 2 at night) Paliperidone 234 mg/1.5 mL Susy injection Risperidone 3 mg by mouth 2 times daily  Patient is alert and oriented x 4, calm, cooperative, and engaged in conversation during the encounter.  Patient endorses good mood.  Patient denies suicidal or homicidal ideations.  He further denies auditory or visual hallucinations and does not appear to be responding to internal/external stimuli.  Patient denies paranoia or delusional thoughts.  Patient endorses poor sleep and receives on average 4 hours of intermittent sleep.  Patient endorses fair appetite and eats on average 2 meals per day.  Patient denies alcohol consumption.  Patient endorses tobacco use and smokes on average 4 to 5 cigarettes/day.  Patient endorses illicit drug use in the form of marijuana.  Associated Signs/Symptoms: Depression Symptoms:  disturbed sleep, Patient endorses some self-isolation (Hypo) Manic Symptoms:   Patient denies Anxiety Symptoms:  Panic Symptoms,  Psychotic Symptoms:   Patient denies PTSD Symptoms: Negative -- patient denies history of traumatic experiences  Past Psychiatric History:  Affective disorder (bipolar type)  Previous Psychotropic Medications: Yes   Substance Abuse History in  the last 12 months:  Yes.  ,  Patient reports that he used marijuana a few days ago  Consequences of Substance Abuse: Not noted  Past Medical History:  Past Medical History:  Diagnosis Date   ADD (attention deficit disorder)    Asthma    Depressed    Schizoaffective disorder ([place])    History reviewed. No pertinent surgical history.  Family Psychiatric History:  Patient denies family history of psychiatric illness; however, patient's grandmother states that the patient's mother saw a psychologist during her 12th grade year of high school.  Patient denies any family members being on medications at this time.  Patient denies a family history of suicide Patient denies a family history of homicide Patient denies a family history of substance abuse stating that he is the only one that uses illicit substances  Family History: History reviewed. No pertinent family history.  Social History:   Social History   Socioeconomic History   Marital status: Single    Spouse name: Not on file   Number of children: Not on file   Years of education: Not on file   Highest education level: Not on file  Occupational History   Occupation: Unemployed  Tobacco Use   Smoking status: Some Days    Years: 0.00    Types: Cigarettes   Smokeless tobacco: Never  Vaping Use   Vaping Use: Never used  Substance and Sexual Activity   Alcohol use: Not Currently   Drug use: Yes    Frequency: 2.0 times per week    Types: Marijuana   Sexual activity: Never  Other Topics Concern   Not on file  Social History Narrative   Pt is unemployed; lives with grandmother   Social Determinants of Health   Financial Resource Strain: Not on file  Food Insecurity: Not on file  Transportation Needs: Not on file  Physical Activity: Not on file  Stress: Not on file  Social Connections: Not on file    Additional Social History:  Patient denies having any social support.  Patient denies having any children of his  own.  Patient is currently living with his grandmother.  Patient denies being employed.  Patient denies past history of military experience.  Patient denies a past history of prison or jail time.  Patient's highest education earned is his high school diploma.  Patient denies any weapons being in the home.  Allergies:   Allergies  Allergen Reactions   Bee Venom Anaphylaxis   Apple Juice Other (See Comments)    Reaction unknown   Corn-Containing Products Other (See Comments)    Reaction unknown   Milk (Cow) Itching and Other (See Comments)   Other Rash and Other (See Comments)    Green Beans: Asthma   Peanut Butter Flavor Cough    Metabolic Disorder Labs: Lab Results  Component Value Date   HGBA1C 5.8 (H) 08/13/2022   MPG 120 08/13/2022   MPG 114.02 11/14/2019   Lab Results  Component Value Date   PROLACTIN 10.6 12/16/2020   PROLACTIN 43.1 (H) 11/14/2019   Lab Results  Component Value Date   CHOL 122 08/13/2022   TRIG 44 08/13/2022   HDL 52 08/13/2022   CHOLHDL 2.3 08/13/2022   VLDL 9 08/13/2022   LDLCALC 61 08/13/2022  [place] 69 10/30/2021   Lab Results  Component Value Date   TSH 1.547 08/13/2022    Therapeutic Level Labs: No results found for: "LITHIUM" Lab Results  Component Value Date   CBMZ 8.1 11/14/2019   No results found for: "VALPROATE"  Current Medications: Current Outpatient Medications  Medication Sig Dispense Refill   mirtazapine (REMERON) 7.5 MG tablet Take 1 tablet (7.5 mg total) by mouth at bedtime. 30 tablet 0   OLANZapine (ZYPREXA) 5 MG tablet Take 1 tablet (5 mg total) by mouth at bedtime. 30 tablet 1   No current facility-administered medications for this visit.    Musculoskeletal: Strength & Muscle Tone: within normal limits Gait & Station: normal Patient leans: N/A  Psychiatric Specialty Exam: Review of Systems  Psychiatric/Behavioral:  Positive for sleep disturbance. Negative for decreased concentration, dysphoric mood,  hallucinations, self-injury and suicidal ideas. The patient is nervous/anxious. The patient is not hyperactive.     There were no vitals taken for this visit.There is no height or weight on file to calculate BMI.  General Appearance: Casual  Eye Contact:  Fair  Speech:  Clear and Coherent and Normal Rate  Volume:  Normal  Mood:   Mildly anxious  Affect:  Congruent  Thought Process:  Coherent and Descriptions of Associations: Intact  Orientation:  Full (Time, Place, and Person)  Thought Content:  WDL  Suicidal Thoughts:  No  Homicidal Thoughts:  No  Memory:  Immediate;   Fair Recent;   Fair Remote;   Fair  Judgement:  Fair  Insight:  Fair  Psychomotor Activity:  Normal  Concentration:  Concentration: Fair and Attention Span: Good  Recall:  [employer] of Knowledge:Fair  Language: Good  Akathisia:  No  Handed:  Right  AIMS (if indicated):  not done  Assets:  Communication Skills Desire for Improvement Housing  ADL's:  Intact  Cognition: WNL  Sleep:  Fair   Screenings: AIMS    Flowsheet Row Admission (Discharged) from 12/14/2018 in [place] [facility] Admission (Discharged) from 09/20/2017 in [place] [facility] Admission (Discharged) from 01/09/2017 in [place] [facility] Admission (Discharged) from 05/13/2016 in [place] [facility]  AIMS Total Score 0 0 0 0      AUDIT    Flowsheet Row Admission (Discharged) from OP Visit from 11/11/2019 in [place] [facility] Admission (Discharged) from 09/20/2017 in [place] [facility] Admission (Discharged) from 01/09/2017 in [place] [facility] Admission (Discharged) from 05/13/2016 in [place] [facility]  Alcohol Use Disorder Identification Test Final Score (AUDIT) 0 0 0 3      GAD-7    Flowsheet Row Office Visit from 09/04/2022 in  [facility]  Total GAD-7 Score 7      PHQ2-9    [employer] Office Visit from 09/04/2022 in [facility] ED from 08/13/2022 in [facility]  PHQ-2 Total Score 4 0  PHQ-9 Total Score 7 --      [place] Office Visit from 09/04/2022 in [facility] ED from 08/13/2022 in [facility] ED from 10/30/2021 in [place] No Risk No Risk No Risk       Assessment and Plan:   Lawrence [name] is a 27 year old, African-American male with a past psychiatric history significant for  schizoaffective disorder bipolar type who presents to [facility], accompanied by his grandmother Lawrence [name], [phone]), to establish psychiatric care and for medication management.  Patient presents today requesting to be placed on an LAI.  Patient reports that he has a past history of receiving an LAI.  Per chart review, patient was scheduled for an Invega injection on 4/28 back in 2021.  Patient is currently taking olanzapine 5 mg at bedtime and mirtazapine 7.5 mg at bedtime for the management of his schizoaffective disorder.  Patient admits to not taking his olanzapine for the past 4 days due to the medications making him feel lightheaded when taking them.  Provider attempted to start patient on risperidone to prepare him for a long-acting injectable; however, there corn products within the risperidone medication which patient is allergic to.  Patient agreed to continue taking olanzapine 5 mg at bedtime for the management of his schizoaffective disorder.  Since patient has a history of receiving the Invega injection, provider will reach out to patient to see if patient experiences any allergic reactions to the medication.  If patient did not experience any adverse side effects from  the medication, provider set up patient with LAI.  Collaboration of Care: Medication Management AEB provider managing patient's psychiatric medications and Psychiatrist AEB patient being seen by a mental health provider  Patient/Guardian was advised Release of Information must be obtained prior to any record release in order to collaborate their care with an outside provider. Patient/Guardian was advised if they have not already done so to contact the registration department to sign all necessary forms in order for [place] to release information regarding their care.   Consent: Patient/Guardian gives verbal consent for treatment and assignment of benefits for services provided during this visit. Patient/Guardian expressed understanding and agreed to proceed.   1. Schizoaffective disorder, bipolar type (HCC)  - OLANZapine (ZYPREXA) 5 MG tablet; Take 1 tablet (5 mg total) by mouth at bedtime.  Dispense: 30 tablet; Refill: 1  Patient to follow-up in 6 weeks Provider spent a total of 43 minutes with the patient/reviewing patient's chart  Meta Hatchet, PA 1/16/20246:06 PM

## 2022-09-05 ENCOUNTER — Telehealth (HOSPITAL_COMMUNITY): Payer: Self-pay | Admitting: Physician Assistant

## 2022-10-16 ENCOUNTER — Encounter (HOSPITAL_COMMUNITY): Payer: Self-pay | Admitting: Physician Assistant

## 2022-10-16 ENCOUNTER — Ambulatory Visit (INDEPENDENT_AMBULATORY_CARE_PROVIDER_SITE_OTHER): Payer: Self-pay | Admitting: Physician Assistant

## 2022-10-16 VITALS — BP 124/79 | HR 68 | Temp 98.0°F | Ht 73.0 in | Wt 176.8 lb

## 2022-10-16 DIAGNOSIS — F250 Schizoaffective disorder, bipolar type: Secondary | ICD-10-CM

## 2022-10-16 NOTE — Progress Notes (Unsigned)
[facility] MD/PA/NP OP Progress Note  10/16/2022 5:45 PM Lawrence [name]  MRN:  [id number]  Chief Complaint:  Chief Complaint  Patient presents with   Follow-up   HPI: ***  Lawrence [name]. [place]  Visit Diagnosis:    ICD-10-CM   1. Schizoaffective disorder, bipolar type ([place])  F25.0       Past Psychiatric History:  Affective disorder (bipolar type)     Past Medical History:  Past Medical History:  Diagnosis Date   ADD (attention deficit disorder)    Asthma    Depressed    Schizoaffective disorder ([place])    History reviewed. No pertinent surgical history.  Family Psychiatric History:  Patient denies family history of psychiatric illness; however, patient's grandmother states that the patient's mother saw a psychologist during her 12th grade year of high school.  Patient denies any family members being on medications at this time.   Patient denies a family history of suicide Patient denies a family history of homicide Patient denies a family history of substance abuse stating that he is the only one that uses illicit substances  Family History: History reviewed. No pertinent family history.  Social History:  Social History   Socioeconomic History   Marital status: Single    Spouse name: Not on file   Number of children: Not on file   Years of education: Not on file   Highest education level: Not on file  Occupational History   Occupation: Unemployed  Tobacco Use   Smoking status: Some Days    Years: 0.00    Types: Cigarettes   Smokeless tobacco: Never  Vaping Use   Vaping Use: Never used  Substance and Sexual Activity   Alcohol use: Not Currently   Drug use: Yes    Frequency: 2.0 times per week    Types: Marijuana   Sexual activity: Never  Other Topics Concern   Not on file  Social History Narrative   Pt is unemployed; lives with grandmother   Social Determinants of Health   Financial Resource Strain: Not on file  Food Insecurity: Not on file   Transportation Needs: Not on file  Physical Activity: Not on file  Stress: Not on file  Social Connections: Not on file    Allergies:  Allergies  Allergen Reactions   Bee Venom Anaphylaxis   Apple Juice Other (See Comments)    Reaction unknown   Corn-Containing Products Other (See Comments)    Reaction unknown   Milk (Cow) Itching and Other (See Comments)   Other Rash and Other (See Comments)    Green Beans: Asthma   Peanut Butter Flavor Cough    Metabolic Disorder Labs: Lab Results  Component Value Date   HGBA1C 5.8 (H) 08/13/2022   MPG 120 08/13/2022   MPG 114.02 11/14/2019   Lab Results  Component Value Date   PROLACTIN 10.6 12/16/2020   PROLACTIN 43.1 (H) 11/14/2019   Lab Results  Component Value Date   CHOL 122 08/13/2022   TRIG 44 08/13/2022   HDL 52 08/13/2022   CHOLHDL 2.3 08/13/2022   VLDL 9 08/13/2022   LDLCALC 61 08/13/2022   LDLCALC 69 10/30/2021   Lab Results  Component Value Date   TSH 1.547 08/13/2022   TSH 3.314 10/30/2021    Therapeutic Level Labs: No results found for: "LITHIUM" No results found for: "VALPROATE" Lab Results  Component Value Date   CBMZ 8.1 11/14/2019    Current Medications: Current Outpatient Medications  Medication Sig Dispense Refill  mirtazapine (REMERON) 7.5 MG tablet Take 1 tablet (7.5 mg total) by mouth at bedtime. 30 tablet 0   OLANZapine (ZYPREXA) 5 MG tablet Take 1 tablet (5 mg total) by mouth at bedtime. 30 tablet 1   No current facility-administered medications for this visit.     Musculoskeletal: Strength & Muscle Tone: within normal limits Gait & Station: normal Patient leans: N/A  Psychiatric Specialty Exam: Review of Systems  Psychiatric/Behavioral:  Positive for sleep disturbance. Negative for decreased concentration, dysphoric mood, hallucinations, self-injury and suicidal ideas. The patient is not nervous/anxious and is not hyperactive.     Blood pressure 124/79, pulse 68, temperature 98  F (36.7 C), temperature source Oral, height '6\' 1"'$  (1.854 m), weight 176 lb 12.8 oz (80.2 kg), SpO2 100 %.Body mass index is 23.33 kg/m.  General Appearance: Casual  Eye Contact:  Good  Speech:  Clear and Coherent and Normal Rate  Volume:  Normal  Mood:  Euthymic  Affect:  Appropriate  Thought Process:  Coherent and Descriptions of Associations: Intact  Orientation:  Full (Time, Place, and Person)  Thought Content: WDL   Suicidal Thoughts:  No  Homicidal Thoughts:  No  Memory:  Immediate;   Fair Recent;   Fair Remote;   Fair  Judgement:  Good  Insight:  Fair  Psychomotor Activity:  Normal  Concentration:  Concentration: Good and Attention Span: Good  Recall:  [employer] of Knowledge: Fair  Language: Good  Akathisia:  No  Handed:  Right  AIMS (if indicated): not done  Assets:  Communication Skills Desire for Improvement Housing  ADL's:  Intact  Cognition: WNL  Sleep:  Fair   Screenings: AIMS    Flowsheet Row Admission (Discharged) from 12/14/2018 in [place] [facility] Admission (Discharged) from 09/20/2017 in [place] [facility] Admission (Discharged) from 01/09/2017 in [place] [facility] Admission (Discharged) from 05/13/2016 in [place] [facility]  AIMS Total Score 0 0 0 0      AUDIT    Flowsheet Row Admission (Discharged) from OP Visit from 11/11/2019 in [place] [facility] Admission (Discharged) from 09/20/2017 in [place] [facility] Admission (Discharged) from 01/09/2017 in [place] [facility] Admission (Discharged) from 05/13/2016 in [place] [facility]  Alcohol Use Disorder Identification Test Final Score (AUDIT) 0 0 0 3      GAD-7    Flowsheet Row Clinical Support from 10/16/2022 in [facility] Office Visit from 09/04/2022 in [facility]  Total GAD-7 Score 7 7      PHQ2-9    Flowsheet Row Clinical Support from 10/16/2022 in [facility] Office Visit from 09/04/2022 in [facility] ED from 08/13/2022 in [facility]  PHQ-2 Total Score 2 4 0  PHQ-9 Total Score 8 7 --      Flowsheet Row Clinical Support from 10/16/2022 in [facility] Office Visit from 09/04/2022 in [facility] ED from 08/13/2022 in [facility] No Risk No Risk No Risk        Assessment and Plan: ***    Collaboration of Care: Collaboration of Care: Medication Management AEB Provider managing patient's psychiatric medication and Psychiatrist AEB patient being followed by a mental health provider following the conclusion of the encounter  Patient/Guardian was advised Release of Information must be obtained prior to any record release in order to collaborate their care with an outside provider. Patient/Guardian was advised if they have not already done so to contact the registration department to sign all necessary forms in order for [place] to release information regarding their care.   Consent: Patient/Guardian gives verbal consent for treatment and assignment of benefits for services provided during this visit. Patient/Guardian expressed understanding and agreed to proceed.   1. Schizoaffective disorder, bipolar type ([place]) Patient to continue taking olanzapine 5 mg at bedtime for the management of his schizoaffective disorder (bipolar type)   Patient to follow-up in 6 weeks Provider spent a total of 18 minutes with the patient/reviewing patient's chart  Malachy Mood, PA 10/16/2022, 5:45 PM

## 2022-11-28 ENCOUNTER — Encounter (HOSPITAL_COMMUNITY): Payer: Self-pay | Admitting: Physician Assistant

## 2022-11-28 ENCOUNTER — Ambulatory Visit (INDEPENDENT_AMBULATORY_CARE_PROVIDER_SITE_OTHER): Payer: No Payment, Other | Admitting: Physician Assistant

## 2022-11-28 DIAGNOSIS — F250 Schizoaffective disorder, bipolar type: Secondary | ICD-10-CM | POA: Diagnosis not present

## 2022-11-28 MED ORDER — RISPERIDONE 1 MG PO TABS: 1.0000 mg | ORAL_TABLET | Freq: Every day | ORAL | 0 refills | Status: DC

## 2022-11-28 MED ORDER — OLANZAPINE 2.5 MG PO TABS: 2.5000 mg | ORAL_TABLET | Freq: Every day | ORAL | 0 refills | Status: DC

## 2022-11-28 NOTE — Progress Notes (Signed)
BH MD/PA/NP OP Progress Note  11/28/2022 1:04 PM Chyrel MassonCameron D [name]  MRN:  [id number]  Chief Complaint:  Chief Complaint  Patient presents with   Follow-up   Medication Management   HPI:   Lawrence [name] is a 27 year old, African-American male with a past psychiatric history significant for schizoaffective disorder bipolar type who [facility], accompanied by his grandmother Lonzo Cloud(Patricia Clark, [phone]), for follow-up and medication management.  Patient is currently being managed on the following medication: Olanzapine 5 mg at bedtime.  Patient reports no issues or concerns regarding his use of olanzapine.  Patient denies experiencing any adverse side effects from his use of olanzapine.  Patient is still interested in being placed on the Uzedy long-acting injectable.  Patient does not have insurance; however, patient is able to be placed on patient assistance in order to receive his injection regularly.  Prior to patient being placed on Uzedy, provider discussed with patient about off olanzapine and being placed on Risperdal for a few weeks to determine if the medication is helpful in managing his symptoms.  Patient vocalized understanding.  A GAD-7 screen was performed with the patient scoring a 3.  Patient is alert and oriented x 4, calm, cooperative, and fully engaged in conversation during the encounter.  Patient endorses good mood.  Patient speech is clear, coherent, and with normal rate.  Patient maintained good eye contact.  Patient denies suicidal or homicidal ideations.  He further denies auditory or visual hallucinations and does not appear to be responding to internal/external stimuli.  Patient endorses fair sleep and receives on average 5 to 6 hours of sleep each night.  Patient endorses good appetite and eats on average 3 meals per day.  Patient denies alcohol consumption, tobacco use, and illicit drug use.  Visit Diagnosis:     ICD-10-CM   1. Schizoaffective disorder, bipolar type  F25.0 risperiDONE (RISPERDAL) 1 MG tablet    OLANZapine (ZYPREXA) 2.5 MG tablet      Past Psychiatric History:  Affective disorder (bipolar type)  Past Medical History:  Past Medical History:  Diagnosis Date   ADD (attention deficit disorder)    Asthma    Depressed    Schizoaffective disorder    History reviewed. No pertinent surgical history.  Family Psychiatric History:  Patient denies family history of psychiatric illness; however, patient's grandmother states that the patient's mother saw a psychologist during her 12th grade year of high school.  Patient denies any family members being on medications at this time.   Patient denies a family history of suicide Patient denies a family history of homicide Patient denies a family history of substance abuse stating that he is the only one that uses illicit substances  Family History: History reviewed. No pertinent family history.  Social History:  Social History   Socioeconomic History   Marital status: Single    Spouse name: Not on file   Number of children: Not on file   Years of education: Not on file   Highest education level: Not on file  Occupational History   Occupation: Unemployed  Tobacco Use   Smoking status: Some Days    Years: 0    Types: Cigarettes   Smokeless tobacco: Never  Vaping Use   Vaping Use: Never used  Substance and Sexual Activity   Alcohol use: Not Currently   Drug use: Yes    Frequency: 2.0 times per week    Types: Marijuana   Sexual activity: Never  Other Topics Concern   Not on file  Social History Narrative   Pt is unemployed; lives with grandmother   Social Determinants of Health   Financial Resource Strain: Not on file  Food Insecurity: Not on file  Transportation Needs: Not on file  Physical Activity: Not on file  Stress: Not on file  Social Connections: Not on file    Allergies:  Allergies  Allergen Reactions    Bee Venom Anaphylaxis   Apple Juice Other (See Comments)    Reaction unknown   Corn-Containing Products Other (See Comments)    Reaction unknown   Milk (Cow) Itching and Other (See Comments)   Other Rash and Other (See Comments)    Green Beans: Asthma   Peanut Butter Flavor Cough    Metabolic Disorder Labs: Lab Results  Component Value Date   HGBA1C 5.8 (H) 08/13/2022   MPG 120 08/13/2022   MPG 114.02 11/14/2019   Lab Results  Component Value Date   PROLACTIN 10.6 12/16/2020   PROLACTIN 43.1 (H) 11/14/2019   Lab Results  Component Value Date   CHOL 122 08/13/2022   TRIG 44 08/13/2022   HDL 52 08/13/2022   CHOLHDL 2.3 08/13/2022   VLDL 9 08/13/2022   LDLCALC 61 08/13/2022   LDLCALC 69 10/30/2021   Lab Results  Component Value Date   TSH 1.547 08/13/2022   TSH 3.314 10/30/2021    Therapeutic Level Labs: No results found for: "LITHIUM" No results found for: "VALPROATE" Lab Results  Component Value Date   CBMZ 8.1 11/14/2019    Current Medications: Current Outpatient Medications  Medication Sig Dispense Refill   risperiDONE (RISPERDAL) 1 MG tablet Take 1 tablet (1 mg total) by mouth daily. 14 tablet 0   mirtazapine (REMERON) 7.5 MG tablet Take 1 tablet (7.5 mg total) by mouth at bedtime. 30 tablet 0   OLANZapine (ZYPREXA) 2.5 MG tablet Take 1 tablet (2.5 mg total) by mouth at bedtime. 14 tablet 0   No current facility-administered medications for this visit.     Musculoskeletal: Strength & Muscle Tone: within normal limits Gait & Station: normal Patient leans: N/A  Psychiatric Specialty Exam: Review of Systems  Psychiatric/Behavioral:  Positive for sleep disturbance. Negative for decreased concentration, dysphoric mood, hallucinations, self-injury and suicidal ideas. The patient is not nervous/anxious and is not hyperactive.     Blood pressure 126/72, pulse 75, temperature 98.8 F (37.1 C), temperature source Oral, height 6\' 1"  (1.854 m), weight 183 lb  (83 kg), SpO2 100 %.Body mass index is 24.14 kg/m.  General Appearance: Casual  Eye Contact:  Good  Speech:  Clear and Coherent and Normal Rate  Volume:  Normal  Mood:  Euthymic  Affect:  Appropriate  Thought Process:  Coherent and Descriptions of Associations: Intact  Orientation:  Full (Time, Place, and Person)  Thought Content: WDL   Suicidal Thoughts:  No  Homicidal Thoughts:  No  Memory:  Immediate;   Fair Recent;   Fair Remote;   Fair  Judgement:  Good  Insight:  Fair  Psychomotor Activity:  Normal  Concentration:  Concentration: Good and Attention Span: Good  Recall:  [employer] of Knowledge: Fair  Language: Good  Akathisia:  No  Handed:  Right  AIMS (if indicated): not done  Assets:  Communication Skills Desire for Improvement Housing  ADL's:  Intact  Cognition: WNL  Sleep:  Fair   Screenings: AIMS    Flowsheet Row [facility] Ad[facility] (Dis[facility] Ad[facility] (Discharged) [facility] Ad[facility] (Dis[facility]) [facility]  AIMS Total Score 0 0 0 0      AUDIT    Flowsheet Row Admission (Discharged) from [facility] Ad[facility] (Discharged) [facility] Admission (Discharged) from [facility] Admission (Discharged) [facility]  Alcohol Use Disorder Identification Test Final Score (AUDIT) 0 0 0 3      GAD-7    Flowsheet Row Clinical Support from 11/28/2022 in [facility] Clinical Support from 10/16/2022 in [facility] Office Visit from 09/04/2022 in [facility]  Total GAD-7 Score 3 7 7       PHQ2-9    Flowsheet Row Clinical Support from 11/28/2022 in [facility] Clinical Support from 10/16/2022 in [facility] Office Visit from 09/04/2022 in [facility] ED from 08/13/2022 in [facility]  PHQ-2 Total Score 0 2 4 0  PHQ-9 Total Score -- 8 7 --      Flowsheet Row Clinical Support from 11/28/2022 in [facility] Clinical Support from 10/16/2022 in [facility] Office Visit from 09/04/2022 in [facility]  C-SSRS RISK CATEGORY No Risk No Risk No Risk        Assessment and Plan:   Mckinley Clinton. Schaufler is a 27 year old, African-American male with a past psychiatric history significant for schizoaffective disorder bipolar type who presents to [facility], accompanied by his grandmother Lonzo Cloud, [phone]), for follow-up and medication management.  Patient reports no issues or concerns regarding his use of his current medication.  Patient is still interested and being placed on the Uzedy long-acting injectable.  Provider discussed with patient that in order to be placed on Uzedy, then he would need to be tapered off olanzapine and started on Risperdal to see if Risperdal is effective in managing his symptoms.  Patient vocalized understanding.  Provider to place patient on olanzapine 2.5 mg for 2 weeks before discontinuing.  During his second week of taking olanzapine, patient to start Risperdal 1 mg daily for the management of his schizoaffective disorder.  Provider to reach out to patient to determine efficacy of Risperdal on managing his symptoms.  Patient's medications to be e-prescribed to pharmacy of choice.  Collaboration of Care: Collaboration of Care: Medication Management AEB Provider  managing patient's psychiatric medication and Psychiatrist AEB patient being followed by a mental health provider following the conclusion of the encounter  Patient/Guardian was advised Release of Information must be obtained prior to any record release in order to collaborate their care with an outside provider. Patient/Guardian was advised if they have not already done so to contact the registration department to sign all necessary forms in order for [place] to release information regarding their care.   Consent: Patient/Guardian gives verbal consent for treatment and assignment of benefits for services provided during this visit. Patient/Guardian expressed understanding and agreed to proceed.   1. Schizoaffective disorder, bipolar type  - risperiDONE (RISPERDAL) 1 MG tablet; Take 1 tablet (1 mg total) by mouth daily.  Dispense: 14 tablet; Refill: 0 -  OLANZapine (ZYPREXA) 2.5 MG tablet; Take 1 tablet (2.5 mg total) by mouth at bedtime.  Dispense: 14 tablet; Refill: 0  Provider to follow up with patient regarding next appointment  Provider spent a total of 18 minutes with the patient/reviewing patient's chart  Meta Hatchet, PA 11/28/2022, 1:04 PM

## 2022-12-19 ENCOUNTER — Telehealth (HOSPITAL_COMMUNITY): Payer: Self-pay | Admitting: Physician Assistant

## 2022-12-20 ENCOUNTER — Other Ambulatory Visit (HOSPITAL_COMMUNITY): Payer: Self-pay

## 2022-12-20 ENCOUNTER — Other Ambulatory Visit (HOSPITAL_COMMUNITY): Payer: Self-pay | Admitting: Psychiatry

## 2022-12-20 ENCOUNTER — Telehealth (HOSPITAL_COMMUNITY): Payer: Self-pay

## 2022-12-20 ENCOUNTER — Other Ambulatory Visit (HOSPITAL_COMMUNITY): Payer: Self-pay | Admitting: Physician Assistant

## 2022-12-20 DIAGNOSIS — F250 Schizoaffective disorder, bipolar type: Secondary | ICD-10-CM

## 2022-12-20 MED ORDER — UZEDY 100 MG/0.28ML ~~LOC~~ SUSY: 100.0000 mg | PREFILLED_SYRINGE | SUBCUTANEOUS | 11 refills | Status: DC

## 2022-12-20 MED ORDER — RISPERIDONE 2 MG PO TABS: 1.0000 mg | ORAL_TABLET | Freq: Every day | ORAL | 0 refills | Status: DC

## 2022-12-20 NOTE — Telephone Encounter (Signed)
Provider spoke to patient and grandmother and informed him that they can be seen on 12/24/2022.  Appointment set for 8:30 for medication management and 9:00 for shot clinic.

## 2022-12-24 ENCOUNTER — Ambulatory Visit (HOSPITAL_COMMUNITY): Payer: No Payment, Other

## 2022-12-25 ENCOUNTER — Ambulatory Visit (HOSPITAL_COMMUNITY): Payer: No Payment, Other

## 2022-12-25 ENCOUNTER — Encounter (HOSPITAL_COMMUNITY): Payer: Self-pay

## 2022-12-25 ENCOUNTER — Ambulatory Visit (INDEPENDENT_AMBULATORY_CARE_PROVIDER_SITE_OTHER): Payer: No Payment, Other | Admitting: Psychiatry

## 2022-12-25 VITALS — BP 106/66 | HR 65 | Temp 97.8°F | Wt 194.2 lb

## 2022-12-25 DIAGNOSIS — G4700 Insomnia, unspecified: Secondary | ICD-10-CM

## 2022-12-25 DIAGNOSIS — F411 Generalized anxiety disorder: Secondary | ICD-10-CM

## 2022-12-25 DIAGNOSIS — F250 Schizoaffective disorder, bipolar type: Secondary | ICD-10-CM

## 2022-12-25 DIAGNOSIS — F200 Paranoid schizophrenia: Secondary | ICD-10-CM

## 2022-12-25 MED ORDER — UZEDY 100 MG/0.28ML ~~LOC~~ SUSY: 100.0000 mg | PREFILLED_SYRINGE | SUBCUTANEOUS | 11 refills | Status: DC

## 2022-12-25 MED ORDER — RISPERIDONE ER 100 MG/0.28ML ~~LOC~~ SUSY
100.0000 mg | PREFILLED_SYRINGE | Freq: Once | SUBCUTANEOUS | Status: AC
  Administered 2022-12-25: 100 mg via SUBCUTANEOUS

## 2022-12-25 NOTE — Progress Notes (Signed)
BH MD/PA/NP OP Progress Note  12/25/2022 9:18 AM Lawrence [name]  MRN:  [id number]  Chief Complaint: "I am ready for Uzedy"  HPI: 27 year old male seen today for follow-up psychiatric evaluation.  He has a psychiatric history of depression, intermittent explosive disorder, schizoaffective disorder bipolar type, and schizophrenia.  Currently he is managed on Risperdal 2 mg nightly and mirtazapine 7.5 mg nightly.  He informed Clinical research associate that he has not been taking mirtazapine.  Today patient is well-groomed, pleasant, cooperative, and engaged in conversation.  He informed Clinical research associate that he is doing well.  He notes that he is ready for his Uzedy injection and notes that he did not have side effects from oral Risperdal.  Patient notes that his mood is stable and reports that he has minimal anxiety and depression.  Today provider conducted GAD-7 and patient scored an 8.  Provider also conducted a PHQ-9 and patient scored a 6.  He endorses adequate sleep noting that he sleeps 6 or more hours.  He also informed Clinical research associate that his appetite is adequate.  Today he denies SI/HI/AVH, mania, paranoia.  Patient was seen with his grandmother who notes that he is doing well.  She informed writer that at times if patient becomes irritable but reports that overall he is well.  She also informed writer that at times speaks in third person and seems to be responding to internal stimuli.  Patient however denies internal stimuli.  Today provider conducted an aims assessment and patient scored a 0.  Today oral Risperdal discontinued.  Patient received his first Uzedy 100 mg injection today.Potential side effects of medication and risks vs benefits of treatment vs non-treatment were explained and discussed. All questions were answered.  At this time mirtazapine not restarted.  No other concerns noted at this time. Visit Diagnosis:    ICD-10-CM   1. Schizoaffective disorder, bipolar type (HCC)  F25.0 risperiDONE ER (UZEDY) 100  MG/0.28ML SUSY      Past Psychiatric History: depression, intermittent explosive disorder, schizoaffective disorder bipolar type, and schizophrenia  Past Medical History:  Past Medical History:  Diagnosis Date   ADD (attention deficit disorder)    Asthma    Depressed    Schizoaffective disorder (HCC)    No past surgical history on file.  Family Psychiatric History: Patient denies family history of psychiatric illness; however, patient's grandmother states that the patient's mother saw a psychologist during her 12th grade year of high school.  Patient denies any family members being on medications at this time.   Family History: No family history on file.  Social History:  Social History   Socioeconomic History   Marital status: Single    Spouse name: Not on file   Number of children: Not on file   Years of education: Not on file   Highest education level: Not on file  Occupational History   Occupation: Unemployed  Tobacco Use   Smoking status: Some Days    Years: 0    Types: Cigarettes   Smokeless tobacco: Never  Vaping Use   Vaping Use: Never used  Substance and Sexual Activity   Alcohol use: Not Currently   Drug use: Yes    Frequency: 2.0 times per week    Types: Marijuana   Sexual activity: Never  Other Topics Concern   Not on file  Social History Narrative   Pt is unemployed; lives with grandmother   Social Determinants of Health   Financial Resource Strain: Not on file  Food Insecurity: Not  on file  Transportation Needs: Not on file  Physical Activity: Not on file  Stress: Not on file  Social Connections: Not on file    Allergies:  Allergies  Allergen Reactions   Bee Venom Anaphylaxis   Apple Juice Other (See Comments)    Reaction unknown   Corn-Containing Products Other (See Comments)    Reaction unknown   Milk (Cow) Itching and Other (See Comments)   Other Rash and Other (See Comments)    Green Beans: Asthma   Peanut Butter Flavor Cough     Metabolic Disorder Labs: Lab Results  Component Value Date   HGBA1C 5.8 (H) 08/13/2022   MPG 120 08/13/2022   MPG 114.02 11/14/2019   Lab Results  Component Value Date   PROLACTIN 10.6 12/16/2020   PROLACTIN 43.1 (H) 11/14/2019   Lab Results  Component Value Date   CHOL 122 08/13/2022   TRIG 44 08/13/2022   HDL 52 08/13/2022   CHOLHDL 2.3 08/13/2022   VLDL 9 08/13/2022   LDLCALC 61 08/13/2022   LDLCALC 69 10/30/2021   Lab Results  Component Value Date   TSH 1.547 08/13/2022   TSH 3.314 10/30/2021    Therapeutic Level Labs: No results found for: "LITHIUM" No results found for: "VALPROATE" Lab Results  Component Value Date   CBMZ 8.1 11/14/2019    Current Medications: Current Outpatient Medications  Medication Sig Dispense Refill   risperiDONE ER (UZEDY) 100 MG/0.28ML SUSY Inject 0.28 mLs (100 mg total) into the skin every 28 (twenty-eight) days. 100 mL 11   No current facility-administered medications for this visit.     Musculoskeletal: Strength & Muscle Tone: within normal limits Gait & Station: normal Patient leans: N/A  Psychiatric Specialty Exam: Review of Systems  Blood pressure 106/66, pulse 65, temperature 97.8 F (36.6 C), weight 194 lb 3.2 oz (88.1 kg), SpO2 98 %.Body mass index is 25.62 kg/m.  General Appearance: Well Groomed  Eye Contact:  Good  Speech:  Clear and Coherent and Normal Rate  Volume:  Normal  Mood:  Euthymic  Affect:  Appropriate and Congruent  Thought Process:  Coherent, Goal Directed, and Linear  Orientation:  Full (Time, Place, and Person)  Thought Content: WDL and Logical   Suicidal Thoughts:  Yes.  without intent/plan  Homicidal Thoughts:  No  Memory:  Immediate;   Good Recent;   Good Remote;   Good  Judgement:  Good  Insight:  Good  Psychomotor Activity:  Normal  Concentration:  Concentration: Good and Attention Span: Good  Recall:  Good  Fund of Knowledge: Good  Language: Good  Akathisia:  No  Handed:   Right  AIMS (if indicated): not done  Assets:  Communication Skills Desire for Improvement Financial Resources/Insurance Housing Leisure Time Physical Health Social Support Transportation  ADL's:  Intact  Cognition: WNL  Sleep:  Good   Screenings: AIMS    Flowsheet Row [facility] Admission (Discharged) [facility] Admission (Discharged) [facility] Admission (Discharged) [facility]  AIMS Total Score 0 0 0 0      AUDIT    Flowsheet Row Admission (Discharged) from [facility] Admission (Discharged) from [facility] Admission (Discharged) from [facility] Admission (Discharged) from  [facility]  Alcohol Use Disorder Identification Test Final Score (AUDIT) 0 0 0 3      GAD-7    Flowsheet Row Office Visit from 12/25/2022 in [facility] Clinical Support from 11/28/2022 in [facility] Clinical Support from 10/16/2022 in [facility] Office Visit from 09/04/2022 in [facility]  Total GAD-7 Score 8 3 7 7       PHQ2-9    Flowsheet Row Office Visit from 12/25/2022 in [facility] Clinical Support from 11/28/2022 in [facility] Clinical Support from 10/16/2022 in [facility] Office Visit from 09/04/2022 in [facility] ED from 08/13/2022 in [facility]  PHQ-2 Total Score 3 0 2 4 0  PHQ-9 Total Score 6 -- 8 7 --       Flowsheet Row Office Visit from 12/25/2022 in [facility] Clinical Support from 11/28/2022 in [facility] Clinical Support from 10/16/2022 in [facility]  C-SSRS RISK CATEGORY Error: Q7 should not be populated when Q6 is No No Risk No Risk        Assessment and Plan: Patient notes that he is doing well on his oral Risperdal. He notes that he is ready for his Uzedy injection. Today he is agreeable to starting Uzedy 100 mg monthly. He will discontinue oral Risperdal at this time. He notes that he has not been taking mirtazapine.    1. Schizoaffective disorder, bipolar type (HCC)  Start- risperiDONE ER (UZEDY) 100 MG/0.28ML SUSY; Inject 0.28 mLs (100 mg total) into the skin every 28 (twenty-eight) days.  Dispense: 100 mL; Refill: 11     Collaboration of Care: Collaboration of Care: Other provider involved in patient's care AEB Shot clinic staff and PCP  Patient/Guardian was advised Release of Information must be obtained prior to any record release in order to collaborate their care with an outside provider. Patient/Guardian was advised if they have not already done so to contact the registration department to sign all necessary forms in order for [place] to release information regarding their care.   Consent: Patient/Guardian gives verbal consent for treatment and assignment of benefits for services provided during this visit. Patient/Guardian expressed understanding and agreed to proceed.   Follow up in 2.5 months Follow up with shot clinic in a month Shanna Cisco, NP 12/25/2022, 9:18 AM

## 2022-12-25 NOTE — Telephone Encounter (Signed)
Message acknowledged and reviewed.

## 2022-12-25 NOTE — Progress Notes (Signed)
PATIENT PRESENTS TO THE OFFICE FOR UZEDY 100 MG , WAS GIVEN BY Jaslen Adcox IN THE RIGHT DELTOID PT TOLERATED WELL WILL RETURN IN 28 DAYS

## 2023-01-24 ENCOUNTER — Encounter (HOSPITAL_COMMUNITY): Payer: Self-pay

## 2023-01-24 ENCOUNTER — Ambulatory Visit (INDEPENDENT_AMBULATORY_CARE_PROVIDER_SITE_OTHER): Payer: Medicaid Other

## 2023-01-24 VITALS — BP 104/72 | HR 81 | Resp 16 | Ht 73.0 in | Wt 191.8 lb

## 2023-01-24 DIAGNOSIS — F250 Schizoaffective disorder, bipolar type: Secondary | ICD-10-CM | POA: Diagnosis not present

## 2023-01-24 MED ORDER — RISPERIDONE ER 100 MG/0.28ML ~~LOC~~ SUSY
100.0000 mg | PREFILLED_SYRINGE | Freq: Once | SUBCUTANEOUS | Status: AC
  Administered 2023-01-24: 100 mg via SUBCUTANEOUS

## 2023-01-24 NOTE — Progress Notes (Cosign Needed)
PATIENT PRESENTS TO THE OFFICE FOR UZEDY 100 MG , WAS GIVEN BY Malyiah Fellows IN THE LEFT DELTOID PT TOLERATED WELL WILL RETURN IN 28 DAYS

## 2023-02-28 ENCOUNTER — Ambulatory Visit (INDEPENDENT_AMBULATORY_CARE_PROVIDER_SITE_OTHER): Payer: MEDICAID | Admitting: Psychiatry

## 2023-02-28 ENCOUNTER — Ambulatory Visit (INDEPENDENT_AMBULATORY_CARE_PROVIDER_SITE_OTHER): Payer: MEDICAID

## 2023-02-28 ENCOUNTER — Encounter (HOSPITAL_COMMUNITY): Payer: Self-pay | Admitting: Psychiatry

## 2023-02-28 ENCOUNTER — Encounter (HOSPITAL_COMMUNITY): Payer: Self-pay

## 2023-02-28 VITALS — BP 121/69 | HR 80 | Temp 98.0°F | Ht 73.0 in | Wt 194.0 lb

## 2023-02-28 VITALS — BP 121/69 | Temp 98.0°F | Ht 73.0 in | Wt 194.0 lb

## 2023-02-28 DIAGNOSIS — F250 Schizoaffective disorder, bipolar type: Secondary | ICD-10-CM

## 2023-02-28 DIAGNOSIS — Z0000 Encounter for general adult medical examination without abnormal findings: Secondary | ICD-10-CM | POA: Insufficient documentation

## 2023-02-28 MED ORDER — UZEDY 100 MG/0.28ML ~~LOC~~ SUSY: 100.0000 mg | PREFILLED_SYRINGE | SUBCUTANEOUS | 11 refills | Status: DC

## 2023-02-28 MED ORDER — RISPERIDONE ER 100 MG/0.28ML ~~LOC~~ SUSY
100.0000 mg | PREFILLED_SYRINGE | SUBCUTANEOUS | Status: DC
  Administered 2023-02-28 – 2023-09-12 (×6): 100 mg via SUBCUTANEOUS

## 2023-02-28 NOTE — Progress Notes (Cosign Needed Addendum)
Patient in today with his Grandmother and presented with appropriate affect, level and pleasant mood and denied any auditory or visual hallucinations, no suicidal or homicidal ideations, plan, means or intent to want to harm self or others.  Patient seen by Dr. Toy Cookey, NP prior to due injection this date to verify still ordered.  Patient's due Risperidone ER (Uzedy) 100 mg/0.28 ml SQ injection every 28 days prepared as ordered and continued by Dr. Doyne Keel today in his right arm.  Patient tolerated the due injection without compliant of pain or discomfort and agreed to return in 28 days for his next SQ Uzedy injection.  Patient to call if any issues prior to next appointment.

## 2023-02-28 NOTE — Progress Notes (Signed)
BH MD/PA/NP OP Progress Note  02/28/2023 9:53 AM Lawrence [name]  MRN:  [id number]  Chief Complaint: "I get dizzy after my injection" Per Grandmother "He is doing well"  HPI: 27 year old male seen today for follow-up psychiatric evaluation.  He has a psychiatric history of depression, intermittent explosive disorder, schizoaffective disorder bipolar type, and schizophrenia.  Currently he is managed on Uzedy 100 mg monthly.  He informed Clinical research associate that his medication is effective in managing his psychiatric conditions.   Today patient is well-groomed, pleasant, cooperative, and engaged in conversation.  He informed Clinical research associate that he becomes dizzy after his injection. He notes that it eventually wears off. Provider encouraged patient to drink water and eat prior to his injection. He endorsed understanding and agreed.  Since his last visit he notes that his mood continues to be stable and reports that he has minimal anxiety and depression.  Today provider conducted a GAD-7 and patient scored a 9, at his last visit he scored an 8. Provider also conducted a PHQ-9 and patient scored a 7, at her last visit 6.  He endorses good appetite.  Today he denies SI/HI/VAH, mania, paranoia.    Patient informed Clinical research associate that he notes that he feels that his mouth is infected.  Patient has several missing teeth.  Patient's grandmother asked about referral to a dentist.  Provider instructed patient to call [name].  He endorsed understanding and agreed.  Patient was seen with his grandmother who notes that he is doing well.  She informed Clinical research associate that at one time he was antisocial but notes that he is now socializing.  She also informed Clinical research associate that he has been more active and going shopping for his groceries.  She reports his sleep has drastically improved reporting that in the past he would go without sleep for days but now sleeps adequately.    No medication changes made today.  Patient agreeable to continue medication.  Patient has not had labs in on over 6 months. Today provider ordered CBC, CMP, Lipid panel, Hgb A1c, prolactin level, LFT, and Thyroid panel.  Patient referred to community health and wellness for primary care.  No other concerns noted at this time.  Visit Diagnosis:    ICD-10-CM   1. Well adult exam  Z00.00 Ambulatory referral to Internal Medicine    2. Schizoaffective disorder, bipolar type (HCC)  F25.0 risperiDONE ER (UZEDY) 100 MG/0.28ML SUSY    CBC w/Diff/Platelet    Comprehensive Metabolic Panel (CMET)    Lipid Profile    HgB A1c    Prolactin    Hepatic function panel    Thyroid Panel With TSH    Urine Drug Panel 7       Past Psychiatric History: depression, intermittent explosive disorder, schizoaffective disorder bipolar type, and schizophrenia  Past Medical History:  Past Medical History:  Diagnosis Date   ADD (attention deficit disorder)    Asthma    Depressed    Schizoaffective disorder (HCC)    No past surgical history on file.  Family Psychiatric History: Patient denies family history of psychiatric illness; however, patient's grandmother states that the patient's mother saw a psychologist during her 12th grade year of high school.  Patient denies any family members being on medications at this time.   Family History: No family history on file.  Social History:  Social History   Socioeconomic History   Marital status: Single    Spouse name: Not on file   Number of children: Not on file  Years of education: Not on file   Highest education level: Not on file  Occupational History   Occupation: Unemployed  Tobacco Use   Smoking status: Some Days    Types: Cigarettes   Smokeless tobacco: Never  Vaping Use   Vaping status: Never Used  Substance and Sexual Activity   Alcohol use: Not Currently   Drug use: Yes    Frequency: 2.0 times per week    Types: Marijuana   Sexual activity: Never  Other Topics Concern   Not on file  Social History Narrative    Pt is unemployed; lives with grandmother   Social Determinants of Health   Financial Resource Strain: Not on File (12/07/2021)   Received from [employer]    Financial Resource Strain: 0  Food Insecurity: Not on File (12/07/2021)   Received from [facility] Insecurity    Food: 0  Transportation Needs: Not on File (12/07/2021)   Received from [employer] Needs    Transportation: 0  Physical Activity: Not on File (12/07/2021)   Received from [facility]   Physical Activity    Physical Activity: 0  Stress: Not on File (12/07/2021)   Received from [facility]   Stress    Stress: 0  Social Connections: Not on File (12/07/2021)   Received from [facility]   Social Connections    Social Connections and Isolation: 0    Allergies:  Allergies  Allergen Reactions   Bee Venom Anaphylaxis   Apple Juice Other (See Comments)    Reaction unknown   Corn-Containing Products Other (See Comments)    Reaction unknown   Milk (Cow) Itching and Other (See Comments)   Other Rash and Other (See Comments)    Green Beans: Asthma   Peanut Butter Flavor Cough    Metabolic Disorder Labs: Lab Results  Component Value Date   HGBA1C 5.8 (H) 08/13/2022   MPG 120 08/13/2022   MPG 114.02 11/14/2019   Lab Results  Component Value Date   PROLACTIN 10.6 12/16/2020   PROLACTIN 43.1 (H) 11/14/2019   Lab Results  Component Value Date   CHOL 122 08/13/2022   TRIG 44 08/13/2022   HDL 52 08/13/2022   CHOLHDL 2.3 08/13/2022   VLDL 9 08/13/2022   LDLCALC 61 08/13/2022   LDLCALC 69 10/30/2021   Lab Results  Component Value Date   TSH 1.547 08/13/2022   TSH 3.314 10/30/2021    Therapeutic Level Labs: No results found for: "LITHIUM" No results found for: "VALPROATE" Lab Results  Component Value Date   CBMZ 8.1 11/14/2019    Current Medications: Current Outpatient Medications  Medication Sig Dispense Refill   risperiDONE ER (UZEDY) 100 MG/0.28ML SUSY Inject 0.28 mLs (100 mg  total) into the skin every 28 (twenty-eight) days. 100 mL 11   Current Facility-Administered Medications  Medication Dose Route Frequency Provider Last Rate Last Admin   risperiDONE ER SUSY 100 mg  100 mg Subcutaneous Q28 days Toy Cookey E, NP   100 mg at 02/28/23 1308     Musculoskeletal: Strength & Muscle Tone: within normal limits Gait & Station: normal Patient leans: N/A  Psychiatric Specialty Exam: Review of Systems  Blood pressure 121/69, pulse 80, temperature 98 F (36.7 C), height 6\' 1"  (1.854 m), weight 194 lb (88 kg), SpO2 100%.Body mass index is 25.6 kg/m.  General Appearance: Well Groomed  Eye Contact:  Good  Speech:  Clear and Coherent and Normal Rate  Volume:  Normal  Mood:  Euthymic  Affect:  Appropriate and Congruent  Thought Process:  Coherent, Goal Directed, and Linear  Orientation:  Full (Time, Place, and Person)  Thought Content: WDL and Logical   Suicidal Thoughts:  Yes.  without intent/plan  Homicidal Thoughts:  No  Memory:  Immediate;   Good Recent;   Good Remote;   Good  Judgement:  Good  Insight:  Good  Psychomotor Activity:  Normal  Concentration:  Concentration: Good and Attention Span: Good  Recall:  Good  Fund of Knowledge: Good  Language: Good  Akathisia:  No  Handed:  Right  AIMS (if indicated): done, 0  Assets:  Communication Skills Desire for Improvement Financial Resources/Insurance Housing Leisure Time Physical Health Social Support Transportation  ADL's:  Intact  Cognition: WNL  Sleep:  Good   Screenings: AIMS    Flowsheet Row Office Visit from 12/25/2022 in [facility]) [facility]) [facility] ([facility]) [facility]) [facility]  AIMS Total Score 0 0 0 0 0      AUDIT    Flowsheet Row Admission (Discharged) from [facility] (Dis[facility]) [facility] (Discharged) [facility] Ad[facility] (Discharged) [facility]  Alcohol Use Disorder Identification Test Final Score (AUDIT) 0 0 0 3      GAD-7    Flowsheet Row Clinical Support from 02/28/2023 in [facility] Office Visit from 12/25/2022 in [facility] Clinical Support from 11/28/2022 in [facility] Clinical Support from 10/16/2022 in [facility] Office Visit from 09/04/2022 in [facility]  Total GAD-7 Score 9 8 3 7 7       PHQ2-9    Flowsheet Row Clinical Support from 02/28/2023 in [facility] Office Visit from 12/25/2022 in [facility] Clinical Support from 11/28/2022 in [facility] Clinical Support from 10/16/2022 in [facility] Office Visit from 09/04/2022 in [facility]  PHQ-2 Total Score 3 3 0 2 4  PHQ-9 Total Score 7 6 -- 8 7      Flowsheet Row Clinical Support from 02/28/2023 in [facility] Office Visit from 12/25/2022 in [facility] Clinical Support from 11/28/2022 in [facility]  C-SSRS RISK CATEGORY Error: Q3, 4, or 5 should not be populated when Q2 is No Error: Q7 should not be populated when Q6 is No No Risk        Assessment and Plan: Patient notes that he is doing well on his oral Risperdal.  He does complain of dental pain.  Provider encouraged  patient to call [name].  He also reports that she needs a primary care doctor.  Patient referred to community health and wellness for primary care.  He will continue Uzedy as prescribed.Patient has not had labs in on over 6 months. Today provider ordered CBC, CMP, Lipid panel, Hgb A1c, prolactin level, LFT, and Thyroid panel.   1. Schizoaffective disorder, bipolar type (HCC)  Continue- risperiDONE ER (UZEDY)  100 MG/0.28ML SUSY; Inject 0.28 mLs (100 mg total) into the skin every 28 (twenty-eight) days.  Dispense: 100 mL; Refill: 11 - CBC w/Diff/Platelet - Comprehensive Metabolic Panel (CMET) - Lipid Profile - HgB A1c - Prolactin - Hepatic function panel - Thyroid Panel With TSH - Urine Drug Panel 7  2. Well adult exam  - Ambulatory referral to Internal Medicine      Collaboration of Care: Collaboration of Care: Other provider involved in patient's care [facility] staff and PCP  Patient/Guardian was advised Release of Information must be obtained prior to any record release in order to collaborate their care with an outside provider. Patient/Guardian was advised if they have not already done so to contact the registration department to sign all necessary forms in order for [place] to release information regarding their care.   Consent: Patient/Guardian gives verbal consent for treatment and assignment of benefits for services provided during this visit. Patient/Guardian expressed understanding and agreed to proceed.   Follow up in 2.5 months Follow up with shot clinic in a month Shanna Cisco, NP 02/28/2023, 9:53 AM

## 2023-03-19 ENCOUNTER — Other Ambulatory Visit (HOSPITAL_COMMUNITY): Payer: Self-pay

## 2023-03-19 MED ORDER — IBUPROFEN 800 MG PO TABS
ORAL_TABLET | ORAL | 1 refills | Status: DC
  Filled 2023-03-19 – 2023-03-23 (×2): qty 20, 5d supply, fill #0

## 2023-03-19 MED ORDER — CHLORHEXIDINE GLUCONATE 0.12 % MT SOLN
OROMUCOSAL | 1 refills | Status: DC
  Filled 2023-03-19: qty 473, 17d supply, fill #0
  Filled 2023-03-23: qty 473, 30d supply, fill #0

## 2023-03-19 MED ORDER — METHYLPREDNISOLONE 4 MG PO TBPK
ORAL_TABLET | ORAL | 0 refills | Status: DC
  Filled 2023-03-19 – 2023-03-23 (×2): qty 21, 6d supply, fill #0

## 2023-03-19 MED ORDER — AMOXICILLIN 500 MG PO CAPS
ORAL_CAPSULE | ORAL | 0 refills | Status: DC
  Filled 2023-03-19 – 2023-03-23 (×2): qty 30, 10d supply, fill #0

## 2023-03-23 ENCOUNTER — Other Ambulatory Visit (HOSPITAL_BASED_OUTPATIENT_CLINIC_OR_DEPARTMENT_OTHER): Payer: Self-pay

## 2023-03-23 ENCOUNTER — Other Ambulatory Visit (HOSPITAL_COMMUNITY): Payer: Self-pay

## 2023-03-25 ENCOUNTER — Other Ambulatory Visit (HOSPITAL_COMMUNITY): Payer: Self-pay

## 2023-03-25 ENCOUNTER — Other Ambulatory Visit (HOSPITAL_COMMUNITY): Payer: MEDICAID

## 2023-03-26 ENCOUNTER — Ambulatory Visit (INDEPENDENT_AMBULATORY_CARE_PROVIDER_SITE_OTHER): Payer: MEDICAID

## 2023-03-26 ENCOUNTER — Other Ambulatory Visit: Payer: Self-pay | Admitting: Psychiatry

## 2023-03-26 ENCOUNTER — Encounter (HOSPITAL_COMMUNITY): Payer: Self-pay

## 2023-03-26 VITALS — BP 112/72 | HR 72 | Ht 73.0 in | Wt 205.0 lb

## 2023-03-26 DIAGNOSIS — F200 Paranoid schizophrenia: Secondary | ICD-10-CM

## 2023-03-26 DIAGNOSIS — G4700 Insomnia, unspecified: Secondary | ICD-10-CM

## 2023-03-26 DIAGNOSIS — F411 Generalized anxiety disorder: Secondary | ICD-10-CM

## 2023-03-26 NOTE — Progress Notes (Cosign Needed)
PATIENT PRESENTS TO THE OFFICE FOR UZEDY 100 MG , WAS GIVEN BY Dianely Krehbiel IN THE LEFT DELTOID PT TOLERATED WELL WILL RETURN IN 28 DAYS

## 2023-03-28 ENCOUNTER — Ambulatory Visit (HOSPITAL_COMMUNITY): Payer: No Payment, Other

## 2023-03-28 NOTE — Progress Notes (Signed)
Provider discussed patient lab results.  Provider informed patient that currently he falls in the range of prediabetes and encouraged him to see his PCP.  He informed her that he is uncertain if he has a PCP but notes that he would call writer back for referral if he does not have one.  Provider also informed patient that his prolactin level is elevated and likely due to him being on Risperdal Camelia Eng).  Provide inform patient that in future he can be referred to endocrinology if prolactin levels remain elevated.  He endorsed understanding.  No other concerns noted at this time.

## 2023-03-29 ENCOUNTER — Other Ambulatory Visit: Payer: Self-pay

## 2023-03-29 ENCOUNTER — Other Ambulatory Visit (HOSPITAL_COMMUNITY): Payer: Self-pay

## 2023-03-29 MED ORDER — HYDROCODONE-ACETAMINOPHEN 7.5-325 MG/15ML PO SOLN
7.5000 mg | ORAL | 0 refills | Status: DC
  Filled 2023-03-29: qty 450, 5d supply, fill #0

## 2023-03-29 MED ORDER — HYDROCODONE-ACETAMINOPHEN 7.5-325 MG/15ML PO SOLN
15.0000 mL | ORAL | 0 refills | Status: DC | PRN
  Filled 2023-03-29: qty 450, 5d supply, fill #0

## 2023-04-12 ENCOUNTER — Ambulatory Visit (INDEPENDENT_AMBULATORY_CARE_PROVIDER_SITE_OTHER): Payer: MEDICAID | Admitting: Family Medicine

## 2023-04-12 ENCOUNTER — Encounter: Payer: Self-pay | Admitting: Family Medicine

## 2023-04-12 VITALS — BP 120/80 | HR 94 | Temp 98.0°F | Ht 73.0 in | Wt 210.0 lb

## 2023-04-12 DIAGNOSIS — R7303 Prediabetes: Secondary | ICD-10-CM | POA: Diagnosis not present

## 2023-04-12 NOTE — Progress Notes (Signed)
New Patient Office Visit  Subjective    Patient ID: Lawrence [name], male    DOB: 09-13-1995  Age: 27 y.o. MRN: [id number]  CC:  Chief Complaint  Patient presents with   New Patient (Initial Visit)    Prediabetic Last labs done on 03/26/23    HPI Lawrence [name] presents to establish care Patient is here with his mother today. Has been seeing [facility] for management of his schizoaffective. Was placed on Uzedy (risperidone) and he has been gaining weight for the last 4 months. He is seeing [employer] [facility] medicine for treatment. Mom reports that the Camelia Eng is doing a good job controlling his symptoms, however he has gained about 27 pounds since starting the medication, Labs also showed elevation in the A1C. I reviewed his labs from 8/6 with the patient, also reviewed his last progress note from [facility].   Pt states he likes sweets. He reports that a pack of soda lasts him about 2 days. He likes to eat sweets also per his mother's report.   I have reviewed all aspects of the patient's medical history including social, family, and surgical history. Has had all teeth removed-- has implants currently. Current Outpatient Medications  Medication Instructions   Uzedy 100 mg, Subcutaneous, Every 28 days    Past Medical History:  Diagnosis Date   ADD (attention deficit disorder)    Asthma    Depressed    Schizoaffective disorder (HCC)     History reviewed. No pertinent surgical history.  History reviewed. No pertinent family history.  Social History   Socioeconomic History   Marital status: Single    Spouse name: Not on file   Number of children: Not on file   Years of education: Not on file   Highest education level: Not on file  Occupational History   Occupation: Unemployed  Tobacco Use   Smoking status: Some Days    Types: Cigarettes   Smokeless tobacco: Never  Vaping Use   Vaping status: Never Used  Substance and Sexual Activity   Alcohol use: Not  Currently   Drug use: Yes    Frequency: 2.0 times per week    Types: Marijuana   Sexual activity: Never  Other Topics Concern   Not on file  Social History Narrative   Pt is unemployed; lives with grandmother   Social Determinants of Health   Financial Resource Strain: Not on File (12/07/2021)   Received from [employer], [employer]    Financial Resource Strain: 0  Food Insecurity: Not on File (12/07/2021)   Received from [place], [facility] Insecurity    Food: 0  Transportation Needs: Not on File (12/07/2021)   Received from [employer], [employer] Needs    Transportation: 0  Physical Activity: Not on File (12/07/2021)   Received from [place], [place]   Physical Activity    Physical Activity: 0  Stress: Not on File (12/07/2021)   Received from [facility], [place]   Stress    Stress: 0  Social Connections: Not on File (12/07/2021)   Received from [place], [place]   Social Connections    Social Connections and Isolation: 0  Intimate Partner Violence: Unknown (11/22/2021)   Received from [facility]   HITS    Physically Hurt: Not on file    Insult or Talk Down To: Not on file    Threaten Physical Harm: Not on file    Scream or Curse: Not on  file    Review of Systems  All other systems reviewed and are negative.       Objective    BP 120/80 (BP Location: Left Arm, Patient Position: Sitting, Cuff Size: Normal)   Pulse 94   Temp 98 F (36.7 C) (Oral)   Ht 6\' 1"  (1.854 m)   Wt 210 lb (95.3 kg)   SpO2 97%   BMI 27.71 kg/m   Physical Exam Vitals reviewed.  Constitutional:      Appearance: Normal appearance. He is well-groomed and normal weight.  Eyes:     Extraocular Movements: Extraocular movements intact.     Conjunctiva/sclera: Conjunctivae normal.  Neck:     Thyroid: No thyromegaly.  Cardiovascular:     Rate and Rhythm: Normal rate and regular rhythm.     Heart sounds: S1 normal and S2 normal. No murmur heard. Pulmonary:     Effort: Pulmonary  effort is normal.     Breath sounds: Normal breath sounds and air entry. No rales.  Abdominal:     General: Abdomen is flat. Bowel sounds are normal.  Musculoskeletal:     Right lower leg: No edema.     Left lower leg: No edema.  Neurological:     General: No focal deficit present.     Mental Status: He is alert and oriented to person, place, and time.     Gait: Gait is intact.  Psychiatric:        Mood and Affect: Mood and affect normal.     Last metabolic panel Lab Results  Component Value Date   GLUCOSE 111 (H) 03/26/2023   NA 140 03/26/2023   K 4.1 03/26/2023   CL 102 03/26/2023   CO2 19 (L) 03/26/2023   BUN 9 03/26/2023   CREATININE 1.11 03/26/2023   EGFR 94 03/26/2023   CALCIUM 9.6 03/26/2023   PROT 7.0 03/26/2023   ALBUMIN 4.5 03/26/2023   LABGLOB 2.5 03/26/2023   BILITOT 0.3 03/26/2023   ALKPHOS 89 03/26/2023   AST 38 03/26/2023   ALT 34 03/26/2023   ANIONGAP 13 08/13/2022   Last hemoglobin A1c Lab Results  Component Value Date   HGBA1C 5.9 (H) 03/26/2023        Assessment & Plan:  Prediabetes Assessment & Plan: I reviewed his labs from 8/6, his A1C was 5.9. I spent 30 minutes with the patient and mom today counselling on reducing sugar and starches in the diet to reduce blood sugars. We also discussed starting medication, however I think giving him a trial of changing his diet is appropriate. He drinks a significant amount of regular soda. I advised him to reduce the sugary drinks and switch to "zero" drinks. I will see him back in 3 months for a repeat A1C check.      Return in about 3 months (around 07/13/2023) for recheck A1C.   Karie Georges, MD

## 2023-04-12 NOTE — Assessment & Plan Note (Signed)
I reviewed his labs from 8/6, his A1C was 5.9. I spent 30 minutes with the patient and mom today counselling on reducing sugar and starches in the diet to reduce blood sugars. We also discussed starting medication, however I think giving him a trial of changing his diet is appropriate. He drinks a significant amount of regular soda. I advised him to reduce the sugary drinks and switch to "zero" drinks. I will see him back in 3 months for a repeat A1C check.

## 2023-04-25 ENCOUNTER — Encounter (HOSPITAL_COMMUNITY): Payer: Self-pay

## 2023-04-25 ENCOUNTER — Ambulatory Visit (INDEPENDENT_AMBULATORY_CARE_PROVIDER_SITE_OTHER): Payer: MEDICAID

## 2023-04-25 VITALS — BP 131/78 | HR 88 | Temp 98.3°F | Ht 73.5 in | Wt 218.0 lb

## 2023-04-25 DIAGNOSIS — F250 Schizoaffective disorder, bipolar type: Secondary | ICD-10-CM

## 2023-04-25 NOTE — Progress Notes (Cosign Needed Addendum)
Patient in today with his Grandmother and presented with appropriate affect, level and pleasant mood and denied any auditory or visual hallucinations, no suicidal or homicidal ideations, plan, intent, or means to want to harm self or others.  Patient and grandmother with some concern for patient's continued weight loss, from 194 lbs on 02/28/23 to 218 lb today.  Discussed with patient his diet and requested he work on trying to do some daily exercise as well.  Also pt's Grandmother reported pt's last HgbA1C was "creeping up" and agreed to make sure Dr. Doyne Keel was aware of the weight and glucose level concerns as this could be due to current medication regimen as well.  Patient and collateral acknowledged patient is doing better on the current Uzedy 100 mg/0.28 ml every 28 days and no issues at home.  Patient's due Uzedy 100 mg/0.28 ml subcutaneous injection prepared as ordered and given to patient on the backside of his right arm.  Patient denied any pain or discomfort from the injection and will return on 05/23/23 for next injection and to see Dr. Doyne Keel prior to discuss concerns.  Collateral stated she is going to change his primary pharmacy to the [facility] and will have them call [facility] to transfer his remaining Uzedy orders.   Patient left with no other concerns noted today and met with Dr. Doyne Keel after to inform of weight gain and pt's Grandmother concerns for his increasing glucose levels.  Dr. Doyne Keel to address at meeting with patient on 05/23/23 if weight continues to increase and HgbA1C continue to go up.

## 2023-05-16 ENCOUNTER — Ambulatory Visit: Payer: Self-pay | Admitting: Family Medicine

## 2023-05-23 ENCOUNTER — Encounter (HOSPITAL_COMMUNITY): Payer: Self-pay

## 2023-05-23 ENCOUNTER — Other Ambulatory Visit (HOSPITAL_COMMUNITY): Payer: Self-pay | Admitting: Psychiatry

## 2023-05-23 ENCOUNTER — Other Ambulatory Visit (HOSPITAL_BASED_OUTPATIENT_CLINIC_OR_DEPARTMENT_OTHER): Payer: Self-pay

## 2023-05-23 ENCOUNTER — Ambulatory Visit (INDEPENDENT_AMBULATORY_CARE_PROVIDER_SITE_OTHER): Payer: MEDICAID

## 2023-05-23 ENCOUNTER — Ambulatory Visit (INDEPENDENT_AMBULATORY_CARE_PROVIDER_SITE_OTHER): Payer: MEDICAID | Admitting: Psychiatry

## 2023-05-23 VITALS — BP 122/75 | HR 77 | Temp 98.2°F | Ht 73.0 in | Wt 231.4 lb

## 2023-05-23 DIAGNOSIS — F411 Generalized anxiety disorder: Secondary | ICD-10-CM

## 2023-05-23 DIAGNOSIS — F250 Schizoaffective disorder, bipolar type: Secondary | ICD-10-CM

## 2023-05-23 DIAGNOSIS — F200 Paranoid schizophrenia: Secondary | ICD-10-CM

## 2023-05-23 DIAGNOSIS — R635 Abnormal weight gain: Secondary | ICD-10-CM

## 2023-05-23 DIAGNOSIS — G4700 Insomnia, unspecified: Secondary | ICD-10-CM

## 2023-05-23 MED ORDER — UZEDY 100 MG/0.28ML ~~LOC~~ SUSY
100.0000 mg | PREFILLED_SYRINGE | SUBCUTANEOUS | 11 refills | Status: DC
Start: 2023-05-23 — End: 2023-12-05

## 2023-05-23 MED ORDER — UZEDY 100 MG/0.28ML ~~LOC~~ SUSY
100.0000 mg | PREFILLED_SYRINGE | SUBCUTANEOUS | 11 refills | Status: DC
  Filled 2023-05-23: qty 100, fill #0

## 2023-05-23 NOTE — Progress Notes (Cosign Needed)
PATIENT PRESENTS TO THE OFFICE FOR UZEDY 100 MG , WAS GIVEN BY Dianely Krehbiel IN THE LEFT DELTOID PT TOLERATED WELL WILL RETURN IN 28 DAYS

## 2023-05-23 NOTE — Progress Notes (Signed)
BH MD/PA/NP OP Progress Note  05/23/2023 9:36 AM Lawrence [name]  MRN:  [id number]  Chief Complaint: "I am doing okay" Per Grandmother "He has gained weight"  HPI: 27 year old male seen today for follow-up psychiatric evaluation.  He has a psychiatric history of depression, intermittent explosive disorder, schizoaffective disorder bipolar type, and schizophrenia.  Currently he is managed on Uzedy 100 mg monthly.  He informed Clinical research associate that his medication is effective in managing his psychiatric conditions.   Today patient is well-groomed, pleasant, cooperative, and engaged in conversation.  He informed Clinical research associate that he is doing okay.  He informed Clinical research associate that his mood continues to be stable and notes that he has minimal anxiety and depression.  Today provider conducted GAD-7 and patient scored a 0, at his last visit he scored a 9.  Provider also conducted PHQ-9 of patient 3, at last visit he scored a 7.  He endorses adequate sleep and appetite.  Today he denies SI/HI/AVH, mania, paranoia.    Patient was seen with his grandmother who notes that he has an increased appetite and continues to gain weight.  He notes that prior to starting Uzedy he was 180 pounds.  Patient notes 231 pounds. On 8/23/202 4 he was 210 and on 04/24/2022 her was 218. She also informed Clinical research associate that he does not have the drive to do anything and lays on the couch all day.  Provider informed patient that metformin or Wellbutrin could be used to combat weight and help increase his drive.  Provider also informed patient that he could be referred to nutrition for management of his weight.  He will follow-up with his primary care doctor to discuss possible weight loss medications.  At this time he does not wish to start Wellbutrin or metformin.  He does note that he go to the gym twice a week with his mother.  He also notes that [facility] and plans to be more active.   Today provider conducted taking process with  patient scored to 0.  Provider discussed potential of metabolic syndrome developing with patient.  Labs were discussed from patient's last visit.  Today no medication changes made.  Patient agreeable to continue medication as prescribed.  He was referred to nutrition and diabetic management.  Provider will reassess with patient at his next visit about trying metformin or Wellbutrin.  No other concerns noted at this time.  Visit Diagnosis:    ICD-10-CM   1. Gain of weight  R63.5 Ambulatory referral to Nutrition and Diabetic Education    2. Schizoaffective disorder, bipolar type (HCC)  F25.0 risperiDONE ER (UZEDY) 100 MG/0.28ML SUSY        Past Psychiatric History: depression, intermittent explosive disorder, schizoaffective disorder bipolar type, and schizophrenia  Past Medical History:  Past Medical History:  Diagnosis Date   ADD (attention deficit disorder)    Asthma    Depressed    Schizoaffective disorder (HCC)    No past surgical history on file.  Family Psychiatric History: Patient denies family history of psychiatric illness; however, patient's grandmother states that the patient's mother saw a psychologist during her 12th grade year of high school.  Patient denies any family members being on medications at this time.   Family History: No family history on file.  Social History:  Social History   Socioeconomic History   Marital status: Single    Spouse name: Not on file   Number of children: Not on file   Years  of education: Not on file   Highest education level: Not on file  Occupational History   Occupation: Unemployed  Tobacco Use   Smoking status: Former    Current packs/day: 0.00    Types: Cigarettes    Quit date: 04/11/2023    Years since quitting: 0.1   Smokeless tobacco: Never  Vaping Use   Vaping status: Never Used  Substance and Sexual Activity   Alcohol use: Not Currently   Drug use: Not Currently    Frequency: 2.0 times per week    Types:  Marijuana   Sexual activity: Never  Other Topics Concern   Not on file  Social History Narrative   Pt is unemployed; lives with grandmother   Social Determinants of Health   Financial Resource Strain: Not on File (12/07/2021)   Received from [employer], [employer]    Financial Resource Strain: 0  Food Insecurity: Not on File (05/16/2023)   Received from [facility] Insecurity    Food: 0  Transportation Needs: Not on File (12/07/2021)   Received from [employer], [employer] Needs    Transportation: 0  Physical Activity: Not on File (12/07/2021)   Received from [place], [place]   Physical Activity    Physical Activity: 0  Stress: Not on File (12/07/2021)   Received from [facility], [place]   Stress    Stress: 0  Social Connections: Not on File (05/04/2023)   Received from [facility]   Social Connections    Connectedness: 0    Allergies:  Allergies  Allergen Reactions   Bee Venom Anaphylaxis   Apple Juice Other (See Comments)    Reaction unknown   Corn-Containing Products Other (See Comments)    Reaction unknown   Fava Beans     Green beans   Milk (Cow) Itching and Other (See Comments)   Other Rash and Other (See Comments)    Green Beans: Asthma   Peanut Butter Flavor Cough    Metabolic Disorder Labs: Lab Results  Component Value Date   HGBA1C 5.9 (H) 03/26/2023   MPG 120 08/13/2022   MPG 114.02 11/14/2019   Lab Results  Component Value Date   PROLACTIN 64.5 (H) 03/26/2023   PROLACTIN 10.6 12/16/2020   Lab Results  Component Value Date   CHOL 122 08/13/2022   TRIG 44 08/13/2022   HDL 52 08/13/2022   CHOLHDL 2.3 08/13/2022   VLDL 9 08/13/2022   LDLCALC 61 08/13/2022   LDLCALC 69 10/30/2021   Lab Results  Component Value Date   TSH 1.960 03/26/2023   TSH 1.547 08/13/2022    Therapeutic Level Labs: No results found for: "LITHIUM" No results found for: "VALPROATE" Lab Results  Component Value Date   CBMZ 8.1 11/14/2019    Current  Medications: Current Outpatient Medications  Medication Sig Dispense Refill   risperiDONE ER (UZEDY) 100 MG/0.28ML SUSY Inject 0.28 mLs (100 mg total) into the skin every 28 (twenty-eight) days. 100 mL 11   Current Facility-Administered Medications  Medication Dose Route Frequency Provider Last Rate Last Admin   risperiDONE ER SUSY 100 mg  100 mg Subcutaneous Q28 days Toy Cookey E, NP   100 mg at 05/23/23 1610     Musculoskeletal: Strength & Muscle Tone: within normal limits Gait & Station: normal Patient leans: N/A  Psychiatric Specialty Exam: Review of Systems  There were no vitals taken for this visit.There is no height or weight on file to calculate BMI.  General Appearance:  Well Groomed  Eye Contact:  Good  Speech:  Clear and Coherent and Normal Rate  Volume:  Normal  Mood:  Euthymic  Affect:  Appropriate and Congruent  Thought Process:  Coherent, Goal Directed, and Linear  Orientation:  Full (Time, Place, and Person)  Thought Content: WDL and Logical   Suicidal Thoughts:  No  Homicidal Thoughts:  No  Memory:  Immediate;   Good Recent;   Good Remote;   Good  Judgement:  Good  Insight:  Good  Psychomotor Activity:  Normal  Concentration:  Concentration: Good and Attention Span: Good  Recall:  Good  Fund of Knowledge: Good  Language: Good  Akathisia:  No  Handed:  Right  AIMS (if indicated): done, 0  Assets:  Communication Skills Desire for Improvement Financial Resources/Insurance Housing Leisure Time Physical Health Social Support Transportation  ADL's:  Intact  Cognition: WNL  Sleep:  Good   Screenings: AIMS    Flowsheet Row Office Visit from 12/25/2022 in [facility]) [facility] (Dis[facility]) [facility]  AIMS Total Score 0 0 0 0 0      AUDIT    Flowsheet Row Admission (Discharged) from [facility] (Dis[facility]) [facility] (Discharged) [facility] (Dis[facility]) [facility]  Alcohol Use Disorder Identification Test Final Score (AUDIT) 0 0 0 3      GAD-7    Flowsheet Row Office Visit from 05/23/2023 in [facility] Clinical Support from 02/28/2023 in [facility] Office Visit from 12/25/2022 in [facility] Clinical Support from 11/28/2022 in [facility] Clinical Support from 10/16/2022 in [facility]  Total GAD-7 Score 0 9 8 3 7       PHQ2-9    Flowsheet Row Office Visit from 05/23/2023 in [facility] Clinical Support from 02/28/2023 in [facility] Office Visit from 12/25/2022 in [facility] Clinical Support from 11/28/2022 in [facility] Clinical Support from 10/16/2022 in [facility]  PHQ-2 Total Score 2 3 3  0 2  PHQ-9 Total Score 3 7 6  -- 8      Flowsheet Row Office Visit from 05/23/2023 in [facility] Clinical Support from 02/28/2023 in [facility] Office Visit from 12/25/2022 in [facility]  C-SSRS RISK CATEGORY Error: Q3, 4, or 5 should not be populated when Q2 is No Error: Q3, 4, or 5 should not be populated when Q2 is No Error: Q7 should not be populated when Q6 is No        Assessment and Plan:  Patient notes that he is doing well on his current medication regimen.  Patient continues to gain weight however.  He was referred to nutrition and diabetic education.  Provider offered patient Wellbutrin or metformin however at this time he is not interested.  Provider also encouraged patient to talk to his PCP about weight loss medication.  Provider discussed potential of metabolic  syndrome developing with patient.  Labs were discussed from patient's last visit.  No medication changes made today.  Patient agreeable to continue medication as prescribed.  1. Schizoaffective disorder, bipolar type (HCC)  Continue- risperiDONE ER (UZEDY) 100 MG/0.28ML SUSY; Inject 0.28 mLs (100 mg total) into the skin every 28 (twenty-eight) days.  Dispense: 100 mL; Refill: 11  2. Gain of weight  - Ambulatory referral to Nutrition and Diabetic Education       Collaboration of Care: Collaboration of Care: Other provider involved in patient's care [facility] staff and PCP  Patient/Guardian was advised Release of Information must be obtained prior to any record release in order to collaborate their care with an outside provider. Patient/Guardian was advised if they have not already done so to contact the registration department to sign all necessary forms in order for [place] to release information regarding their care.   Consent: Patient/Guardian gives verbal consent for treatment and assignment of benefits for services provided during this visit. Patient/Guardian expressed understanding and agreed to proceed.   Follow up in 2.5 months Follow up with shot clinic in a month Shanna Cisco, NP 05/23/2023, 9:36 AM

## 2023-06-17 ENCOUNTER — Telehealth (HOSPITAL_COMMUNITY): Payer: Self-pay | Admitting: Psychiatry

## 2023-06-20 ENCOUNTER — Ambulatory Visit (HOSPITAL_COMMUNITY): Payer: MEDICAID

## 2023-06-25 ENCOUNTER — Encounter (HOSPITAL_COMMUNITY): Payer: Self-pay

## 2023-06-25 ENCOUNTER — Ambulatory Visit (INDEPENDENT_AMBULATORY_CARE_PROVIDER_SITE_OTHER): Payer: MEDICAID | Admitting: *Deleted

## 2023-06-25 VITALS — BP 128/61 | HR 79 | Resp 16 | Ht 73.0 in | Wt 242.0 lb

## 2023-06-25 DIAGNOSIS — F250 Schizoaffective disorder, bipolar type: Secondary | ICD-10-CM

## 2023-06-25 NOTE — Progress Notes (Cosign Needed)
Patient arrived for his injection of Uzedy 100mg . Given in the back of his Right arm. States he still has mild hallucinations but no auditory. Denies SI/HI. Pleasant, cooperative. Denies side effects. Will return in 28 days. NCD  [id number] lot #[id number],

## 2023-06-28 ENCOUNTER — Encounter: Payer: MEDICAID | Attending: Family Medicine | Admitting: Dietician

## 2023-06-28 DIAGNOSIS — R635 Abnormal weight gain: Secondary | ICD-10-CM | POA: Diagnosis present

## 2023-06-28 DIAGNOSIS — Z713 Dietary counseling and surveillance: Secondary | ICD-10-CM | POA: Diagnosis not present

## 2023-06-28 DIAGNOSIS — Z833 Family history of diabetes mellitus: Secondary | ICD-10-CM | POA: Diagnosis not present

## 2023-06-28 NOTE — Progress Notes (Signed)
Medical Nutrition Therapy  Appointment Start time:  [phone]  Appointment End time:  [phone]  Primary concerns today: weight gain, family history of diabetes  Referral diagnosis: Gain of weight  Preferred learning style:  no preference indicated Learning readiness:  change in progress   NUTRITION ASSESSMENT  Clinical Medical Hx:  Past Medical History:  Diagnosis Date   ADD (attention deficit disorder)    Asthma    Depressed    Schizoaffective disorder (HCC)     Medications:  Current Outpatient Medications:    risperiDONE ER (UZEDY) 100 MG/0.28ML SUSY, Inject 0.28 mLs (100 mg total) into the skin every 28 (twenty-eight) days., Disp: 100 mL, Rfl: 11  Current Facility-Administered Medications:    risperiDONE ER SUSY 100 mg, 100 mg, Subcutaneous, Q28 days, Toy Cookey E, NP, 100 mg at 06/25/23 1037   Labs:  Lab Results  Component Value Date   CHOL 122 08/13/2022   HDL 52 08/13/2022   LDLCALC 61 08/13/2022   TRIG 44 08/13/2022   CHOLHDL 2.3 08/13/2022   Lab Results  Component Value Date   HGBA1C 5.9 (H) 03/26/2023     Lifestyle & Dietary Hx Pt presents today with his grandmother. Pt reports a desire to connect with therapy-RD provided resources. Pt reports making recent changing including decreasing soda intake and increasing water intake. Pt reports he has access to dumbbells at home and enjoys swimming.  Pt reports a family history of type 2 diabetes.  Estimated daily fluid intake: 66 oz+ Supplements: none Sleep: 11 hours day Stress / self-care: 7 of out 10/ breathing exercises, meditation Current average weekly physical activity: walks daily for 15-20 minutes; enjoys swimming and weight lifting   24-Hr Dietary Recall First Meal: ~9 am: eggs, bacon or sausage, dr pepper Snack: none Second Meal: ~10-11 am: 2 red baron pepperoni frozen pizza, water Snack:  sometimes chips   Third Meal: ~4 pm: zatarans chicken alfredo, water Snack: none Beverages: water, cranberry  juice, occasionally, dr pepper  NUTRITION DIAGNOSIS  NB-1.1 Food and nutrition-related knowledge deficit As related to no prior nutrition related .  As evidenced by Pt report and dietary recall .   NUTRITION INTERVENTION  Nutrition education (E-1) on the following topics:  Fruits & Vegetables: Aim to fill half your plate with a variety of fruits and vegetables. They are rich in vitamins, minerals, and fiber, and can help reduce the risk of chronic diseases. Choose a colorful assortment of fruits and vegetables to ensure you get a wide range of nutrients. Grains and Starches: Make at least half of your grain choices whole grains, such as brown rice, whole wheat bread, and oats. Whole grains provide fiber, which aids in digestion and healthy cholesterol levels. Aim for whole forms of starchy vegetables such as potatoes, sweet potatoes, beans, peas, and corn, which are fiber rich and provide many vitamins and minerals.  Protein: Incorporate lean sources of protein, such as poultry, fish, beans, nuts, and seeds, into your meals. Protein is essential for building and repairing tissues, staying full, balancing blood sugar, as well as supporting immune function. Dairy: Include low-fat or fat-free dairy products like milk, yogurt, and cheese in your diet. Dairy foods are excellent sources of calcium and vitamin D, which are crucial for bone health.  Physical Activity: Aim for 60 minutes of physical activity daily. Regular physical activity promotes overall health-including helping to reduce risk for heart disease and diabetes, promoting mental health, and helping [place] sleep better.    Handouts Provided Include  Snack List Designer, industrial/product Food List   Learning Style & Readiness for Change Teaching method utilized: Visual & Auditory  Demonstrated degree of understanding via: Teach Back  Barriers to learning/adherence to lifestyle change: unknown  Goals Established by Pt 1- Start 2-3 days weekly walking or  weightlifting at home or swimming  Aim 30 minutes on M/W/F 10am    MONITORING & EVALUATION Dietary intake, weekly physical activity  Next Steps  Patient is to return PRN.

## 2023-06-28 NOTE — Patient Instructions (Signed)
1- Start 2-3 days weekly walking or weightlifting at home or swimming  Aim 30 minutes on M/W/F 10am

## 2023-07-15 ENCOUNTER — Ambulatory Visit (INDEPENDENT_AMBULATORY_CARE_PROVIDER_SITE_OTHER): Payer: MEDICAID | Admitting: Family Medicine

## 2023-07-15 ENCOUNTER — Encounter: Payer: Self-pay | Admitting: Family Medicine

## 2023-07-15 VITALS — BP 108/70 | HR 74 | Temp 98.2°F | Ht 73.0 in | Wt 248.5 lb

## 2023-07-15 DIAGNOSIS — R7303 Prediabetes: Secondary | ICD-10-CM | POA: Diagnosis not present

## 2023-07-15 DIAGNOSIS — Z23 Encounter for immunization: Secondary | ICD-10-CM | POA: Diagnosis not present

## 2023-07-15 LAB — POCT GLYCOSYLATED HEMOGLOBIN (HGB A1C): Hemoglobin A1C: 5.7 % — AB (ref 4.0–5.6)

## 2023-07-15 NOTE — Progress Notes (Signed)
   Established Patient Office Visit  Subjective   Patient ID: Lawrence [name], male    DOB: 26-Nov-1995  Age: 27 y.o. MRN: [id number]  Chief Complaint  Patient presents with   Medical Management of Chronic Issues    Pt is here for follow up on his A1C. He reports he has gained a lot of weight in the past few months, state sthat he is now seeing a nutritionist and he is starting back with exercise. A1C performed in office today and is improved, down to 5.7 from 5.9. pt states he did get rid of regular sodas in his diet and is eating more salads.     Current Outpatient Medications  Medication Instructions   Uzedy 100 mg, Subcutaneous, Every 28 days      Review of Systems  All other systems reviewed and are negative.     Objective:     BP 108/70 (BP Location: Left Arm, Patient Position: Sitting, Cuff Size: Large)   Pulse 74   Temp 98.2 F (36.8 C) (Oral)   Ht 6\' 1"  (1.854 m)   Wt 248 lb 8 oz (112.7 kg)   SpO2 98%   BMI 32.79 kg/m    Physical Exam Vitals reviewed.  Constitutional:      Appearance: Normal appearance. He is well-groomed and overweight.  Cardiovascular:     Rate and Rhythm: Normal rate and regular rhythm.     Heart sounds: S1 normal and S2 normal. No murmur heard. Pulmonary:     Effort: Pulmonary effort is normal.     Breath sounds: Normal air entry.  Neurological:     General: No focal deficit present.     Mental Status: He is alert and oriented to person, place, and time.     Gait: Gait is intact.  Psychiatric:        Mood and Affect: Mood and affect normal.      Results for orders placed or performed in visit on 07/15/23  POC HgB A1c  Result Value Ref Range   Hemoglobin A1C 5.7 (A) 4.0 - 5.6 %   HbA1c POC (<> result, manual entry)     HbA1c, POC (prediabetic range)     HbA1c, POC (controlled diabetic range)        The ASCVD Risk score (Arnett DK, et al., 2019) failed to calculate for the following reasons:   The 2019 ASCVD risk  score is only valid for ages 12 to 65    Assessment & Plan:  Prediabetes Assessment & Plan: A1C in office today is slightly improved with his dietary changes, however pt has gained about 38 pounds since August, likely due to the risperidone. He is now seeing a dietician and is starting to exercise more. We discussed possibly starting metformin today however pt wants to wait for now. I will see him back in 6 months for his annual physical.   Orders: -     POCT glycosylated hemoglobin (Hb A1C)  Need for immunization against influenza -     Flu vaccine trivalent PF, 6mos and older(Flulaval,Afluria,Fluarix,Fluzone)  Immunization due -     Tdap vaccine greater than or equal to 7yo IM     Return in about 6 months (around 01/12/2024) for annual physical exam.    Karie Georges, MD

## 2023-07-15 NOTE — Assessment & Plan Note (Signed)
A1C in office today is slightly improved with his dietary changes, however pt has gained about 38 pounds since August, likely due to the risperidone. He is now seeing a dietician and is starting to exercise more. We discussed possibly starting metformin today however pt wants to wait for now. I will see him back in 6 months for his annual physical.

## 2023-07-23 ENCOUNTER — Ambulatory Visit (HOSPITAL_COMMUNITY): Payer: MEDICAID

## 2023-08-15 ENCOUNTER — Ambulatory Visit (HOSPITAL_COMMUNITY): Payer: No Payment, Other

## 2023-08-15 ENCOUNTER — Telehealth (HOSPITAL_COMMUNITY): Payer: Self-pay

## 2023-08-15 ENCOUNTER — Encounter (HOSPITAL_COMMUNITY): Payer: Self-pay

## 2023-08-15 ENCOUNTER — Ambulatory Visit (HOSPITAL_COMMUNITY): Payer: MEDICAID

## 2023-08-15 VITALS — BP 127/73 | HR 72 | Temp 98.7°F | Ht 72.5 in | Wt 237.0 lb

## 2023-08-15 DIAGNOSIS — F250 Schizoaffective disorder, bipolar type: Secondary | ICD-10-CM

## 2023-08-15 NOTE — Telephone Encounter (Signed)
Medication management - Call with patient to follow up on message left. Dsicussed with patient his past due Uzedy injection and patient agreed to come into the [facility] outpatient later today at 2 pm for the past due injection. Discussed with Dr. Doyne Keel who approved patient to have his past due injection this date if no issues.

## 2023-08-15 NOTE — Progress Notes (Cosign Needed)
Patient in today for past due Uzedy 100 mg/0.28 ML subcutaneous every 28 day injection that was past due.  Patient presented with appropriate affect, level mood and denied any current auditory or visual hallucinations, no suicidal or homicidal ideations and no plans, intent or means to want to harm self or others at this time.  Patient reported no current concerns and stated he had stopped all smoking of cigarettes and marijuana at least a few months back.  Patient's past due Uzedy 100 mg/0.28 ml SQ injection prepared as ordered and given to patient in his left arm.  Patient tolerated due injection without complaint of pain or discomfort and agreed to return in 28 days for his next injection and to see the provider at that time.  Patient reported no other issues at this time and agreed to call our office if any worsening of symptoms or problems from injection.

## 2023-08-20 ENCOUNTER — Ambulatory Visit (HOSPITAL_COMMUNITY): Payer: No Payment, Other

## 2023-08-27 ENCOUNTER — Ambulatory Visit (HOSPITAL_COMMUNITY): Payer: No Payment, Other

## 2023-08-30 ENCOUNTER — Ambulatory Visit: Payer: MEDICAID | Admitting: Dietician

## 2023-09-06 ENCOUNTER — Encounter (HOSPITAL_COMMUNITY): Payer: Self-pay

## 2023-09-09 ENCOUNTER — Encounter (HOSPITAL_COMMUNITY): Payer: Self-pay

## 2023-09-09 ENCOUNTER — Ambulatory Visit (INDEPENDENT_AMBULATORY_CARE_PROVIDER_SITE_OTHER): Payer: MEDICAID | Admitting: Clinical

## 2023-09-09 DIAGNOSIS — F250 Schizoaffective disorder, bipolar type: Secondary | ICD-10-CM

## 2023-09-09 DIAGNOSIS — F1220 Cannabis dependence, uncomplicated: Secondary | ICD-10-CM

## 2023-09-12 ENCOUNTER — Encounter (HOSPITAL_COMMUNITY): Payer: Self-pay

## 2023-09-12 ENCOUNTER — Ambulatory Visit (INDEPENDENT_AMBULATORY_CARE_PROVIDER_SITE_OTHER): Payer: MEDICAID

## 2023-09-12 ENCOUNTER — Other Ambulatory Visit (HOSPITAL_BASED_OUTPATIENT_CLINIC_OR_DEPARTMENT_OTHER): Payer: Self-pay

## 2023-09-12 ENCOUNTER — Ambulatory Visit (HOSPITAL_COMMUNITY): Payer: MEDICAID | Admitting: Psychiatry

## 2023-09-12 VITALS — BP 115/66 | HR 77 | Ht 73.5 in | Wt 249.9 lb

## 2023-09-12 DIAGNOSIS — F250 Schizoaffective disorder, bipolar type: Secondary | ICD-10-CM

## 2023-09-12 DIAGNOSIS — F1290 Cannabis use, unspecified, uncomplicated: Secondary | ICD-10-CM

## 2023-09-12 MED ORDER — TRAZODONE HCL 50 MG PO TABS
50.0000 mg | ORAL_TABLET | Freq: Every evening | ORAL | 1 refills | Status: DC | PRN
  Filled 2023-09-12: qty 30, 30d supply, fill #0

## 2023-09-12 NOTE — Progress Notes (Signed)
Pt presents today for injection of UZEDY administered in Right arm 100 mg, which was tolerated successfully. Pt states he has not been experiencing any AVH,SI,and HI. PT presents to be well groomed and has no complaints for provider.

## 2023-09-12 NOTE — Patient Instructions (Signed)
Thank you for attending your appointment today.  -- START trazodone 50 mg nightly as needed for sleep -- Continue other medications as prescribed.  Please do not make any changes to medications without first discussing with your provider. If you are experiencing a psychiatric emergency, please call 911 or present to your nearest emergency department. Additional crisis, medication management, and therapy resources are included below.  [facility]  [address] [place], [place] [address] [phone] WALK-IN URGENT CARE 24/7 FOR ANYONE [address], [place], [place]  [phone] Fax: [phone] [email or web address] *Interpreters available *Accepts all insurance and uninsured for Urgent Care needs *Accepts Medicaid and uninsured for outpatient treatment (below)      ONLY FOR [facility]  Below:    Outpatient New Patient Assessment/Therapy Walk-ins:        Monday, Wednesday, and Thursday 8am until slots are full (first come, first served)                   New Patient Psychiatry/Medication Management        Monday-Friday 8am-11am (first come, first served)               For all walk-ins we ask that you arrive by 7:15am, because patients will be seen in the order of arrival.

## 2023-09-12 NOTE — Progress Notes (Signed)
BH MD Outpatient Progress Note  09/12/2023 4:57 PM [name]  MRN:  [id number]  Assessment:  Lawrence [name] presents for follow-up evaluation. Today, 09/12/23, patient reports emergence of mood instability this interval characterized by increased irritability and low mood fluctuating with periods of elevated mood, grandiosity, racing thoughts, and difficulty sleeping concerning for mixed episode.  This appears to correspond with missed injection at beginning of December although patient has since returned to regular monthly dosing schedule.  Discussed option to increase Uzedy dose however both patient and grandmother prefer to remain at current dosing and monitor symptoms as he restabilizes on injection as they feel this dose had been working well to maintain stability previously.  While he endorses infrequent passive SI, he denies active SI and emergency resources were reviewed.  He currently denies AVH, IOR, or paranoia.  No acute safety concerns at this time.  Will make available trazodone to facilitate sleep and importance of routine and behavioral activation during the day was reviewed extensively.  Brief motivational interviewing was also utilized to promote cannabis cessation; he presents as contemplative at this time.  RTC in 1 month for next injection; will have patient be seen by medication management provider for check in.  Identifying Information: Lawrence [name] is a 28 y.o. male with a history of schizoaffective disorder bipolar type, intermittent explosive disorder, and cannabis use who is an established patient with [facility] for management of schizoaffective disorder.   Plan:  # Schizoaffective disorder bipolar type  Past medication trials:  Status of problem: Mild exacerbation Interventions: -- Continue Uzedy 100 mg Q28 days -- START trazodone 50 mg nightly PRN sleep  -- Per chart review, patient has been on this medication before and tolerated  well.  Given documented corn allergy on chart, counseled both patient and grandmother to discontinue medication if experiencing any adverse effects.  They deny history of anaphylaxis to corn products.  # Cannabis use Status of problem: chronic Interventions: -- Continue to monitor and promote cessation  Patient was given contact information for behavioral health clinic and was instructed to call 911 for emergencies.   Subjective:  Chief Complaint:  Chief Complaint  Patient presents with   Medication Management    Interval History:   Lawrence [name] is seen with his grandmother.   He states sometimes he feels irritable in response to overstimulation - helps to go for a walk, watch TV, seek solitude. There have been times in which he gets verbally argumentative; grandmother states this has occurred twice in the last month but was short lived and he was redirectable. No physical aggression or destruction to property.   Outside of these moments, describes mood as "decent" but later states he has been feeling depressed "almost all the time."  Reports energy as "not great but not terrible." Reports spending a lot of time on phone and watching TV. Wishes he spent more time pursuing art and getting back to the gym. Reports low motivation to do these things. Has been meeting with nutritionist and feels he has been eating healthier (more fruits, veggies, more water).   Reports occasional passive SI but denies active SI. Reports this is recent to the past week. States that if these thoughts worsened, would talk to family. Denies HI. Denies AVH currently although experienced this in the past - last experienced AH 4 months ago and VH in December (looking for patterns not really there). Denies IOR or paranoia currently although has experienced this in the past.  Appetite is stable.   Endorses cannabis use daily - 2-3 blunts daily. Quit tobacco 3-4 months - reports quitting was the "best thing ever." Feels  cigarettes worsened his depression. Identifies that cannabis likely worsens motivation.   Sleeping "horribly" with poor quality. Taking frequent naps during the day.  Grandmother states he is typically not falling asleep until 3 or 4 AM.  She reports he has been pacing at night.  He reports that at night he feels more "manic" - endorses grandiosity and that "God is telling him secrets", racing thoughts, restlessness and this lasts about 6 hours. Denies feeling compelled to carry out any particular mission for God.   Discussed concern for mixed mood episode and consideration of increase in [place].  However grandmother made this provider aware that he had missed injection at beginning of December -  She feels that worsening sleep and mood symptoms were related to missed injection dose.  They are amenable to continuing to monitor symptoms as he restabilizes on his on monthly injection. Will start trazodone to facilitate sleep -patient remembers being on this medication before and per chart review has tolerated well.  Given documentation of corn allergy in chart, counseled on discontinuing medication if experiences any adverse effects although patient denies anaphylaxis when exposed to corn products.  Reviewed importance of routine and minimizing naps during the day; counseled extensively on behavioral activation strategies and discussed concept of SMART goals.  Patient identified goal to go to the gym once before next injection appointment.   Visit Diagnosis:    ICD-10-CM   1. Schizoaffective disorder, bipolar type (HCC)  F25.0     2. Use of cannabis  F12.90       Past Psychiatric History:  Diagnoses: schizoaffective disorder bipolar type, intermittent explosive disorder, cannabis use Suicide attempts: denies Substance use:   -- Etoh: denies  -- Tobacco: quit approx Sept 2024  -- Cannabis: 2-3 blunts daily  -- Other illicit drugs: denies  Past Medical History:  Past Medical History:  Diagnosis  Date   ADD (attention deficit disorder)    Asthma    Depressed    Schizoaffective disorder (HCC)    History reviewed. No pertinent surgical history.   Family History: History reviewed. No pertinent family history.  Social History:   Social History   Socioeconomic History   Marital status: Single    Spouse name: Not on file   Number of children: Not on file   Years of education: Not on file   Highest education level: Not on file  Occupational History   Occupation: Unemployed  Tobacco Use   Smoking status: Former    Current packs/day: 0.00    Types: Cigarettes    Quit date: 04/11/2023    Years since quitting: 0.4   Smokeless tobacco: Never  Vaping Use   Vaping status: Never Used  Substance and Sexual Activity   Alcohol use: Not Currently   Drug use: Not Currently    Frequency: 2.0 times per week    Types: Marijuana   Sexual activity: Never  Other Topics Concern   Not on file  Social History Narrative   Pt is unemployed; lives with grandmother   Social Drivers of Corporate investment banker Strain: Not on File (12/07/2021)   Received from [employer] [facility], [employer]    Financial Resource Strain: 0  Food Insecurity: No Food Insecurity (06/28/2023)   Hunger Vital Sign    Worried About Running Out of Food in the  Last Year: Never true    Ran Out of Food in the Last Year: Never true  Transportation Needs: Not on File (12/07/2021)   Received from [facility], [employer] Needs    Transportation: 0  Physical Activity: Not on File (12/07/2021)   Received from [place], [place]   Physical Activity    Physical Activity: 0  Stress: Not on File (12/07/2021)   Received from [facility], [place]   Stress    Stress: 0  Social Connections: Not on File (05/04/2023)   Received from [facility]   Social Connections    Connectedness: 0    Allergies:  Allergies  Allergen Reactions   Bee Venom Anaphylaxis   Apple Juice Other (See Comments)    Reaction unknown    Corn-Containing Products Other (See Comments)    Reaction unknown   Fava Beans     Green beans   Milk (Cow) Itching and Other (See Comments)   Other Rash and Other (See Comments)    Green Beans: Asthma   Peanut Butter Flavoring Agent (Non-Screening) Cough    Current Medications: Current Outpatient Medications  Medication Sig Dispense Refill   risperiDONE ER (UZEDY) 100 MG/0.28ML SUSY Inject 0.28 mLs (100 mg total) into the skin every 28 (twenty-eight) days. 100 mL 11   traZODone (DESYREL) 50 MG tablet Take 1 tablet (50 mg total) by mouth at bedtime as needed for sleep. 30 tablet 1   Current Facility-Administered Medications  Medication Dose Route Frequency Provider Last Rate Last Admin   risperiDONE ER SUSY 100 mg  100 mg Subcutaneous Q28 days Toy Cookey E, NP   100 mg at 09/12/23 1600    ROS: Denies any physical complaints  Objective:  Psychiatric Specialty Exam: There were no vitals taken for this visit.There is no height or weight on file to calculate BMI.  General Appearance: Casual and Well Groomed  Eye Contact:  Good  Speech:  Clear and Coherent and Normal Rate  Volume:  Normal  Mood:   "Irritable sometimes"  Affect:   Euthymic; calm; cooperative  Thought Content:  Denies AVH, paranoia, or IOR currently - endorses moments of grandiosity    Suicidal Thoughts:   Endorses fleeting passive SI however denies active SI  Homicidal Thoughts:  No  Thought Process:  Goal Directed and Linear  Orientation:  Full (Time, Place, and Person)    Memory: Grossly intact   Judgment:  Fair  Insight:  Fair  Concentration:  Concentration: Good  Recall: not formally assessed   Fund of Knowledge: Good  Language: Good  Psychomotor Activity:  Normal  Akathisia:  No  AIMS (if indicated): not done  Assets:  Communication Skills Desire for Improvement Housing Leisure Time Physical Health Social Support Talents/Skills Transportation  ADL's:  Intact  Cognition: WNL  Sleep:    Dysregulated   PE: General: well-appearing; no acute distress  Pulm: no increased work of breathing on room air  Strength & Muscle Tone: within normal limits Neuro: no focal neurological deficits observed  Gait & Station: normal  Metabolic Disorder Labs: Lab Results  Component Value Date   HGBA1C 5.7 (A) 07/15/2023   MPG 120 08/13/2022   MPG 114.02 11/14/2019   Lab Results  Component Value Date   PROLACTIN 64.5 (H) 03/26/2023   PROLACTIN 10.6 12/16/2020   Lab Results  Component Value Date   CHOL 122 08/13/2022   TRIG 44 08/13/2022   HDL 52 08/13/2022   CHOLHDL 2.3 08/13/2022   VLDL 9 08/13/2022  LDLCALC 61 08/13/2022   LDLCALC 69 10/30/2021   Lab Results  Component Value Date   TSH 1.960 03/26/2023   TSH 1.547 08/13/2022    Therapeutic Level Labs: No results found for: "LITHIUM" No results found for: "VALPROATE" Lab Results  Component Value Date   CBMZ 8.1 11/14/2019    Screenings:  AIMS    Flowsheet Row Office Visit from 05/23/2023 in [facility] Office Visit from 12/25/2022 in [facility]Dis[facility]) [facility]) [facility]  AIMS Total Score 0 0 0 0 0      AUDIT    Flowsheet Row Admission (Discharged) from [facility]Discharged) [facility] (Discharged) [facility] (Dis[facility]) [facility]  Alcohol Use Disorder Identification Test Final Score (AUDIT) 0 0 0 3      GAD-7    Flowsheet Row Counselor from 09/09/2023 in [facility] Office Visit from 05/23/2023 in [facility] Clinical Support from 02/28/2023 in [facility] Office Visit from 12/25/2022 in [facility] Clinical Support from 11/28/2022 in [facility]  Total GAD-7 Score 12 0 9 8 3       PHQ2-9    Flowsheet Row Counselor from 09/09/2023 in [facility] Nutrition from 06/28/2023 in [facility]  - A [facility] Of [place] Office Visit from 05/23/2023 in [facility] Clinical Support from 02/28/2023 in [facility] Office Visit from 12/25/2022 in [facility]  PHQ-2 Total Score 5 2 2 3 3   PHQ-9 Total Score 13 6 3 7 6       Flowsheet Row Office Visit from 05/23/2023 in [facility] Clinical Support from 02/28/2023 in [facility] Office Visit from 12/25/2022 in [facility]  C-SSRS RISK CATEGORY Error: Q3, 4, or 5 should not be populated when Q2 is No Error: Q3, 4, or 5 should not be populated when Q2 is No Error: Q7 should not be populated when Q6 is No       Collaboration of Care: Collaboration of Care: Medication Management AEB active medication management and Psychiatrist AEB established with psychiatry  Patient/Guardian was advised Release of Information must be obtained prior to any record release in order to collaborate their care with an outside provider. Patient/Guardian was advised if they have not already done so to contact the registration department to sign all necessary forms in order for [place] to release information regarding their care.   Consent: Patient/Guardian gives verbal consent for treatment and assignment of benefits for services provided during this visit. Patient/Guardian expressed understanding and  agreed to proceed.   A total of 45 minutes was spent involved in face to face clinical care, chart review, documentation, and brief behavioral psychotherapy.   Kandi Brusseau A Bowden Boody 09/12/2023, 4:57 PM

## 2023-09-25 ENCOUNTER — Other Ambulatory Visit (HOSPITAL_COMMUNITY)
Admission: EM | Admit: 2023-09-25 | Discharge: 2023-09-28 | Disposition: A | Discharge status: Home / Self Care | Payer: MEDICAID | Source: Home / Self Care | Admitting: Psychiatry

## 2023-09-25 ENCOUNTER — Ambulatory Visit (HOSPITAL_COMMUNITY)
Admission: EM | Admit: 2023-09-25 | Discharge: 2023-09-25 | Disposition: A | Discharge status: Other Acute Inpatient Hospital | Payer: MEDICAID | Attending: Psychiatry | Admitting: Psychiatry

## 2023-09-25 DIAGNOSIS — F259 Schizoaffective disorder, unspecified: Secondary | ICD-10-CM | POA: Insufficient documentation

## 2023-09-25 DIAGNOSIS — F69 Unspecified disorder of adult personality and behavior: Secondary | ICD-10-CM

## 2023-09-25 DIAGNOSIS — F319 Bipolar disorder, unspecified: Secondary | ICD-10-CM | POA: Insufficient documentation

## 2023-09-25 DIAGNOSIS — F339 Major depressive disorder, recurrent, unspecified: Secondary | ICD-10-CM

## 2023-09-25 DIAGNOSIS — Z560 Unemployment, unspecified: Secondary | ICD-10-CM | POA: Diagnosis not present

## 2023-09-25 DIAGNOSIS — F1910 Other psychoactive substance abuse, uncomplicated: Secondary | ICD-10-CM | POA: Diagnosis present

## 2023-09-25 DIAGNOSIS — F1210 Cannabis abuse, uncomplicated: Secondary | ICD-10-CM | POA: Insufficient documentation

## 2023-09-25 DIAGNOSIS — F1220 Cannabis dependence, uncomplicated: Secondary | ICD-10-CM

## 2023-09-25 DIAGNOSIS — Z79899 Other long term (current) drug therapy: Secondary | ICD-10-CM | POA: Insufficient documentation

## 2023-09-25 LAB — POCT URINE DRUG SCREEN - MANUAL ENTRY (I-SCREEN)
POC Amphetamine UR: NOT DETECTED
POC Buprenorphine (BUP): NOT DETECTED
POC Cocaine UR: NOT DETECTED
POC Marijuana UR: POSITIVE — AB
POC Methadone UR: NOT DETECTED
POC Methamphetamine UR: NOT DETECTED
POC Morphine: NOT DETECTED
POC Oxazepam (BZO): NOT DETECTED
POC Oxycodone UR: NOT DETECTED
POC Secobarbital (BAR): NOT DETECTED

## 2023-09-25 MED ORDER — HALOPERIDOL LACTATE 5 MG/ML IJ SOLN: 5.0000 mg | Freq: Three times a day (TID) | INTRAMUSCULAR | Status: DC | PRN

## 2023-09-25 MED ORDER — MAGNESIUM HYDROXIDE 400 MG/5ML PO SUSP: 30.0000 mL | Freq: Every day | ORAL | Status: DC | PRN

## 2023-09-25 MED ORDER — DIPHENHYDRAMINE HCL 50 MG/ML IJ SOLN: 50.0000 mg | Freq: Three times a day (TID) | INTRAMUSCULAR | Status: DC | PRN

## 2023-09-25 MED ORDER — OLANZAPINE 5 MG PO TBDP: 5.0000 mg | ORAL_TABLET | Freq: Three times a day (TID) | ORAL | Status: DC | PRN

## 2023-09-25 MED ORDER — LORAZEPAM 2 MG/ML IJ SOLN: 2.0000 mg | Freq: Three times a day (TID) | INTRAMUSCULAR | Status: DC | PRN

## 2023-09-25 MED ORDER — ALUM & MAG HYDROXIDE-SIMETH 200-200-20 MG/5ML PO SUSP: 30.0000 mL | ORAL | Status: DC | PRN

## 2023-09-25 MED ORDER — HALOPERIDOL LACTATE 5 MG/ML IJ SOLN: 10.0000 mg | Freq: Three times a day (TID) | INTRAMUSCULAR | Status: DC | PRN

## 2023-09-25 MED ORDER — TRAZODONE HCL 50 MG PO TABS
50.0000 mg | ORAL_TABLET | Freq: Every evening | ORAL | Status: DC | PRN
Start: 2023-09-25 — End: 2023-09-28
  Filled 2023-09-25 (×2): qty 1

## 2023-09-25 NOTE — BH Assessment (Addendum)
 Comprehensive Clinical Assessment (CCA) Note  09/25/2023 Lawrence [name] [id number]  Disposition: Per Gaither Pouch, NP, patient meets criteria for [facility] at [facility].   Chief Complaint:  Chief Complaint  Patient presents with   Suicidal   Addiction Problem   Visit Diagnosis:  Cannabis Dependence - 304.30 Schizoaffective Disorder, Bipolar Type - 295.70 Major Depressive Disorder, Single Episode - 296.20  Lawrence [name], a 28 year old male, presents to the [facility] seeking assistance with detoxifying from marijuana use. He is accompanied by his grandmother. Lawrence [name] reports that he began using marijuana at the age of 42, and since then, his use has been daily, with consumption occurring all day. His last use was at 4:00 PM today. He acknowledges a history of marijuana use spanning 9 years, with daily use over the past several years.   Lawrence [name] is currently prescribed medication for his schizoaffective disorder but is not actively seeing a therapist. He denies alcohol use, suicidal ideation (SI), homicidal ideation (HI), auditory or visual hallucinations (AVH), and self-injurious behaviors. Lawrence [name] also denies having access to weapons.   He reports a family history of mental illness, noting that his mother has been diagnosed with obsessive-compulsive disorder (OCD) and depression. Lawrence [name] mentions that he is currently sleeping only 4 to 5 hours per night, though his appetite remains good. He has experienced significant weight loss, which he attributes to the side effects of his psychotropic medications. He also reports experiencing depressive symptoms, including low energy, feelings of sadness, and a lack of motivation.  Lawrence [name] appears well-groomed and in no acute distress. His speech is coherent, and his thought process is logical and goal-directed. He is oriented to time, place, and person. His mood is described as neutral with depressive features, and his affect is congruent with his mood. Lawrence [name] denies any  perceptual disturbances or delusions. His insight into his condition appears fair, and his judgment is intact. He demonstrates some awareness of the need for treatment to address both his marijuana use and the management of his schizoaffective disorder. No signs of cognitive impairment or other significant deficits are noted during the examination.   CCA Screening, Triage and Referral (STR)  Patient Reported Information How did you hear about us ? Family/Friend  What Is the Reason for Your Visit/Call Today? Lawrence [name] is a 28 year old male presenting to [facility] accompanied by his grandmother. Pt reports that he is looking to detox off of marijuana. Pt mentions he has been using off and on for 10 years. Pt reports that he smokes marijuana every day. Pt reports he is taking medication for his schizoaffectice disorder. Pt is not currently seeing a therapist at this time. Pt denies alcohol use, Si, Hi and Avh.  How Long Has This Been Causing You Problems? > than 6 months  What Do You Feel Would Help You the Most Today? Alcohol or Drug Use Treatment (Marijuana Detox)   Have You Recently Had Any Thoughts About Hurting Yourself? No  Are You Planning to Commit Suicide/Harm Yourself At This time? No   Flowsheet Row ED from 09/25/2023 in [facility] Office Visit from 05/23/2023 in [facility] Clinical Support from 02/28/2023 in [facility]  C-SSRS RISK CATEGORY No Risk Error: Q3, 4, or 5 should not be populated when Q2 is No Error: Q3, 4, or 5 should not be populated when Q2 is No       Have you Recently Had Thoughts About Hurting Someone Lawrence [name]? No  Are You Planning  to Harm Someone at This Time? No  Explanation: n/a   Have You Used Any Alcohol or Drugs in the Past 24 Hours? Yes  How Long Ago Did You Use Drugs or Alcohol? Marijuana. Last use today at 4pm.  What Did You Use and How Much? Marijuana; I smoke  all day, every day.   Do You Currently Have a Therapist/Psychiatrist? Yes  Name of Therapist/Psychiatrist: Name of Therapist/Psychiatrist: Patient recevies OP services from [facility]. Patient states he is prescribed medication for symptoms of Schizoaffective Disorder.   Have You Been Recently Discharged From Any Office Practice or Programs? No  Explanation of Discharge From Practice/Program: N/A    CCA Screening Triage Referral Assessment Type of Contact: Face-to-Face  Telemedicine Service Delivery:   Is this Initial or Reassessment?   Date Telepsych consult ordered in [facility]:    Time Telepsych consult ordered in [facility]:    Location of Assessment: [facility] Assessment Services  Provider Location: [facility] Assessment Services   Collateral Involvement: Grandmother who assisted informing TTS of patient's current issues   Does Patient Have a Automotive Engineer Guardian? No  Legal Guardian Contact Information: No legal guardian.  Copy of Legal Guardianship Form: No - copy requested  Legal Guardian Notified of Arrival: -- (n/a)  Legal Guardian Notified of Pending Discharge: -- (n/a)  If Minor and Not Living with Parent(s), Who has Custody? n/a; no legal guardian.  Is CPS involved or ever been involved? Never  Is APS involved or ever been involved? Never   Patient Determined To Be At Risk for Harm To Self or Others Based on Review of Patient Reported Information or Presenting Complaint? No  Method: No Plan  Availability of Means: No access or NA  Intent: Vague intent or NA  Notification Required: No need or identified person  Additional Information for Danger to Others Potential: -- (Patient denies HI and denies that I am a danger to others.)  Additional Comments for Danger to Others Potential: patient's grandmother is concerned over patient's medication non-compliance which she feels is related to patient not meeting with his psych provider on a regular bases  Are There  Guns or Other Weapons in Your Home? No  Types of Guns/Weapons: NA; patient denies access to guns or weapons.  Are These Weapons Safely Secured?                            No  Who Could Verify You Are Able To Have These Secured: NA  Do You Have any Outstanding Charges, Pending Court Dates, Parole/Probation? Patient denies.  Contacted To Inform of Risk of Harm To Self or Others: Other: Comment (Patient denies HI.)    Does Patient Present under Involuntary Commitment? No    [place] of Residence: [place]   Patient Currently Receiving the Following Services: Medication Management   Determination of Need: Urgent (48 hours)   Options For Referral: Medication Management; Inpatient Hospitalization     CCA Biopsychosocial Patient Reported Schizophrenia/Schizoaffective Diagnosis in Past: Yes   Strengths: Patient is currently non compliant with medications and denies any MH symptoms   Mental Health Symptoms Depression:  None   Duration of Depressive symptoms:    Mania:  None   Anxiety:   Irritability   Psychosis:  None   Duration of Psychotic symptoms:    Trauma:  None   Obsessions:  None   Compulsions:  None   Inattention:  None   Hyperactivity/Impulsivity:  N/A  Oppositional/Defiant Behaviors:  Easily annoyed   Emotional Irregularity:  Intense/unstable relationships   Other Mood/Personality Symptoms:  Patient does admitt to above symptoms although feels they are unrelated to mental health and just life    Mental Status Exam Appearance and self-care  Stature:  Average   Weight:  Average weight   Clothing:  Disheveled   Grooming:  Neglected   Cosmetic use:  None   Posture/gait:  Normal   Motor activity:  Not Remarkable   Sensorium  Attention:  Normal   Concentration:  Normal   Orientation:  Object; Person; Place; Situation   Recall/memory:  Normal   Affect and Mood  Affect:  Appropriate   Mood:  Other (Comment)   Relating  Eye  contact:  Normal   Facial expression:  Responsive   Attitude toward examiner:  Appropriate   Thought and Language  Speech flow: Clear and Coherent   Thought content:  Appropriate to Mood and Circumstances   Preoccupation:  None   Hallucinations:  None   Organization:  Goal-directed   Company Secretary of Knowledge:  Average   Intelligence:  Average   Abstraction:  Functional   Judgement:  Good   Reality Testing:  Adequate   Insight:  Good   Decision Making:  Normal   Social Functioning  Social Maturity:  Responsible   Social Judgement:  Normal   Stress  Stressors:  Relationship; Housing   Coping Ability:  Overwhelmed   Skill Deficits:  Self-care   Supports:  Family     Religion: Religion/Spirituality Are You A Religious Person?: No How Might This Affect Treatment?: NA  Leisure/Recreation: Leisure / Recreation Do You Have Hobbies?: No  Exercise/Diet: Exercise/Diet Do You Exercise?: No Have You Gained or Lost A Significant Amount of Weight in the Past Six Months?: No Do You Follow a Special Diet?: No Do You Have Any Trouble Sleeping?: No   CCA Employment/Education Employment/Work Situation: Employment / Work Situation Employment Situation: Unemployed Patient's Lawrence has Been Impacted by Current Illness: No Has Patient ever Been in Equities Trader?: No  Education: Education Is Patient Currently Attending School?: No Last Grade Completed: 12 Did You Product Manager?: No Did You Have An Individualized Education Program (IIEP): No Did You Have Any Difficulty At [employer]?: No Patient's Education Has Been Impacted by Current Illness: No   CCA Family/Childhood History Family and Relationship History: Family history Marital status: Single Does patient have children?: No  Childhood History:  Childhood History By whom was/is the patient raised?: Mother, Grandparents Did patient suffer any verbal/emotional/physical/sexual abuse as a child?:  No Did patient suffer from severe childhood neglect?: No Has patient ever been sexually abused/assaulted/raped as an adolescent or adult?: No Was the patient ever a victim of a crime or a disaster?: No Witnessed domestic violence?: No Has patient been affected by domestic violence as an adult?: No       CCA Substance Use Alcohol/Drug Use: Alcohol / Drug Use Pain Medications: See MAR Prescriptions: See MAR Over the Counter: See MAR History of alcohol / drug use?: Yes Longest period of sobriety (when/how long): n/a Negative Consequences of Use:  (n/a) Withdrawal Symptoms: None Substance #1 Name of Substance 1: THC 1 - Age of First Use: 28 years old 1 - Amount (size/oz): I smoke all day, every day 1 - Frequency: daiy 1 - Duration: on-going 1 - Last Use / Amount: 4pm today; 09/24/2022 1 - Method of Aquiring: varies 1- Route of Use: inhalation  ASAM's:  Six Dimensions of Multidimensional Assessment  Dimension 1:  Acute Intoxication and/or Withdrawal Potential:      Dimension 2:  Biomedical Conditions and Complications:      Dimension 3:  Emotional, Behavioral, or Cognitive Conditions and Complications:     Dimension 4:  Readiness to Change:     Dimension 5:  Relapse, Continued use, or Continued Problem Potential:     Dimension 6:  Recovery/Living Environment:     ASAM Severity Score:    ASAM Recommended Level of Treatment:     Substance use Disorder (SUD)    Recommendations for Services/Supports/Treatments: Recommendations for Services/Supports/Treatments Recommendations For Services/Supports/Treatments: Individual Therapy, Medication Management  Disposition Recommendation per psychiatric provider: We recommend inpatient psychiatric hospitalization when medically cleared. Patient is under voluntary admission status at this time; please IVC if attempts to leave hospital.   DSM5 Diagnoses: Patient Active Problem List   Diagnosis Date  Noted   Substance abuse (HCC) 09/25/2023   Use of cannabis 09/12/2023   Prediabetes 04/12/2023   Well adult exam 02/28/2023   Schizophrenia (HCC) 11/11/2019   Schizoaffective disorder, bipolar type (HCC) 01/09/2017   Intermittent explosive disorder 05/13/2016   Intoxication by drug (HCC) 07/12/2014   Intoxication 07/12/2014   Depression 07/12/2014   Overdose of antitussive       Referrals to Alternative Service(s): Referred to Alternative Service(s):   Place:   Date:   Time:    Referred to Alternative Service(s):   Place:   Date:   Time:    Referred to Alternative Service(s):   Place:   Date:   Time:    Referred to Alternative Service(s):   Place:   Date:   Time:     Cambren Kiang, Counselor

## 2023-09-25 NOTE — ED Notes (Signed)
 Patient is sleeping. Respirations equal and unlabored, skin warm and dry. No change in assessment or acuity. Routine safety checks conducted according to facility protocol. Will continue to monitor for safety.

## 2023-09-25 NOTE — ED Provider Notes (Signed)
 Facility Based Crisis Admission H&P  Date: 09/26/23 Patient Name: Lawrence [name] MRN: [id number] Chief Complaint: need to stop smoking marijuana   Diagnoses:  Final diagnoses:  Marijuana abuse  Recurrent major depressive disorder, remission status unspecified (HCC)  Behavior concern in adult    HPI: Lawrence [name], 28 y/o male with a history of schizoaffective disorder, intermittent explosive disorder, bipolar disorder..  General anxiety disorder, presented to [facility] voluntarily accompanied by his mother.  Per the patient he needs to get rehab for marijuana abuse according to the patient he uses marijuana every single day.  Patient stated he is not employed and all he does every day just smoke marijuana.  Patient stated he is currently not seeing a psychiatrist but has an appointment to see 1 however he is currently taking medicines prescribed by [facility] health.  Patient reports he was hospitalized last year but did not exactly know what he was hospitalized for.    Face-to-face evaluation of patient, patient is alert and oriented x 4, speech is clear, maintain eye contact.  Patient appears very anxious.  However patient is very pleasant and answers questions appropriately.  Patient denies SI, HI, AVH or paranoia.  Denies alcohol use.  Reports he smoked marijuana on a daily and has been smoking for a long time, according to patient he smoked because he is depressed..  Patient denies any other illicit drug use at this time patient denies access to guns denies wanting to hurt himself or others. At this time patient does not seem to be influenced by internal or external stimuli.  PHQ9 ws completed pt scored 8,  PHQ 2-9:  Flowsheet Row ED from 09/25/2023 in [facility] Counselor from 09/09/2023 in [facility] Nutrition from 06/28/2023 in [facility] Nutr Diab Ed  - A [facility] [place]  Thoughts that you would be better off  dead, or of hurting yourself in some way Several days Not at all Not at all  PHQ-9 Total Score 8 13 6        Flowsheet Row ED from 09/25/2023 in [facility] Most recent reading at 09/26/2023 12:14 AM ED from 09/25/2023 in [facility] Most recent reading at 09/25/2023  5:14 PM Office Visit from 05/23/2023 in [facility] Most recent reading at 05/23/2023  9:20 AM  C-SSRS RISK CATEGORY No Risk No Risk Error: Q3, 4, or 5 should not be populated when Q2 is No         Total Time spent with patient: 30 minutes  Musculoskeletal  Strength & Muscle Tone: within normal limits Gait & Station: normal Patient leans: N/A  Psychiatric Specialty Exam  Presentation General Appearance:  Casual  Eye Contact: Good  Speech: Clear and Coherent  Speech Volume: Normal  Handedness: Right   Mood and Affect  Mood: Anxious; Depressed  Affect: Appropriate   Thought Process  Thought Processes: Coherent  Descriptions of Associations:Intact  Orientation:Full (Time, Place and Person)  Thought Content:Logical  Diagnosis of Schizophrenia or Schizoaffective disorder in past: Yes  Duration of Psychotic Symptoms: No data recorded Hallucinations:Hallucinations: None  Ideas of Reference:None  Suicidal Thoughts:Suicidal Thoughts: No  Homicidal Thoughts:Homicidal Thoughts: No   Sensorium  Memory: Immediate Fair  Judgment: Fair  Insight: Fair   Executive Functions  Concentration: Good  Attention Span: Good  Recall: Good  Fund of Knowledge: Good  Language: Good   Psychomotor Activity  Psychomotor Activity:  Psychomotor Activity: Normal   Assets  Assets: Desire for Improvement; Social Support   Sleep  Sleep: Sleep: Fair Number of Hours of Sleep: 6   Nutritional Assessment (For OBS and FBC admissions only) Has the patient had a weight loss or gain of 10 pounds or more in the  last 3 months?: No Has the patient had a decrease in food intake/or appetite?: No Does the patient have dental problems?: No Does the patient have eating habits or behaviors that may be indicators of an eating disorder including binging or inducing vomiting?: No Has the patient recently lost weight without trying?: 0 Has the patient been eating poorly because of a decreased appetite?: 0 Malnutrition Screening Tool Score: 0    Physical Exam HENT:     Head: Normocephalic.     Nose: Nose normal.  Eyes:     Pupils: Pupils are equal, round, and reactive to light.  Cardiovascular:     Rate and Rhythm: Normal rate.  Pulmonary:     Effort: Pulmonary effort is normal.  Musculoskeletal:        General: Normal range of motion.     Cervical back: Normal range of motion.  Neurological:     General: No focal deficit present.     Mental Status: He is alert.  Psychiatric:        Mood and Affect: Mood normal.        Behavior: Behavior normal.        Thought Content: Thought content normal.    Review of Systems  Constitutional: Negative.   HENT: Negative.    Eyes: Negative.   Respiratory: Negative.    Cardiovascular: Negative.   Gastrointestinal: Negative.   Genitourinary: Negative.   Musculoskeletal: Negative.   Skin: Negative.   Neurological: Negative.   Psychiatric/Behavioral:  Positive for substance abuse. The patient is nervous/anxious.     Blood pressure 125/79, pulse 85, resp. rate 20, SpO2 100%. There is no height or weight on file to calculate BMI.  Past Psychiatric History: Schizoaffective disorder  Is the patient at risk to self? No  Has the patient been a risk to self in the past 6 months? No .    Has the patient been a risk to self within the distant past? No   Is the patient a risk to others? No   Has the patient been a risk to others in the past 6 months? No   Has the patient been a risk to others within the distant past? No   Past Medical History: see chart   Family History: unknown  Social History: marijuana   Last Labs:  Admission on 09/25/2023  Component Date Value Ref Range Status   WBC 09/25/2023 9.5  4.0 - 10.5 K/uL Final   RBC 09/25/2023 5.59  4.22 - 5.81 MIL/uL Final   Hemoglobin 09/25/2023 16.0  13.0 - 17.0 g/dL Final   HCT 97/94/7974 49.2  39.0 - 52.0 % Final   MCV 09/25/2023 88.0  80.0 - 100.0 fL Final   MCH 09/25/2023 28.6  26.0 - 34.0 pg Final   MCHC 09/25/2023 32.5  30.0 - 36.0 g/dL Final   RDW 97/94/7974 13.9  11.5 - 15.5 % Final   Platelets 09/25/2023 231  150 - 400 K/uL Final   nRBC 09/25/2023 0.0  0.0 - 0.2 % Final   Neutrophils Relative % 09/25/2023 54  % Final   Neutro Abs 09/25/2023 5.1  1.7 - 7.7 K/uL Final   Lymphocytes Relative 09/25/2023 33  %  Final   Lymphs Abs 09/25/2023 3.2  0.7 - 4.0 K/uL Final   Monocytes Relative 09/25/2023 6  % Final   Monocytes Absolute 09/25/2023 0.6  0.1 - 1.0 K/uL Final   Eosinophils Relative 09/25/2023 5  % Final   Eosinophils Absolute 09/25/2023 0.5  0.0 - 0.5 K/uL Final   Basophils Relative 09/25/2023 1  % Final   Basophils Absolute 09/25/2023 0.1  0.0 - 0.1 K/uL Final   Immature Granulocytes 09/25/2023 1  % Final   Abs Immature Granulocytes 09/25/2023 0.06  0.00 - 0.07 K/uL Final   Performed at [facility], [address], [place], [place] [address]   Sodium 09/25/2023 139  135 - 145 mmol/L Final   Potassium 09/25/2023 4.1  3.5 - 5.1 mmol/L Final   Chloride 09/25/2023 100  98 - 111 mmol/L Final   CO2 09/25/2023 29  22 - 32 mmol/L Final   Glucose, Bld 09/25/2023 92  70 - 99 mg/dL Final   Glucose reference range applies only to samples taken after fasting for at least 8 hours.   BUN 09/25/2023 14  6 - 20 mg/dL Final   Creatinine, Ser 09/25/2023 1.08  0.61 - 1.24 mg/dL Final   Calcium 97/94/7974 9.7  8.9 - 10.3 mg/dL Final   Total Protein 97/94/7974 7.4  6.5 - 8.1 g/dL Final   Albumin 97/94/7974 4.4  3.5 - 5.0 g/dL Final   AST 97/94/7974 25  15 - 41 U/L Final   ALT  09/25/2023 26  0 - 44 U/L Final   Alkaline Phosphatase 09/25/2023 77  38 - 126 U/L Final   Total Bilirubin 09/25/2023 0.6  0.0 - 1.2 mg/dL Final   GFR, Estimated 09/25/2023 >60  >60 mL/min Final   Comment: (NOTE) Calculated using the CKD-EPI Creatinine Equation (2021)    Anion gap 09/25/2023 10  5 - 15 Final   Performed at [facility], [address] [place], [place] [address]   Alcohol, Ethyl (B) 09/25/2023 <10  <10 mg/dL Final   Comment: (NOTE) Lowest detectable limit for serum alcohol is 10 mg/dL.  For medical purposes only. Performed at [facility], [address], [place], [place] [address]    TSH 09/25/2023 5.681 (H)  0.350 - 4.500 uIU/mL Final   Comment: Performed by a 3rd Generation assay with a functional sensitivity of <=0.01 uIU/mL. Performed at [facility], [address], [place], [place] [address]    POC Marijuana UR 09/25/2023 Positive (A)   Final   POC Oxycodone UR 09/25/2023 None Detected   Final   POC Methadone UR 09/25/2023 None Detected   Final   POC Morphine 09/25/2023 None Detected   Final   POC Methamphetamine UR 09/25/2023 None Detected   Final   POC Cocaine UR 09/25/2023 None Detected   Final   POC Oxazepam (BZO) 09/25/2023 None Detected   Final   POC Buprenorphine (BUP) 09/25/2023 None Detected   Final   POC Secobarbital (BAR) 09/25/2023 None Detected   Final   POC Amphetamine UR 09/25/2023 None Detected   Final  Office Visit on 07/15/2023  Component Date Value Ref Range Status   Hemoglobin A1C 07/15/2023 5.7 (A)  4.0 - 5.6 % Final  Orders Only on 03/26/2023  Component Date Value Ref Range Status   WBC 03/26/2023 7.7  3.4 - 10.8 x10E3/uL Final   RBC 03/26/2023 5.29  4.14 - 5.80 x10E6/uL Final   Hemoglobin 03/26/2023 15.9  13.0 - 17.7  g/dL Final   Hematocrit 91/93/7975 47.2  37.5 - 51.0 % Final   MCV 03/26/2023 89  79 - 97 fL Final   MCH 03/26/2023 30.1  26.6 - 33.0 pg Final   MCHC 03/26/2023 33.7  31.5 - 35.7 g/dL Final   RDW  91/93/7975 13.7  11.6 - 15.4 % Final   Platelets 03/26/2023 187  150 - 450 x10E3/uL Final   Neutrophils 03/26/2023 76  Not Estab. % Final   Lymphs 03/26/2023 16  Not Estab. % Final   Monocytes 03/26/2023 5  Not Estab. % Final   Eos 03/26/2023 3  Not Estab. % Final   Basos 03/26/2023 0  Not Estab. % Final   Neutrophils Absolute 03/26/2023 5.8  1.4 - 7.0 x10E3/uL Final   Lymphocytes Absolute 03/26/2023 1.2  0.7 - 3.1 x10E3/uL Final   Monocytes Absolute 03/26/2023 0.4  0.1 - 0.9 x10E3/uL Final   EOS (ABSOLUTE) 03/26/2023 0.3  0.0 - 0.4 x10E3/uL Final   Basophils Absolute 03/26/2023 0.0  0.0 - 0.2 x10E3/uL Final   Immature Granulocytes 03/26/2023 0  Not Estab. % Final   Immature Grans (Abs) 03/26/2023 0.0  0.0 - 0.1 x10E3/uL Final   Glucose 03/26/2023 111 (H)  70 - 99 mg/dL Final   BUN 91/93/7975 9  6 - 20 mg/dL Final   Creatinine, Ser 03/26/2023 1.11  0.76 - 1.27 mg/dL Final   eGFR 91/93/7975 94  >59 mL/min/1.73 Final   BUN/Creatinine Ratio 03/26/2023 8 (L)  9 - 20 Final   Sodium 03/26/2023 140  134 - 144 mmol/L Final   Potassium 03/26/2023 4.1  3.5 - 5.2 mmol/L Final   Chloride 03/26/2023 102  96 - 106 mmol/L Final   CO2 03/26/2023 19 (L)  20 - 29 mmol/L Final   Calcium 03/26/2023 9.6  8.7 - 10.2 mg/dL Final   Total Protein 91/93/7975 7.0  6.0 - 8.5 g/dL Final   Albumin 91/93/7975 4.5  4.3 - 5.2 g/dL Final   Globulin, Total 03/26/2023 2.5  1.5 - 4.5 g/dL Final   Bilirubin Total 03/26/2023 0.3  0.0 - 1.2 mg/dL Final   Alkaline Phosphatase 03/26/2023 89  44 - 121 IU/L Final   AST 03/26/2023 38  0 - 40 IU/L Final   ALT 03/26/2023 34  0 - 44 IU/L Final   Bilirubin, Direct 03/26/2023 <0.10  0.00 - 0.40 mg/dL Final   TSH 91/93/7975 1.960  0.450 - 4.500 uIU/mL Final   T4, Total 03/26/2023 7.1  4.5 - 12.0 ug/dL Final   T3 Uptake Ratio 03/26/2023 28  24 - 39 % Final   Free Thyroxine Index 03/26/2023 2.0  1.2 - 4.9 Final   Hgb A1c MFr Bld 03/26/2023 5.9 (H)  4.8 - 5.6 % Final   Comment:           Prediabetes: 5.7 - 6.4          Diabetes: >6.4          Glycemic control for adults with diabetes: <7.0    Est. average glucose Bld gHb Est-m* 03/26/2023 123  mg/dL Final   Prolactin 91/93/7975 64.5 (H)  3.6 - 31.5 ng/mL Final   specimen status report 03/26/2023 Comment   Final   Comment: Please note Please note The date and/or time of collection was not indicated on the requisition as required by state and federal law.  The date of receipt of the specimen was used as the collection date if not supplied.     Allergies: Bee  venom, Apple juice, Corn-containing products, Fava beans, Milk (cow), Other, and Peanut butter flavoring agent (non-screening)  Medications:  Facility Ordered Medications  Medication   risperiDONE  ER SUSY 100 mg   alum & mag hydroxide-simeth (MAALOX/MYLANTA) 200-200-20 MG/5ML suspension 30 mL   magnesium  hydroxide (MILK OF MAGNESIA) suspension 30 mL   OLANZapine  zydis (ZYPREXA ) disintegrating tablet 5 mg   haloperidol  lactate (HALDOL ) injection 5 mg   And   diphenhydrAMINE  (BENADRYL ) injection 50 mg   And   LORazepam  (ATIVAN ) injection 2 mg   haloperidol  lactate (HALDOL ) injection 10 mg   And   diphenhydrAMINE  (BENADRYL ) injection 50 mg   And   LORazepam  (ATIVAN ) injection 2 mg   traZODone  (DESYREL ) tablet 50 mg   PTA Medications  Medication Sig   risperiDONE  ER (UZEDY ) 100 MG/0.28ML SUSY Inject 0.28 mLs (100 mg total) into the skin every 28 (twenty-eight) days.   traZODone  (DESYREL ) 50 MG tablet Take 1 tablet (50 mg total) by mouth at bedtime as needed for sleep.    Long Term Goals: Improvement in symptoms so as ready for discharge  Short Term Goals: Patient will verbalize feelings in meetings with treatment team members., Patient will attend at least of 50% of the groups daily., Pt will complete the PHQ9 on admission, day 3 and discharge., Patient will participate in completing the [place] Suicide Severity Rating Scale, Patient will score a low  risk of violence for 24 hours prior to discharge, and Patient will take medications as prescribed daily.  Medical Decision Making  Inpatient [facility]      Recommendations  Based on my evaluation the patient does not appear to have an emergency medical condition.  Gaither Pouch, NP 09/26/23  5:11 AM

## 2023-09-25 NOTE — Progress Notes (Signed)
   09/25/23 1653  [facility] Triage Screening (Walk-ins at [facility] only)  How Did You Hear About Us ? Family/Friend  What Is the Reason for Your Visit/Call Today? Lawrence [name] is a 28 year old male presenting to [facility] accompanied by his grandmother. Pt reports that he is looking to detox off of marijuana. Pt mentions he has been using off and on for 10 years. Pt reports that he smokes marijuana every day. Pt reports he is taking medication for his schizoaffectice disorder. Pt is not currently seeing a therapist at this time. Pt denies alcohol use, Si, Hi and Avh.  How Long Has This Been Causing You Problems? > than 6 months  Have You Recently Had Any Thoughts About Hurting Yourself? No  Are You Planning to Commit Suicide/Harm Yourself At This time? No  Have you Recently Had Thoughts About Hurting Someone Sherral? No  Are You Planning To Harm Someone At This Time? No  Physical Abuse Denies  Verbal Abuse Denies  Sexual Abuse Denies  Exploitation of patient/patient's resources Denies  Self-Neglect Denies  Possible abuse reported to: Other (Comment)  Are you currently experiencing any auditory, visual or other hallucinations? No  Have You Used Any Alcohol or Drugs in the Past 24 Hours? Yes  What Did You Use and How Much? marijuana (less than 2 blunts)  Do you have any current medical co-morbidities that require immediate attention? No  Clinician description of patient physical appearance/behavior: calm, cooperative  What Do You Feel Would Help You the Most Today? Stress Management  If access to [facility] was not available, would you have sought care in the Emergency Department? No  Determination of Need Routine (7 days)  Options For Referral Outpatient Therapy

## 2023-09-26 ENCOUNTER — Inpatient Hospital Stay (HOSPITAL_COMMUNITY): Admission: EM | Admit: 2023-09-26 | Payer: No Payment, Other | Source: Intra-hospital

## 2023-09-26 DIAGNOSIS — F1210 Cannabis abuse, uncomplicated: Secondary | ICD-10-CM | POA: Diagnosis not present

## 2023-09-26 DIAGNOSIS — F259 Schizoaffective disorder, unspecified: Secondary | ICD-10-CM | POA: Diagnosis not present

## 2023-09-26 DIAGNOSIS — Z560 Unemployment, unspecified: Secondary | ICD-10-CM | POA: Diagnosis not present

## 2023-09-26 DIAGNOSIS — F319 Bipolar disorder, unspecified: Secondary | ICD-10-CM | POA: Diagnosis not present

## 2023-09-26 LAB — CBC WITH DIFFERENTIAL/PLATELET
Abs Immature Granulocytes: 0.06 10*3/uL (ref 0.00–0.07)
Basophils Absolute: 0.1 10*3/uL (ref 0.0–0.1)
Basophils Relative: 1 %
Eosinophils Absolute: 0.5 10*3/uL (ref 0.0–0.5)
Eosinophils Relative: 5 %
HCT: 49.2 % (ref 39.0–52.0)
Hemoglobin: 16 g/dL (ref 13.0–17.0)
Immature Granulocytes: 1 %
Lymphocytes Relative: 33 %
Lymphs Abs: 3.2 10*3/uL (ref 0.7–4.0)
MCH: 28.6 pg (ref 26.0–34.0)
MCHC: 32.5 g/dL (ref 30.0–36.0)
MCV: 88 fL (ref 80.0–100.0)
Monocytes Absolute: 0.6 10*3/uL (ref 0.1–1.0)
Monocytes Relative: 6 %
Neutro Abs: 5.1 10*3/uL (ref 1.7–7.7)
Neutrophils Relative %: 54 %
Platelets: 231 10*3/uL (ref 150–400)
RBC: 5.59 MIL/uL (ref 4.22–5.81)
RDW: 13.9 % (ref 11.5–15.5)
WBC: 9.5 10*3/uL (ref 4.0–10.5)
nRBC: 0 % (ref 0.0–0.2)

## 2023-09-26 LAB — COMPREHENSIVE METABOLIC PANEL
ALT: 26 U/L (ref 0–44)
AST: 25 U/L (ref 15–41)
Albumin: 4.4 g/dL (ref 3.5–5.0)
Alkaline Phosphatase: 77 U/L (ref 38–126)
Anion gap: 10 (ref 5–15)
BUN: 14 mg/dL (ref 6–20)
CO2: 29 mmol/L (ref 22–32)
Calcium: 9.7 mg/dL (ref 8.9–10.3)
Chloride: 100 mmol/L (ref 98–111)
Creatinine, Ser: 1.08 mg/dL (ref 0.61–1.24)
GFR, Estimated: >60 mL/min (ref >60)
Glucose, Bld: 92 mg/dL (ref 70–99)
Potassium: 4.1 mmol/L (ref 3.5–5.1)
Sodium: 139 mmol/L (ref 135–145)
Total Bilirubin: 0.6 mg/dL (ref 0.0–1.2)
Total Protein: 7.4 g/dL (ref 6.5–8.1)

## 2023-09-26 LAB — ETHANOL: Alcohol, Ethyl (B): <10 mg/dL (ref <10)

## 2023-09-26 LAB — TSH: TSH: 5.681 u[IU]/mL — ABNORMAL HIGH (ref 0.350–4.500)

## 2023-09-26 NOTE — Progress Notes (Signed)
Pt is awake, alert and oriented X4. Pt did not voice any complaints of pain or discomfort. No signs of acute distress noted. Pt denies current SI/HI/AVH, plan or intent. Staff will monitor for pt's safety. 

## 2023-09-26 NOTE — Progress Notes (Signed)
 Pt stayed in his room most of this shift but woke up for meals. No distress noted or concerns voiced. Staff will monitor for pt's safety.

## 2023-09-26 NOTE — Progress Notes (Signed)
 Pt was accepted to [facility] TODAY   09/26/2023; Bed Assignment 404-1  IK:rjwwjapd dependence, schizoaffective d/o bipolar; mdd single episode  Pt meets inpatient criteria per Gaither Trudy PIETY  Attending Physician will be Dr. Levada Pillar, MD   Report can be called to:  -Adult unit: [phone]  Pt can arrive after: [facility] to coordinate with care team.  Care Team notified:Marlyn Bernardo,RN, Jocelyn Wilson, Night [facility] Kim Brooks, RN, [address]   [place], LCSWA 09/26/2023 @ 1:14 AM

## 2023-09-26 NOTE — Group Note (Signed)
 Group Topic: Social Support  Group Date: 09/26/2023 Start Time: 1900 End Time: 1915 Facilitators: Joan Plowman B  Department: [facility]  Number of Participants: 3  Group Focus: check in Treatment Modality:  Individual Therapy Interventions utilized were support Purpose: express feelings  Name: Lawrence [name] Date of Birth: [birth year]  MR: [id number]    Level of Participation: active Quality of Participation: cooperative Interactions with others: gave feedback Mood/Affect: appropriate Triggers (if applicable): NA Cognition: coherent/clear Progress: Gaining insight Response: NA Plan: patient will be encouraged to keep going to groups.   Patients Problems:  Patient Active Problem List   Diagnosis Date Noted   Substance abuse (HCC) 09/25/2023   Use of cannabis 09/12/2023   Prediabetes 04/12/2023   Well adult exam 02/28/2023   Schizophrenia (HCC) 11/11/2019   Schizoaffective disorder, bipolar type (HCC) 01/09/2017   Intermittent explosive disorder 05/13/2016   Intoxication by drug (HCC) 07/12/2014   Intoxication 07/12/2014   Depression 07/12/2014   Overdose of antitussive 

## 2023-09-26 NOTE — Group Note (Signed)
 Group Topic: Healthy Self Image and Positive Change  Group Date: 09/26/2023 Start Time: 1145 End Time: 1149 Facilitators: Brien Can B  Department: [facility]  Number of Participants: 6  Group Focus: activities of daily living skills and self-awareness Treatment Modality:  Psychoeducation Interventions utilized were patient education and problem solving Purpose: increase insight and reinforce self-care  Name: Lawrence [name] Date of Birth: 1996/07/25  MR: [id number]    Level of Participation: active Quality of Participation: attentive and cooperative Interactions with others: N/A Mood/Affect: positive Triggers (if applicable): N/A Cognition: coherent/clear Progress: Moderate Response: Pt had no question and was not really focused in this group setting Plan: follow-up needed  Patients Problems:  Patient Active Problem List   Diagnosis Date Noted   Substance abuse (HCC) 09/25/2023   Use of cannabis 09/12/2023   Prediabetes 04/12/2023   Well adult exam 02/28/2023   Schizophrenia (HCC) 11/11/2019   Schizoaffective disorder, bipolar type (HCC) 01/09/2017   Intermittent explosive disorder 05/13/2016   Intoxication by drug (HCC) 07/12/2014   Intoxication 07/12/2014   Depression 07/12/2014   Overdose of antitussive 

## 2023-09-26 NOTE — ED Notes (Signed)
 Pt is in his room resting in bed. Pt denies SI/HI/AVH. No acute distress noted. Will continue to monitor for safety.

## 2023-09-26 NOTE — ED Provider Notes (Signed)
 Behavioral Health Progress Note  Date and Time: 09/26/2023 4:40 PM Name: Lawrence [name] MRN:  [id number]  Subjective:  Lawrence [name] is a 28 y.o. male, with a history of schizoaffective disorder, intermittent explosive disorder, bipolar disorder, and generalized anxiety disorder, voluntarily presented to [facility] accompanied by his mother. He reports a need for rehabilitation to address his marijuana abuse, stating that he uses marijuana every day. He is compliant with his medications at this time and has not been disruptive while on the unit.    During the face-to-face evaluation, the patient was alert and oriented to person, place, time, and situation. His speech was clear, and he maintained eye contact throughout the conversation. The patient describes his mood as chill but irritated and stated that he is seeking detoxification from marijuana. He denies experiencing any physical symptoms and reports that his eating and sleeping patterns are stable at this time. The patient was pleasant, smiling, engaging well, and providing appropriate responses. While the patient appeared anxious, he was very pleasant and responded appropriately to questions. He denied any suicidal ideation (SI), homicidal ideation (HI), auditory or visual hallucinations (AVH), or paranoia. He also denied alcohol use. He reported smoking marijuana daily for an extended period, attributing his use to feelings of depression. When asked to rate his depression and anxiety on a Likert scale from 0 to 10, with 10 being the worst, he rated both as 0/10. He denied using any other illicit substances and also denied access to firearms. At this time, the patient did not appear to be influenced by internal or external stimuli.  Diagnosis:  Final diagnoses:  None    Total Time spent with patient: 20 minutes  Past Psychiatric History:  Past Medical History: Family History:  Family Psychiatric  History:  Social History:   Additional  Social History:    Pain Medications: See MAR Prescriptions: See MAR Over the Counter: See MAR History of alcohol / drug use?: Yes Longest period of sobriety (when/how long): n/a Negative Consequences of Use:  (n/a) Withdrawal Symptoms: None Name of Substance 1: THC 1 - Age of First Use: 28 years old 1 - Amount (size/oz): I smoke all day, every day 1 - Frequency: daiy 1 - Duration: on-going 1 - Last Use / Amount: 4pm today; 09/24/2022 1 - Method of Aquiring: varies 1- Route of Use: inhalation     Sleep: Fair  Appetite:  Fair  Current Medications:  Current Facility-Administered Medications  Medication Dose Route Frequency Provider Last Rate Last Admin   alum & mag hydroxide-simeth (MAALOX/MYLANTA) 200-200-20 MG/5ML suspension 30 mL  30 mL Oral Q4H PRN Trudy Carwin, NP       haloperidol  lactate (HALDOL ) injection 5 mg  5 mg Intramuscular TID PRN Trudy Carwin, NP       And   diphenhydrAMINE  (BENADRYL ) injection 50 mg  50 mg Intramuscular TID PRN Trudy Carwin, NP       And   LORazepam  (ATIVAN ) injection 2 mg  2 mg Intramuscular TID PRN Trudy Carwin, NP       haloperidol  lactate (HALDOL ) injection 10 mg  10 mg Intramuscular TID PRN Trudy Carwin, NP       And   diphenhydrAMINE  (BENADRYL ) injection 50 mg  50 mg Intramuscular TID PRN Trudy Carwin, NP       And   LORazepam  (ATIVAN ) injection 2 mg  2 mg Intramuscular TID PRN Trudy Carwin, NP       magnesium  hydroxide (MILK OF MAGNESIA) suspension 30 mL  30 mL Oral Daily PRN Trudy Carwin, NP       OLANZapine  zydis (ZYPREXA ) disintegrating tablet 5 mg  5 mg Oral TID PRN Trudy Carwin, NP       risperiDONE  ER SUSY 100 mg  100 mg Subcutaneous Q28 days Parsons, Brittney E, NP   100 mg at 09/12/23 1600   traZODone  (DESYREL ) tablet 50 mg  50 mg Oral QHS PRN Trudy Carwin, NP       Current Outpatient Medications  Medication Sig Dispense Refill   risperiDONE  ER (UZEDY ) 100 MG/0.28ML SUSY Inject 0.28 mLs (100 mg total) into the skin  every 28 (twenty-eight) days. 100 mL 11   traZODone  (DESYREL ) 50 MG tablet Take 1 tablet (50 mg total) by mouth at bedtime as needed for sleep. (Patient not taking: Reported on 09/26/2023) 30 tablet 1    Labs  Lab Results:  Admission on 09/25/2023  Component Date Value Ref Range Status   WBC 09/25/2023 9.5  4.0 - 10.5 K/uL Final   RBC 09/25/2023 5.59  4.22 - 5.81 MIL/uL Final   Hemoglobin 09/25/2023 16.0  13.0 - 17.0 g/dL Final   HCT 97/94/7974 49.2  39.0 - 52.0 % Final   MCV 09/25/2023 88.0  80.0 - 100.0 fL Final   MCH 09/25/2023 28.6  26.0 - 34.0 pg Final   MCHC 09/25/2023 32.5  30.0 - 36.0 g/dL Final   RDW 97/94/7974 13.9  11.5 - 15.5 % Final   Platelets 09/25/2023 231  150 - 400 K/uL Final   nRBC 09/25/2023 0.0  0.0 - 0.2 % Final   Neutrophils Relative % 09/25/2023 54  % Final   Neutro Abs 09/25/2023 5.1  1.7 - 7.7 K/uL Final   Lymphocytes Relative 09/25/2023 33  % Final   Lymphs Abs 09/25/2023 3.2  0.7 - 4.0 K/uL Final   Monocytes Relative 09/25/2023 6  % Final   Monocytes Absolute 09/25/2023 0.6  0.1 - 1.0 K/uL Final   Eosinophils Relative 09/25/2023 5  % Final   Eosinophils Absolute 09/25/2023 0.5  0.0 - 0.5 K/uL Final   Basophils Relative 09/25/2023 1  % Final   Basophils Absolute 09/25/2023 0.1  0.0 - 0.1 K/uL Final   Immature Granulocytes 09/25/2023 1  % Final   Abs Immature Granulocytes 09/25/2023 0.06  0.00 - 0.07 K/uL Final   Performed at [facility], [address] [place], [place] [address]   Sodium 09/25/2023 139  135 - 145 mmol/L Final   Potassium 09/25/2023 4.1  3.5 - 5.1 mmol/L Final   Chloride 09/25/2023 100  98 - 111 mmol/L Final   CO2 09/25/2023 29  22 - 32 mmol/L Final   Glucose, Bld 09/25/2023 92  70 - 99 mg/dL Final   Glucose reference range applies only to samples taken after fasting for at least 8 hours.   BUN 09/25/2023 14  6 - 20 mg/dL Final   Creatinine, Ser 09/25/2023 1.08  0.61 - 1.24 mg/dL Final   Calcium 97/94/7974 9.7  8.9 - 10.3 mg/dL  Final   Total Protein 09/25/2023 7.4  6.5 - 8.1 g/dL Final   Albumin 97/94/7974 4.4  3.5 - 5.0 g/dL Final   AST 97/94/7974 25  15 - 41 U/L Final   ALT 09/25/2023 26  0 - 44 U/L Final   Alkaline Phosphatase 09/25/2023 77  38 - 126 U/L Final   Total Bilirubin 09/25/2023 0.6  0.0 - 1.2 mg/dL Final   GFR, Estimated 09/25/2023 >60  >60 mL/min  Final   Comment: (NOTE) Calculated using the CKD-EPI Creatinine Equation (2021)    Anion gap 09/25/2023 10  5 - 15 Final   Performed at [facility], [address], [place], [place] [address]   Alcohol, Ethyl (B) 09/25/2023 <10  <10 mg/dL Final   Comment: (NOTE) Lowest detectable limit for serum alcohol is 10 mg/dL.  For medical purposes only. Performed at [facility], [address], [place], [place] [address]    TSH 09/25/2023 5.681 (H)  0.350 - 4.500 uIU/mL Final   Comment: Performed by a 3rd Generation assay with a functional sensitivity of <=0.01 uIU/mL. Performed at [facility], [address] [place], [place] [address]    POC Marijuana UR 09/25/2023 Positive (A)   Final   POC Oxycodone UR 09/25/2023 None Detected   Final   POC Methadone UR 09/25/2023 None Detected   Final   POC Morphine 09/25/2023 None Detected   Final   POC Methamphetamine UR 09/25/2023 None Detected   Final   POC Cocaine UR 09/25/2023 None Detected   Final   POC Oxazepam (BZO) 09/25/2023 None Detected   Final   POC Buprenorphine (BUP) 09/25/2023 None Detected   Final   POC Secobarbital (BAR) 09/25/2023 None Detected   Final   POC Amphetamine UR 09/25/2023 None Detected   Final  Office Visit on 07/15/2023  Component Date Value Ref Range Status   Hemoglobin A1C 07/15/2023 5.7 (A)  4.0 - 5.6 % Final    Blood Alcohol level:  Lab Results  Component Value Date   ETH <10 09/25/2023   ETH <10 08/14/2021    Metabolic Disorder Labs: Lab Results  Component Value Date   HGBA1C 5.7 (A) 07/15/2023   MPG 120 08/13/2022   MPG 114.02 11/14/2019    Lab Results  Component Value Date   PROLACTIN 64.5 (H) 03/26/2023   PROLACTIN 10.6 12/16/2020   Lab Results  Component Value Date   CHOL 122 08/13/2022   TRIG 44 08/13/2022   HDL 52 08/13/2022   CHOLHDL 2.3 08/13/2022   VLDL 9 08/13/2022   LDLCALC 61 08/13/2022   LDLCALC 69 10/30/2021    Therapeutic Lab Levels: No results found for: LITHIUM No results found for: VALPROATE Lab Results  Component Value Date   CBMZ 8.1 11/14/2019    Physical Findings   AIMS    Flowsheet Row Office Visit from 05/23/2023 in [facility] Office Visit from 12/25/2022 in [facility]Dis[facility]) [facility]  AIMS Total Score 0 0 0 0 0      AUDIT    Flowsheet Row Admission (Discharged) from [facility] (Discharged) [facility] (Dis[facility] (Dis[facility]) [facility]  Alcohol Use Disorder Identification Test Final Score (AUDIT) 0 0 0 3      GAD-7    Flowsheet Row Counselor from 09/09/2023 in [facility] Office Visit from 05/23/2023 in [facility] Clinical Support from 02/28/2023 in [facility] Office Visit from 12/25/2022 in [facility] Clinical Support from 11/28/2022 in  [facility]  Total GAD-7 Score 12 0 9 8 3       PHQ2-9    Flowsheet Row ED from 09/25/2023 in [facility] Counselor from  09/09/2023 in [facility] Nutrition from 06/28/2023 in [facility]  - [facility] [place] Office Visit from 05/23/2023 in [facility] Clinical Support from 02/28/2023 in [facility]  PHQ-2 Total Score 3 5 2 2 3   PHQ-9 Total Score 8 13 6 3 7       Flowsheet Row ED from 09/25/2023 in [facility] Most recent reading at 09/26/2023 12:14 AM ED from 09/25/2023 in [facility] Most recent reading at 09/25/2023  5:14 PM Office Visit from 05/23/2023 in [facility] Most recent reading at 05/23/2023  9:20 AM  C-SSRS RISK CATEGORY No Risk No Risk Error: Q3, 4, or 5 should not be populated when Q2 is No        Musculoskeletal  Strength & Muscle Tone: within normal limits Gait & Station: normal Patient leans: N/A  Psychiatric Specialty Exam  Presentation  General Appearance:  Casual  Eye Contact: Good  Speech: Clear and Coherent  Speech Volume: Normal  Handedness: Right   Mood and Affect  Mood: Anxious; Depressed  Affect: Appropriate   Thought Process  Thought Processes: Coherent  Descriptions of Associations:Intact  Orientation:Full (Time, Place and Person)  Thought Content:Logical  Diagnosis of Schizophrenia or Schizoaffective disorder in past: Yes  Duration of Psychotic Symptoms: No data recorded  Hallucinations:Hallucinations: None  Ideas of Reference:None  Suicidal Thoughts:Suicidal Thoughts: No  Homicidal Thoughts:Homicidal Thoughts: No   Sensorium  Memory: Immediate Fair  Judgment: Fair  Insight: Fair   Executive Functions  Concentration: Good  Attention Span: Good  Recall: Good  Fund of Knowledge: Good  Language: Good   Psychomotor Activity  Psychomotor Activity: Psychomotor Activity: Normal   Assets  Assets: Desire for Improvement;  Social Support   Sleep  Sleep: Sleep: Fair Number of Hours of Sleep: 6   Nutritional Assessment (For OBS and FBC admissions only) Has the patient had a weight loss or gain of 10 pounds or more in the last 3 months?: No Has the patient had a decrease in food intake/or appetite?: No Does the patient have dental problems?: No Does the patient have eating habits or behaviors that may be indicators of an eating disorder including binging or inducing vomiting?: No Has the patient recently lost weight without trying?: 0 Has the patient been eating poorly because of a decreased appetite?: 0 Malnutrition Screening Tool Score: 0    Physical Exam  Physical Exam Vitals and nursing note reviewed.  Constitutional:      Appearance: Normal appearance. He is normal weight.  Skin:    General: Skin is warm.  Neurological:     General: No focal deficit present.     Mental Status: He is alert and oriented to person, place, and time. Mental status is at baseline.  Psychiatric:        Mood and Affect: Mood normal.        Behavior: Behavior normal.        Thought Content: Thought content normal.        Judgment: Judgment normal.    Review of Systems  Psychiatric/Behavioral:  Positive for substance abuse (THC). The patient is not nervous/anxious.   All other  systems reviewed and are negative.  Blood pressure 134/82, pulse 80, temperature 98.1 F (36.7 C), temperature source Oral, resp. rate 16, SpO2 97%. There is no height or weight on file to calculate BMI.  Treatment Plan Summary: Daily contact with patient to assess and evaluate symptoms and progress in treatment, Medication management, and Plan   -Continue current prn medications -Continue agitation medications at this time, hx of IED -Recommend ongoing group and supportive therapy and a referral to IOP at this time.    Majel GORMAN Ramp, FNP 09/26/2023 4:40 PM

## 2023-09-26 NOTE — Group Note (Signed)
 Group Topic: Wellness  Group Date: 09/26/2023 Start Time: 1700 End Time: 1720 Facilitators: Sherleen Primmer, RN  Department: [facility]  Number of Participants: 7  Group Focus: relapse prevention and self-awareness Treatment Modality:  Psychoeducation Interventions utilized were exploration, patient education, problem solving, and support Purpose: increase insight and relapse prevention strategies  Name: Lawrence [name] Date of Birth: August 22, 1995  MR: [id number]    Level of Participation: active Quality of Participation: attentive, cooperative, and engaged Interactions with others: gave feedback Mood/Affect: appropriate Triggers (if applicable): none Cognition: coherent/clear, goal directed, insightful, and logical Progress: Gaining insight Response: Pt participated in group and gave positive feedback. Plan: follow-up needed  Patients Problems:  Patient Active Problem List   Diagnosis Date Noted   Substance abuse (HCC) 09/25/2023   Use of cannabis 09/12/2023   Prediabetes 04/12/2023   Well adult exam 02/28/2023   Schizophrenia (HCC) 11/11/2019   Schizoaffective disorder, bipolar type (HCC) 01/09/2017   Intermittent explosive disorder 05/13/2016   Intoxication by drug (HCC) 07/12/2014   Intoxication 07/12/2014   Depression 07/12/2014   Overdose of antitussive 

## 2023-09-26 NOTE — ED Notes (Signed)
 Patient is sleeping. Respirations equal and unlabored, skin warm and dry. No change in assessment or acuity. Routine safety checks conducted according to facility protocol. Will continue to monitor for safety.

## 2023-09-26 NOTE — ED Notes (Signed)
 Pt presents to [facility] to detox from [facility]. Skin assessment and searched completed.Admission process completed. Pt is oriented to the unit and provided with meal pt refused. Pt denies HI/AVH. Will continue to monitor for safety and provide support.

## 2023-09-26 NOTE — Progress Notes (Signed)
 Pt meets inpatient behavioral health placement per Butch Cashing. This CSW requested [facility] Shelbie Dess, RN to review. 1st shift CSW to follow up.    Andrew Banister, MSW, LCSWA 09/26/2023 12:52 AM

## 2023-09-27 ENCOUNTER — Telehealth (HOSPITAL_COMMUNITY): Payer: Self-pay | Admitting: *Deleted

## 2023-09-27 DIAGNOSIS — Z560 Unemployment, unspecified: Secondary | ICD-10-CM | POA: Diagnosis not present

## 2023-09-27 DIAGNOSIS — F319 Bipolar disorder, unspecified: Secondary | ICD-10-CM | POA: Diagnosis not present

## 2023-09-27 DIAGNOSIS — F259 Schizoaffective disorder, unspecified: Secondary | ICD-10-CM | POA: Diagnosis not present

## 2023-09-27 DIAGNOSIS — F1210 Cannabis abuse, uncomplicated: Secondary | ICD-10-CM | POA: Diagnosis not present

## 2023-09-27 NOTE — Group Note (Signed)
 Group Topic: Communication  Group Date: 09/27/2023 Start Time: 0900 End Time: 1000 Facilitators: Herold Lajuana NOVAK, RN  Department: [facility]  Number of Participants: 7  Group Focus: communication Treatment Modality:  Individual Therapy Interventions utilized were leisure development Purpose: increase insight  Name: Lawrence [name] Date of Birth: 05-05-1996  MR: [id number]    Level of Participation: active Quality of Participation: attentive Interactions with others: gave feedback Mood/Affect: appropriate Triggers (if applicable): none identified Cognition: coherent/clear Progress: Gaining insight Response: Pt verbalized understanding of indication of each medication given Plan: patient will be encouraged to notify staff with any questions or concerns about medication given  Patients Problems:  Patient Active Problem List   Diagnosis Date Noted   Substance abuse (HCC) 09/25/2023   Use of cannabis 09/12/2023   Prediabetes 04/12/2023   Well adult exam 02/28/2023   Schizophrenia (HCC) 11/11/2019   Schizoaffective disorder, bipolar type (HCC) 01/09/2017   Intermittent explosive disorder 05/13/2016   Intoxication by drug (HCC) 07/12/2014   Intoxication 07/12/2014   Depression 07/12/2014   Overdose of antitussive 

## 2023-09-27 NOTE — Telephone Encounter (Signed)
 Fax received for PA of Uzedy , submitted online with cover my meds. Awaiting decision.

## 2023-09-27 NOTE — Telephone Encounter (Signed)
 Fax received for prior authorization approval of Uzedy  100mg . Notified pharmacy.

## 2023-09-27 NOTE — ED Notes (Signed)
 Pt is in the bedroom calm and sleeping. NAD Respirations are even and unlabored. Will continue to monitor for safety.

## 2023-09-27 NOTE — ED Notes (Signed)
 Patient A&Ox4. Denies intent to harm self/others when asked. Denies A/VH. Patient denies any physical complaints when asked. No acute distress noted. Pt states, I feel great. Support and praise provided. Routine safety checks conducted according to facility protocol. Encouraged patient to notify staff if thoughts of harm toward self or others arise. Patient verbalize understanding and agreement. Will continue to monitor for safety.

## 2023-09-27 NOTE — [facility] Group Notes (Signed)
 Spirituality group facilitated by Elia Donnice Luria, MDiv, BCC.  Group Description: Group focused on topic of hope. Patients participated in facilitated discussion around topic, connecting with one another around experiences and definitions for hope. Group members engaged with visual explorer photos, reflecting on what hope looks like for them today. Group engaged in discussion around how their definitions of hope are present today in hospital.  Modalities: Psycho-social ed, Adlerian, Narrative, MI  Patient Progress: Present throughout group.  Engaged openly in group discussion.

## 2023-09-27 NOTE — ED Notes (Signed)
 Pt sleeping in no acute distress. RR even and unlabored. Environment secured. Will continue to monitor for safety.

## 2023-09-27 NOTE — ED Notes (Signed)
 Patient in the Dayroom watching TV with other patients. Seen calm and pleasant. Denies SI/HI/AVH. NAD Respirations even and unlabored. Will keep monitoring for safety

## 2023-09-27 NOTE — ED Provider Notes (Signed)
 Behavioral Health Progress Note  Date and Time: 09/27/2023 10:17 AM Name: Lawrence [name] MRN:  [id number]  Subjective:  Lawrence [name] is a 28 y.o. male, with a history of schizoaffective disorder, intermittent explosive disorder, bipolar disorder, and generalized anxiety disorder, voluntarily presented to [facility] accompanied by his mother. He reports a need for rehabilitation to address his marijuana abuse, stating that he uses marijuana every day. He is compliant with his medications at this time and has not been disruptive while on the unit.     During the face-to-face evaluation, the patient was alert and oriented to person, place, time, and situation. His speech was clear, and he maintained eye contact throughout the conversation. He described his mood as happy and reported that his marijuana detox is going well. He expressed interest in seeking outpatient therapy for marijuana use. Initially, he mentioned inpatient rehab for three months and expressed a desire to stay here for 5-6 days. The discussion focused on his goals of maintaining sobriety and the recovery process, noting that extended inpatient treatment may not be necessary for marijuana detox. He expressed a commitment to staying away from marijuana for as long as possible.  The patient demonstrated insight and judgment while discussing his relapse prevention plan and goals. He reported drowsiness as his only withdrawal symptom at this time. He denied experiencing depression or anxiety, rating both as 0/10 on a Likert scale. Throughout the evaluation, he was pleasant, smiling, and engaged appropriately in conversation. Although he appeared mildly anxious, he was cooperative and provided appropriate responses. He denied any suicidal ideation (SI), homicidal ideation (HI), auditory or visual hallucinations (AVH), or paranoia. Additionally, he denied alcohol use, the use of other illicit substances, and access to firearms. There was no  indication that he was influenced by internal or external stimuli during the evaluation. No significant changes were noted from the previous assessment.  Diagnosis:  Final diagnoses:  None    Total Time spent with patient: 20 minutes  Past Psychiatric History: IED, schizoaffective disorder, gad. He is currently under the care of Dr. Donn at [facility] outpatient.  Past Medical History: Prediabetes, obesity Family History: Denies Family Psychiatric  History: None  Social History:   Additional Social History:    Pain Medications: See MAR Prescriptions: See MAR Over the Counter: See MAR History of alcohol / drug use?: Yes Longest period of sobriety (when/how long): n/a Negative Consequences of Use:  (n/a) Withdrawal Symptoms: None Name of Substance 1: THC 1 - Age of First Use: 28 years old 1 - Amount (size/oz): I smoke all day, every day 1 - Frequency: daiy 1 - Duration: on-going 1 - Last Use / Amount: 4pm today; 09/24/2022 1 - Method of Aquiring: varies 1- Route of Use: inhalation     Sleep: Fair  Appetite:  Fair  Current Medications:  Current Facility-Administered Medications  Medication Dose Route Frequency Provider Last Rate Last Admin   alum & mag hydroxide-simeth (MAALOX/MYLANTA) 200-200-20 MG/5ML suspension 30 mL  30 mL Oral Q4H PRN Trudy Carwin, NP       haloperidol  lactate (HALDOL ) injection 5 mg  5 mg Intramuscular TID PRN Trudy Carwin, NP       And   diphenhydrAMINE  (BENADRYL ) injection 50 mg  50 mg Intramuscular TID PRN Trudy Carwin, NP       And   LORazepam  (ATIVAN ) injection 2 mg  2 mg Intramuscular TID PRN Trudy Carwin, NP       haloperidol  lactate (HALDOL ) injection 10 mg  10 mg Intramuscular TID PRN Trudy Carwin, NP       And   diphenhydrAMINE  (BENADRYL ) injection 50 mg  50 mg Intramuscular TID PRN Trudy Carwin, NP       And   LORazepam  (ATIVAN ) injection 2 mg  2 mg Intramuscular TID PRN Trudy Carwin, NP       magnesium  hydroxide (MILK OF  MAGNESIA) suspension 30 mL  30 mL Oral Daily PRN Trudy Carwin, NP       OLANZapine  zydis (ZYPREXA ) disintegrating tablet 5 mg  5 mg Oral TID PRN Trudy Carwin, NP       risperiDONE  ER SUSY 100 mg  100 mg Subcutaneous Q28 days Parsons, Brittney E, NP   100 mg at 09/12/23 1600   traZODone  (DESYREL ) tablet 50 mg  50 mg Oral QHS PRN Trudy Carwin, NP       Current Outpatient Medications  Medication Sig Dispense Refill   risperiDONE  ER (UZEDY ) 100 MG/0.28ML SUSY Inject 0.28 mLs (100 mg total) into the skin every 28 (twenty-eight) days. 100 mL 11   traZODone  (DESYREL ) 50 MG tablet Take 1 tablet (50 mg total) by mouth at bedtime as needed for sleep. (Patient not taking: Reported on 09/26/2023) 30 tablet 1    Labs  Lab Results:  Admission on 09/25/2023  Component Date Value Ref Range Status   WBC 09/25/2023 9.5  4.0 - 10.5 K/uL Final   RBC 09/25/2023 5.59  4.22 - 5.81 MIL/uL Final   Hemoglobin 09/25/2023 16.0  13.0 - 17.0 g/dL Final   HCT 97/94/7974 49.2  39.0 - 52.0 % Final   MCV 09/25/2023 88.0  80.0 - 100.0 fL Final   MCH 09/25/2023 28.6  26.0 - 34.0 pg Final   MCHC 09/25/2023 32.5  30.0 - 36.0 g/dL Final   RDW 97/94/7974 13.9  11.5 - 15.5 % Final   Platelets 09/25/2023 231  150 - 400 K/uL Final   nRBC 09/25/2023 0.0  0.0 - 0.2 % Final   Neutrophils Relative % 09/25/2023 54  % Final   Neutro Abs 09/25/2023 5.1  1.7 - 7.7 K/uL Final   Lymphocytes Relative 09/25/2023 33  % Final   Lymphs Abs 09/25/2023 3.2  0.7 - 4.0 K/uL Final   Monocytes Relative 09/25/2023 6  % Final   Monocytes Absolute 09/25/2023 0.6  0.1 - 1.0 K/uL Final   Eosinophils Relative 09/25/2023 5  % Final   Eosinophils Absolute 09/25/2023 0.5  0.0 - 0.5 K/uL Final   Basophils Relative 09/25/2023 1  % Final   Basophils Absolute 09/25/2023 0.1  0.0 - 0.1 K/uL Final   Immature Granulocytes 09/25/2023 1  % Final   Abs Immature Granulocytes 09/25/2023 0.06  0.00 - 0.07 K/uL Final   Performed at [facility], [address], [place], [place] [address]   Sodium 09/25/2023 139  135 - 145 mmol/L Final   Potassium 09/25/2023 4.1  3.5 - 5.1 mmol/L Final   Chloride 09/25/2023 100  98 - 111 mmol/L Final   CO2 09/25/2023 29  22 - 32 mmol/L Final   Glucose, Bld 09/25/2023 92  70 - 99 mg/dL Final   Glucose reference range applies only to samples taken after fasting for at least 8 hours.   BUN 09/25/2023 14  6 - 20 mg/dL Final   Creatinine, Ser 09/25/2023 1.08  0.61 - 1.24 mg/dL Final   Calcium 97/94/7974 9.7  8.9 - 10.3 mg/dL Final   Total Protein 97/94/7974 7.4  6.5 -  8.1 g/dL Final   Albumin 97/94/7974 4.4  3.5 - 5.0 g/dL Final   AST 97/94/7974 25  15 - 41 U/L Final   ALT 09/25/2023 26  0 - 44 U/L Final   Alkaline Phosphatase 09/25/2023 77  38 - 126 U/L Final   Total Bilirubin 09/25/2023 0.6  0.0 - 1.2 mg/dL Final   GFR, Estimated 09/25/2023 >60  >60 mL/min Final   Comment: (NOTE) Calculated using the CKD-EPI Creatinine Equation (2021)    Anion gap 09/25/2023 10  5 - 15 Final   Performed at [facility], [address] [place], [place] [address]   Alcohol, Ethyl (B) 09/25/2023 <10  <10 mg/dL Final   Comment: (NOTE) Lowest detectable limit for serum alcohol is 10 mg/dL.  For medical purposes only. Performed at [facility], [address] [place], [place] [address]    TSH 09/25/2023 5.681 (H)  0.350 - 4.500 uIU/mL Final   Comment: Performed by a 3rd Generation assay with a functional sensitivity of <=0.01 uIU/mL. Performed at [facility], [address], [place], [place] [address]    POC Marijuana UR 09/25/2023 Positive (A)   Final   POC Oxycodone UR 09/25/2023 None Detected   Final   POC Methadone UR 09/25/2023 None Detected   Final   POC Morphine 09/25/2023 None Detected   Final   POC Methamphetamine UR 09/25/2023 None Detected   Final   POC Cocaine UR 09/25/2023 None Detected   Final   POC Oxazepam (BZO) 09/25/2023 None Detected   Final   POC Buprenorphine (BUP) 09/25/2023 None  Detected   Final   POC Secobarbital (BAR) 09/25/2023 None Detected   Final   POC Amphetamine UR 09/25/2023 None Detected   Final  Office Visit on 07/15/2023  Component Date Value Ref Range Status   Hemoglobin A1C 07/15/2023 5.7 (A)  4.0 - 5.6 % Final    Blood Alcohol level:  Lab Results  Component Value Date   ETH <10 09/25/2023   ETH <10 08/14/2021    Metabolic Disorder Labs: Lab Results  Component Value Date   HGBA1C 5.7 (A) 07/15/2023   MPG 120 08/13/2022   MPG 114.02 11/14/2019   Lab Results  Component Value Date   PROLACTIN 64.5 (H) 03/26/2023   PROLACTIN 10.6 12/16/2020   Lab Results  Component Value Date   CHOL 122 08/13/2022   TRIG 44 08/13/2022   HDL 52 08/13/2022   CHOLHDL 2.3 08/13/2022   VLDL 9 08/13/2022   LDLCALC 61 08/13/2022   LDLCALC 69 10/30/2021    Therapeutic Lab Levels: No results found for: LITHIUM No results found for: VALPROATE Lab Results  Component Value Date   CBMZ 8.1 11/14/2019    Physical Findings   AIMS    Flowsheet Row Office Visit from 05/23/2023 in [facility] Office Visit from 12/25/2022 in [facility]) [facility]) [facility]  AIMS Total Score 0 0 0 0 0      AUDIT    Flowsheet Row Admission (Discharged) from [facility] (Discharged) [facility] (Discharged) from [facility] Ad[facility] (Discharged) [facility]  Alcohol Use Disorder Identification Test Final Score (AUDIT) 0 0 0 3      GAD-7    Flowsheet  Row Counselor from 09/09/2023 in [facility] Office Visit from 05/23/2023 in [facility] Clinical Support from 02/28/2023 in [facility] Office Visit from 12/25/2022 in [facility] Clinical Support from 11/28/2022 in [facility]  Total GAD-7 Score 12 0 9 8 3       PHQ2-9    Flowsheet Row ED from 09/25/2023 in [facility] Counselor from 09/09/2023 in [facility] Nutrition from 06/28/2023 in [facility]  - [facility] [place] Office Visit from 05/23/2023 in [facility] Clinical Support from 02/28/2023 in [facility]  PHQ-2 Total Score 3 5 2 2 3   PHQ-9 Total Score 8 13 6 3 7       Flowsheet Row ED from 09/25/2023 in [facility] Most recent reading at 09/26/2023 12:14 AM ED from 09/25/2023 in [facility] Most recent reading at 09/25/2023  5:14 PM Office Visit from 05/23/2023 in [facility] Most recent reading at 05/23/2023  9:20 AM  C-SSRS RISK CATEGORY No Risk No Risk Error: Q3, 4, or 5 should not be populated when Q2 is No        Musculoskeletal  Strength & Muscle Tone: within normal limits Gait & Station: normal Patient leans: N/A  Psychiatric Specialty Exam  Presentation  General Appearance:  Casual  Eye Contact: Good  Speech: Clear and Coherent  Speech Volume: Normal  Handedness: Right   Mood and Affect  Mood: Anxious; Depressed  Affect: Appropriate   Thought Process  Thought Processes: Coherent  Descriptions of Associations:Intact  Orientation:Full (Time, Place and Person)  Thought Content:Logical  Diagnosis of Schizophrenia or Schizoaffective disorder in past: Yes  Duration of Psychotic Symptoms: No  data recorded  Hallucinations:No data recorded  Ideas of Reference:None  Suicidal Thoughts:No data recorded  Homicidal Thoughts:No data recorded   Sensorium  Memory: Immediate Fair  Judgment: Fair  Insight: Fair   Art Therapist  Concentration: Good  Attention Span: Good  Recall: Good  Fund of Knowledge: Good  Language: Good   Psychomotor Activity  Psychomotor Activity: No data recorded   Assets  Assets: Desire for Improvement; Social Support   Sleep  Sleep: No data recorded   No data recorded   Physical Exam  Physical Exam Vitals and nursing note reviewed.  Constitutional:      Appearance: Normal appearance. He is normal weight.  Skin:    General: Skin is warm.  Neurological:     General: No focal deficit present.     Mental Status: He is alert and oriented to person, place, and time. Mental status is at baseline.  Psychiatric:        Mood and Affect: Mood normal.        Behavior: Behavior normal.        Thought Content: Thought content normal.        Judgment: Judgment normal.    Review of Systems  Psychiatric/Behavioral:  Positive for substance abuse (THC). The patient is not nervous/anxious.   All other systems reviewed and are negative.  Blood pressure (!) 139/92, pulse 65, temperature 97.9 F (36.6 C), temperature source Oral, resp. rate 18, SpO2 100%. There is  no height or weight on file to calculate BMI.  Treatment Plan Summary: Daily contact with patient to assess and evaluate symptoms and progress in treatment, Medication management, and Plan   -Continue current prn medications -Continue agitation medications at this time, hx of IED -Recommend ongoing group and supportive therapy and a referral to IOP at this time.  Anticpated discharge date 09/28/2023. Patient may want to stay longer.   Majel GORMAN Ramp, FNP 09/27/2023 10:17 AM

## 2023-09-28 DIAGNOSIS — F259 Schizoaffective disorder, unspecified: Secondary | ICD-10-CM | POA: Diagnosis not present

## 2023-09-28 DIAGNOSIS — F319 Bipolar disorder, unspecified: Secondary | ICD-10-CM | POA: Diagnosis not present

## 2023-09-28 DIAGNOSIS — Z560 Unemployment, unspecified: Secondary | ICD-10-CM | POA: Diagnosis not present

## 2023-09-28 DIAGNOSIS — F1210 Cannabis abuse, uncomplicated: Secondary | ICD-10-CM | POA: Diagnosis not present

## 2023-09-28 MED ORDER — TRAZODONE HCL 50 MG PO TABS: 50.0000 mg | ORAL_TABLET | Freq: Every evening | ORAL | 0 refills | Status: DC | PRN

## 2023-09-28 NOTE — ED Notes (Signed)
 Patient is in the bedroom calm and sleeping. NAD. Respirations are even and unlabored. Will continue to monitor for safety.

## 2023-09-28 NOTE — Progress Notes (Signed)
 Meal given to pt.

## 2023-09-28 NOTE — ED Notes (Signed)

## 2023-09-28 NOTE — ED Notes (Signed)
Patient A&Ox4. Denies intent to harm self/others when asked. Denies A/VH. Patient denies any physical complaints when asked. No acute distress noted. Routine safety checks conducted according to facility protocol. Encouraged patient to notify staff if thoughts of harm toward self or others arise. Patient verbalize understanding and agreement. Will continue to monitor for safety.    

## 2023-10-02 ENCOUNTER — Ambulatory Visit (INDEPENDENT_AMBULATORY_CARE_PROVIDER_SITE_OTHER): Payer: MEDICAID | Admitting: Clinical

## 2023-10-02 DIAGNOSIS — F250 Schizoaffective disorder, bipolar type: Secondary | ICD-10-CM

## 2023-10-02 NOTE — Progress Notes (Signed)
   THERAPIST PROGRESS NOTE  Session Time: 40 minutes  Participation Level: Active  Behavioral Response: CasualAlertEuthymic  Type of Therapy: Individual Therapy  Treatment Goals addressed:  Lawrence [name] will practice behavioral activation skills 6 times per week for the next 12 weeks   ProgressTowards Goals: Progressing  Interventions: CBT  Summary:  Lawrence [name] is a 28 y.o. male who presents for the scheduled appointment oriented x 5, appropriately dressed, and friendly.  Client denied hallucinations and delusions.  Client presents with his grandmother for the appointment. Client reported on today he has been doing pretty well. Client reported however he is still not able to get rest at night but will eventually fall asleep during the daytime. Client's grandmother agreed that he has trouble with naturally being able to become tired. Client reported he went to the urgent care last week because he wanted time to detox from marijuana. Client reported he was able to sit and reflect on things. Client reported his depression has not been as bad. Client discussed his history of depression has always been natural to him and cannot tell when it is of major issue until he is not functioning. Client reported a good week is shown by him waking early, going to the gym some days and having hygiene practices every other day. Client reported he has told his mother and grandmother he has desires to eventually be off his medication. Client reported his mother said he can only live with her if he takes his medication. Client reported he is scared of schizophrenia but he gets tired of taking medications. Evidence of progress towards goal:  client is medication compliant 7 days per week. Client reported practicing self care/hygiene.  Suicidal/Homicidal: Nowithout intent/plan  Therapist Response:  Therapist began the appointment asking the client how he has been doing since last seen. Therapist used cbt to  engage with active listening and positive emotional support. Therapist used cbt to engage and ask the client about recent inpatient tx and change of mood/behaviors since discharge. Therapist used cbt to engage and positively reinforce his mindfulness of health and unhealthy behaviors. Therapist used cbt to engage with the client and grandmother about importance of medication compliance and the pros and cons of discontinuing. Therapist used CBT ask the client to identify his progress with frequency of use with coping skills with continued practice in his daily activity.  Therapist assigned the client homework to practice self care.     Plan: Return again in 4 weeks.  Diagnosis: schizoaffective disorder, bipolar type  Collaboration of Care: Patient refused AEB none requested by the client.  Patient/Guardian was advised Release of Information must be obtained prior to any record release in order to collaborate their care with an outside provider. Patient/Guardian was advised if they have not already done so to contact the registration department to sign all necessary forms in order for [place] to release information regarding their care.   Consent: Patient/Guardian gives verbal consent for treatment and assignment of benefits for services provided during this visit. Patient/Guardian expressed understanding and agreed to proceed.   Lawrence Rhymes [name], LCSW 10/02/2023

## 2023-10-06 NOTE — Progress Notes (Signed)
 Comprehensive Clinical Assessment (CCA) Note  09/09/2023 Lawrence [name] [id number]  Chief Complaint:  Chief Complaint  Patient presents with   Depression   Anxiety   Visit Diagnosis:   Schizoaffective disorder, bipolar type Cannabis use disorder, moderate  Interpretive summary:  Client is a 28 year old male presenting to the [facility] to establish with outpatient counselor.  Client is currently a patient at [facility] psychiatry for the treatment of schizoaffective disorder bipolar type.  Client presented with his grandmother for the appointment.  Client reported he last had an outpatient counselor approximately a year ago.  Client reported regarding his overall mental health there are things that he cannot and cannot handle on his own.  Client reported he has always had depression which turned into schizoaffective disorder.  Client reported he does not want to take medication for anything.  Client reported his current depressive symptoms include lack of motivation, insomnia, pacing the floor.  Client reported he has no issues with self harming behaviors.  Client reported no auditory and/or visual hallucinations at this time.  Client reported he does have anxiety exhibited by situational panic attacks and some worrying.  Client's grandmother reported he does not go out but maybe to go eat with his mom for her to go to the store for a few minutes.  Client reported his appetite has changed from 2 meals to now eating about 3-4 meals per day.  Client reported the last time he did experience AVH was in December 2024 evidenced by his eyes following a certain pattern and down that was not visible to other people.  Client reported current use of marijuana which has been reoccurring since high school.  Client reported he thinks the marijuana use beginning " kick started" schizophrenia symptoms.  Client reported in high school he was always talking as if someone was.  In his mind  with near someone else.  Client reported family history of mental health disorders including his mother who is diagnosed with depression and OCD. Client presented oriented x 5, appropriately dressed, and cooperative.  Client denied hallucinations, delusions, suicidal and homicidal ideations.  Treatment recommendations: Individual counseling and continued follow-up with psychiatry for medication management    CCA Biopsychosocial Intake/Chief Complaint:  Client presents as an active patient of [facility] psychiatry for the treatment of schizoaffective disorder bipolar type.  Client reported he is not interested in having outpatient counselor.  Current Symptoms/Problems: Client reported mood swings, depressed mood, insomnia, lack of motivation, situational panic attacks  Patient Reported Schizophrenia/Schizoaffective Diagnosis in Past: Yes  Strengths: Voluntarily engaging in outpatient services  Preferences: Counseling and medication management  Abilities: Vocalize problems and needs to help resolve some of her stressors  Type of Services Patient Feels are Needed: Psychiatry and individual therapy  Initial Clinical Notes/Concerns: No data recorded  Mental Health Symptoms Depression:  Change in energy/activity   Duration of Depressive symptoms: Greater than two weeks   Mania:  None   Anxiety:   Difficulty concentrating; Tension   Psychosis:  None   Duration of Psychotic symptoms: No data recorded  Trauma:  None   Obsessions:  None   Compulsions:  None   Inattention:  None   Hyperactivity/Impulsivity:  None   Oppositional/Defiant Behaviors:  None   Emotional Irregularity:  None   Other Mood/Personality Symptoms:  Patient does admitt to above symptoms although feels they are unrelated to mental health and "just life"    Mental Status Exam Appearance and self-care  Stature:  Average   Weight:  Average weight   Clothing:  Casual   Grooming:  Normal   Cosmetic use:   Age appropriate   Posture/gait:  Normal   Motor activity:  Not Remarkable   Sensorium  Attention:  Normal   Concentration:  Normal   Orientation:  X5   Recall/memory:  Normal   Affect and Mood  Affect:  Congruent   Mood:  Anxious   Relating  Eye contact:  Normal   Facial expression:  Responsive   Attitude toward examiner:  Cooperative   Thought and Language  Speech flow: Clear and Coherent   Thought content:  Appropriate to Mood and Circumstances   Preoccupation:  None   Hallucinations:  None   Organization:  No data recorded  [employer] of Knowledge:  Good   Intelligence:  Average   Abstraction:  Normal   Judgement:  Good   Reality Testing:  Adequate   Insight:  Good   Decision Making:  Normal   Social Functioning  Social Maturity:  Responsible   Social Judgement:  Normal   Stress  Stressors:  Transitions   Coping Ability:  Normal   Skill Deficits:  Activities of daily living   Supports:  Family     Religion: Religion/Spirituality Are You A Religious Person?: No  Leisure/Recreation: Leisure / Recreation Do You Have Hobbies?: No  Exercise/Diet: Exercise/Diet Do You Exercise?: No Have You Gained or Lost A Significant Amount of Weight in the Past Six Months?: No Do You Follow a Special Diet?: No Do You Have Any Trouble Sleeping?: Yes   CCA Employment/Education Employment/Work Situation: Employment / Work Situation Employment Situation: Unemployed  Education: Education Is Patient Currently Attending School?: No Did Garment/textile technologist From [employer]?: Yes   CCA Family/Childhood History Family and Relationship History: Family history Marital status: Single Does patient have children?: No  Childhood History:  Childhood History By whom was/is the patient raised?: Mother, Grandparents Does patient have siblings?: No Did patient suffer any verbal/emotional/physical/sexual abuse as a child?: No Did patient  suffer from severe childhood neglect?: No Has patient ever been sexually abused/assaulted/raped as an adolescent or adult?: No Was the patient ever a victim of a crime or a disaster?: No Witnessed domestic violence?: No Has patient been affected by domestic violence as an adult?: No  Child/Adolescent Assessment:     CCA Substance Use Alcohol/Drug Use: Alcohol / Drug Use History of alcohol / drug use?: Yes Substance #1 Name of Substance 1: THC 1 - Age of First Use: 28 years old 1 - Amount (size/oz): "I smoke all day, every day" 1 - Frequency: daiy 1 - Duration: on-going 1 - Last Use / Amount: 09/08/2023 1 - Method of Aquiring: illegally 1- Route of Use: smoking                       ASAM's:  Six Dimensions of Multidimensional Assessment  Dimension 1:  Acute Intoxication and/or Withdrawal Potential:   Dimension 1:  Description of individual's past and current experiences of substance use and withdrawal: Client reported no history of withdrawal symptoms and/or substance use treatment  Dimension 2:  Biomedical Conditions and Complications:   Dimension 2:  Description of patient's biomedical conditions and  complications: Client reported no medical conditions.  Dimension 3:  Emotional, Behavioral, or Cognitive Conditions and Complications:  Dimension 3:  Description of emotional, behavioral, or cognitive conditions and complications: Client reported a history of being diagnosed with schizoaffective  disorder without suicidal ideations.  Dimension 4:  Readiness to Change:  Dimension 4:  Description of Readiness to Change criteria: Client is in the precontemplation stage of change  Dimension 5:  Relapse, Continued use, or Continued Problem Potential:  Dimension 5:  Relapse, continued use, or continued problem potential critiera description: Client reports current daily use  Dimension 6:  Recovery/Living Environment:  Dimension 6:  Recovery/Iiving environment criteria description:  Client reported he has positive support from his family.  ASAM Severity Score: ASAM's Severity Rating Score: 3  ASAM Recommended Level of Treatment: ASAM Recommended Level of Treatment: Level I Outpatient Treatment   Substance use Disorder (SUD) Substance Use Disorder (SUD)  Checklist Symptoms of Substance Use: Continued use despite having a persistent/recurrent physical/psychological problem caused/exacerbated by use, Presence of craving or strong urge to use  Recommendations for Services/Supports/Treatments: Recommendations for Services/Supports/Treatments Recommendations For Services/Supports/Treatments: Individual Therapy, Medication Management  DSM5 Diagnoses: Patient Active Problem List   Diagnosis Date Noted   Substance abuse (HCC) 09/25/2023   Use of cannabis 09/12/2023   Prediabetes 04/12/2023   Well adult exam 02/28/2023   Schizophrenia (HCC) 11/11/2019   Schizoaffective disorder, bipolar type (HCC) 01/09/2017   Intermittent explosive disorder 05/13/2016   Intoxication by drug (HCC) 07/12/2014   Intoxication 07/12/2014   Depression 07/12/2014   Overdose of antitussive     Patient Centered Plan: Patient is on the following Treatment Plan(s):  Depression   Referrals to Alternative Service(s): Referred to Alternative Service(s):   Place:   Date:   Time:    Referred to Alternative Service(s):   Place:   Date:   Time:    Referred to Alternative Service(s):   Place:   Date:   Time:    Referred to Alternative Service(s):   Place:   Date:   Time:      Collaboration of Care: Referral or follow-up with counselor/therapist AEB [facility] [place]  Patient/Guardian was advised Release of Information must be obtained prior to any record release in order to collaborate their care with an outside provider. Patient/Guardian was advised if they have not already done so to contact the registration department to sign all necessary forms in order for [place] to release information regarding their care.    Consent: Patient/Guardian gives verbal consent for treatment and assignment of benefits for services provided during this visit. Patient/Guardian expressed understanding and agreed to proceed.   Neena Rhymes Kathalina Ostermann, LCSW

## 2023-10-10 ENCOUNTER — Ambulatory Visit (INDEPENDENT_AMBULATORY_CARE_PROVIDER_SITE_OTHER): Payer: MEDICAID

## 2023-10-10 VITALS — BP 130/76 | HR 79 | Temp 98.0°F | Ht 73.0 in | Wt 243.0 lb

## 2023-10-10 DIAGNOSIS — F250 Schizoaffective disorder, bipolar type: Secondary | ICD-10-CM

## 2023-10-10 MED ORDER — RISPERIDONE ER 100 MG/0.28ML ~~LOC~~ SUSY
100.0000 mg | PREFILLED_SYRINGE | Freq: Once | SUBCUTANEOUS | Status: AC
  Administered 2023-10-10: 100 mg via SUBCUTANEOUS

## 2023-10-10 NOTE — Progress Notes (Cosign Needed Addendum)
 Pt presents today for injection of UZEDY  100mg /  0.28 Mml. Pt tolerated injection well with no complaints. Pt denies any AVH, SI, and HI. This was administrated in Pt's left deltoid as prepared. Pt states that he is in good spirits and denies any pain or discomfort . Pt states that he has no questions for Provider at the moment.

## 2023-10-22 ENCOUNTER — Ambulatory Visit (HOSPITAL_COMMUNITY): Payer: MEDICAID | Admitting: Clinical

## 2023-11-07 ENCOUNTER — Ambulatory Visit (INDEPENDENT_AMBULATORY_CARE_PROVIDER_SITE_OTHER): Payer: MEDICAID

## 2023-11-07 ENCOUNTER — Other Ambulatory Visit (HOSPITAL_COMMUNITY): Payer: Self-pay | Admitting: Psychiatry

## 2023-11-07 ENCOUNTER — Encounter (HOSPITAL_COMMUNITY): Payer: Self-pay

## 2023-11-07 VITALS — BP 122/69 | HR 70 | Wt 247.0 lb

## 2023-11-07 DIAGNOSIS — F411 Generalized anxiety disorder: Secondary | ICD-10-CM | POA: Diagnosis not present

## 2023-11-07 DIAGNOSIS — G4700 Insomnia, unspecified: Secondary | ICD-10-CM

## 2023-11-07 DIAGNOSIS — F200 Paranoid schizophrenia: Secondary | ICD-10-CM

## 2023-11-07 MED ORDER — RISPERIDONE ER 100 MG/0.28ML ~~LOC~~ SUSY
100.0000 mg | PREFILLED_SYRINGE | SUBCUTANEOUS | Status: DC
  Administered 2023-12-05: 100 mg via SUBCUTANEOUS

## 2023-11-07 MED ORDER — RISPERIDONE ER 50 MG/0.14ML ~~LOC~~ SUSY: 50.0000 mg | PREFILLED_SYRINGE | Freq: Once | SUBCUTANEOUS | Status: DC

## 2023-12-02 ENCOUNTER — Ambulatory Visit (INDEPENDENT_AMBULATORY_CARE_PROVIDER_SITE_OTHER): Payer: MEDICAID | Admitting: Clinical

## 2023-12-02 DIAGNOSIS — F250 Schizoaffective disorder, bipolar type: Secondary | ICD-10-CM

## 2023-12-02 NOTE — Progress Notes (Signed)
   THERAPIST PROGRESS NOTE  Session Time: 30 minutes  Participation Level: Active  Behavioral Response: CasualAlertEuthymic  Type of Therapy: Individual Therapy  Treatment Goals addressed: Reduce frequency, intensity, and duration of depression symptoms so that daily functioning is improved as self report 1x per session   ProgressTowards Goals: Progressing  Interventions: CBT  Summary:  Lawrence [name] is a 28 y.o. male who presents for the scheduled appointment oriented x 4 appropriately dressed, and friendly.  Client denied hallucinations and delusions. Client presented with his grandmother for the appointment.  Client reported on today he is feeling fairly well.  Client reported since he was last seen things have been going well.  Client's grandmother agreed that overall he is doing a lot better than how he was sometime ago and they are happy about it.  Client reported he is still having trouble with sleeping during the night.  Client's grandmother reported that he was prescribed a medication to help him go to sleep but she has not picked it up from the pharmacy.  Client reported he does not like taking medications as is and does not care to begin a new medication to help him with sleep.  Client reported he has been working on himself in learning how to give himself love as well as his mother and grandmother. Client reported he has also been reflecting over other things that happened in life which he is working through but does not want to discuss right now. Client reported he and his grandmother just got back from a cruise and had a good time. Evidence of progress towards goal: Client reported 1 positive of being able to regulate his emotions and figuring out ways to process his thoughts about past events appropriately.  Client reported he has been doing his self hygiene activities at least 5 days out of 7.  Suicidal/Homicidal: Nowithout intent/plan  Therapist Response:  Therapist began  the appointment asking the client how he has been doing since last seen. Therapist engaged with our listening and positive emotional support. Therapist used CBT to ask the client how he has been doing with medication compliance compared to ongoing psychiatric symptoms. Therapist used cbt to teach the client about techniques to manage his thoughts and regulate his emotions. Therapist used CBT ask the client to identify her progress with frequency of use with coping skills with continued practice in her daily activity.    Therapist assigned the client homework to practice journaling his thoughts.    Plan: Return again in 4 weeks.  Diagnosis: schizoaffective disorder, bipolar type  Collaboration of Care: Patient refused AEB none requested by the client.  Patient/Guardian was advised Release of Information must be obtained prior to any record release in order to collaborate their care with an outside provider. Patient/Guardian was advised if they have not already done so to contact the registration department to sign all necessary forms in order for us  to release information regarding their care.   Consent: Patient/Guardian gives verbal consent for treatment and assignment of benefits for services provided during this visit. Patient/Guardian expressed understanding and agreed to proceed.   Cathlyn Tersigni Y Cyril Railey, LCSW 12/02/2023

## 2023-12-05 ENCOUNTER — Ambulatory Visit (INDEPENDENT_AMBULATORY_CARE_PROVIDER_SITE_OTHER): Payer: MEDICAID | Admitting: Psychiatry

## 2023-12-05 ENCOUNTER — Other Ambulatory Visit (HOSPITAL_BASED_OUTPATIENT_CLINIC_OR_DEPARTMENT_OTHER): Payer: Self-pay

## 2023-12-05 ENCOUNTER — Encounter (HOSPITAL_COMMUNITY): Payer: Self-pay

## 2023-12-05 ENCOUNTER — Ambulatory Visit (INDEPENDENT_AMBULATORY_CARE_PROVIDER_SITE_OTHER)

## 2023-12-05 ENCOUNTER — Telehealth: Payer: MEDICAID | Admitting: Physician Assistant

## 2023-12-05 VITALS — BP 116/75 | HR 80 | Ht 72.0 in | Wt 250.0 lb

## 2023-12-05 DIAGNOSIS — F250 Schizoaffective disorder, bipolar type: Secondary | ICD-10-CM | POA: Diagnosis not present

## 2023-12-05 DIAGNOSIS — J019 Acute sinusitis, unspecified: Secondary | ICD-10-CM | POA: Diagnosis not present

## 2023-12-05 DIAGNOSIS — B9689 Other specified bacterial agents as the cause of diseases classified elsewhere: Secondary | ICD-10-CM

## 2023-12-05 DIAGNOSIS — F411 Generalized anxiety disorder: Secondary | ICD-10-CM

## 2023-12-05 MED ORDER — TRAZODONE HCL 100 MG PO TABS
100.0000 mg | ORAL_TABLET | Freq: Every evening | ORAL | 3 refills | Status: DC | PRN
  Filled 2023-12-05: qty 30, 30d supply, fill #0

## 2023-12-05 MED ORDER — UZEDY 100 MG/0.28ML ~~LOC~~ SUSY: 100.0000 mg | PREFILLED_SYRINGE | SUBCUTANEOUS | 11 refills | Status: DC

## 2023-12-05 MED ORDER — HYDROXYZINE HCL 10 MG PO TABS
10.0000 mg | ORAL_TABLET | Freq: Three times a day (TID) | ORAL | 3 refills | Status: DC | PRN
  Filled 2023-12-05: qty 90, 30d supply, fill #0

## 2023-12-05 MED ORDER — UZEDY 125 MG/0.35ML ~~LOC~~ SUSY: 125.0000 mg | PREFILLED_SYRINGE | SUBCUTANEOUS | 11 refills | Status: DC

## 2023-12-05 MED ORDER — RISPERIDONE ER 125 MG/0.35ML ~~LOC~~ SUSY
125.0000 mg | PREFILLED_SYRINGE | SUBCUTANEOUS | Status: AC
Start: 2023-12-05 — End: ?
  Administered 2024-01-02 – 2024-09-23 (×7): 125 mg via SUBCUTANEOUS

## 2023-12-05 MED ORDER — AMOXICILLIN-POT CLAVULANATE 875-125 MG PO TABS
1.0000 | ORAL_TABLET | Freq: Two times a day (BID) | ORAL | 0 refills | Status: DC
  Filled 2023-12-05: qty 14, 7d supply, fill #0

## 2023-12-05 NOTE — Progress Notes (Signed)
 BH MD/PA/NP OP Progress Note  12/05/2023 12:19 PM Lawrence [name]  MRN:  [id number]  Chief Complaint: "I feel like I am a God" Per Grandmother "He talks a lot at time"  HPI: 28 year old male seen today for follow-up psychiatric evaluation.  He has a psychiatric history of depression, intermittent explosive disorder, schizoaffective disorder bipolar type, and schizophrenia.  Currently he is managed on Uzedy 100 mg monthly and Trazodone 50 mg nightly as needed.  He informed Clinical research associate that his medication are somewhat effective in managing his psychiatric conditions.   Today patient is well groomed, pleasant, cooperative and engaged in conversation. He informed Clinical research associate that he has been experiencing mania. He notes that he feels that he is a God. Patient also presents with religious delusions. He notes that believes he has healing power/energy that will heal others bodies and himself. He reports having spiritual knowledge. Patient also is distracted, has racing thoughts, fluctuations in mood, and poor sleep. He notes that he sleeps 3-4 hours in the day and 1 hour nightly. Patient was seen with his grandmother who reports that he talks excessively.   Recently he notes that his anxiety and depression has increased. Today provider conducted GAD-7 and patient scored a 5, at his last visit he scored a 0.  Provider also conducted PHQ-9 of patient 13, at last visit he scored a 3.  Today he denies SI/HI/AVH.     Patient endorses increased having an appetite. His grandmother notes that she continues to be concerned about his weight.  He notes that prior to starting Uzedy he was 180 pounds.  Patient now 250 pounds. On 05/23/2023 her was 231, 04/12/2023 he was 210 and on 04/24/2022 her was 218. Patient notes that he did speak with a nutritionist but did not find it effective. He also notes that he saw his PCP who recommended diet and exercise. Patient notes that recently he has been drinking lots of water and sodas. He also  notes that he has been eating more cookie. Provider reccommended more fruits, vegetables, and lean protein. He does note that he is exercising. Provider discussed metabolic symptom with patient. At his last visit provider discussed starting Wellbutrin or metformin combat weight. Provider recommended that Wellbutrin not be started as it can worsen mania. At this time he is not interested in Metformin.    Today provider conducted taking process with patient scored to 0.  Patient sneezing today and had watery eyes. He notes that his allergies are bad and is currently not taking anything to assist.   Today Uzedy 100 mg increased to 125 mg to help manage mania. Trazodone 50 mg increased to 100 mg to help managed sleep. Patient will continue diet and exercise as he is not interested in starting metformin to help manage weight. Patient also agreeable to starting hydroxyzine 10 mg three times daily as needed to help manage anxiety and allergies. Potential side effects of medication and risks vs benefits of treatment vs non-treatment were explained and discussed. All questions were answered.No other concerns noted at this time.  Visit Diagnosis:    ICD-10-CM   1. Generalized anxiety disorder  F41.1 traZODone (DESYREL) 100 MG tablet    hydrOXYzine (ATARAX) 10 MG tablet    2. Schizoaffective disorder, bipolar type (HCC)  F25.0 hydrOXYzine (ATARAX) 10 MG tablet    risperiDONE ER (UZEDY) SUSY 125 mg    risperiDONE ER (UZEDY) 125 MG/0.35ML SUSY    DISCONTINUED: risperiDONE ER (UZEDY) 100 MG/0.28ML SUSY  Past Psychiatric History: depression, intermittent explosive disorder, schizoaffective disorder bipolar type, and schizophrenia  Past Medical History:  Past Medical History:  Diagnosis Date   ADD (attention deficit disorder)    Asthma    Depressed    Schizoaffective disorder (HCC)    No past surgical history on file.  Family Psychiatric History: Patient denies family history of psychiatric  illness; however, patient's grandmother states that the patient's mother saw a psychologist during her 12th grade year of high school.  Patient denies any family members being on medications at this time.   Family History: No family history on file.  Social History:  Social History   Socioeconomic History   Marital status: Single    Spouse name: Not on file   Number of children: Not on file   Years of education: Not on file   Highest education level: Not on file  Occupational History   Occupation: Unemployed  Tobacco Use   Smoking status: Former    Current packs/day: 0.00    Types: Cigarettes    Quit date: 04/11/2023    Years since quitting: 0.6   Smokeless tobacco: Never  Vaping Use   Vaping status: Never Used  Substance and Sexual Activity   Alcohol use: Not Currently   Drug use: Not Currently    Frequency: 2.0 times per week    Types: Marijuana   Sexual activity: Never  Other Topics Concern   Not on file  Social History Narrative   Pt is unemployed; lives with grandmother   Social Drivers of Corporate investment banker Strain: Not on File (12/07/2021)   Received from [facility] [employer]    Financial Resource Strain: 0  Food Insecurity: Food Insecurity Present (09/26/2023)   Hunger Vital Sign    Worried About Running Out of Food in the Last Year: Sometimes true    Ran Out of Food in the Last Year: Sometimes true  Transportation Needs: No Transportation Needs (09/26/2023)   PRAPARE - Administrator, Civil Service (Medical): No    Lack of Transportation (Non-Medical): No  Physical Activity: Not on File (12/07/2021)   Received from [place], [place]   Physical Activity    Physical Activity: 0  Stress: Not on File (12/07/2021)   Received from [facility], [place]   Stress    Stress: 0  Social Connections: Not on File (05/04/2023)   Received from [facility]   Social Connections    Connectedness: 0    Allergies:  Allergies  Allergen Reactions   Bee  Venom Anaphylaxis   Apple Juice Other (See Comments)    Gums itch or hurt   Corn-Containing Products Cough   Lactose Other (See Comments)    GI upset   Milk (Cow) Itching and Other (See Comments)   Other Rash and Other (See Comments)    Green Beans: Asthma   Peanut Butter Flavoring Agent (Non-Screening) Cough    Metabolic Disorder Labs: Lab Results  Component Value Date   HGBA1C 5.7 (A) 07/15/2023   MPG 120 08/13/2022   MPG 114.02 11/14/2019   Lab Results  Component Value Date   PROLACTIN 64.5 (H) 03/26/2023   PROLACTIN 10.6 12/16/2020   Lab Results  Component Value Date   CHOL 122 08/13/2022   TRIG 44 08/13/2022   HDL 52 08/13/2022   CHOLHDL 2.3 08/13/2022   VLDL 9 08/13/2022   LDLCALC 61 08/13/2022   LDLCALC 69 10/30/2021   Lab Results  Component Value Date  TSH 5.681 (H) 09/25/2023   TSH 1.960 03/26/2023    Therapeutic Level Labs: No results found for: "LITHIUM" No results found for: "VALPROATE" Lab Results  Component Value Date   CBMZ 8.1 11/14/2019    Current Medications: Current Outpatient Medications  Medication Sig Dispense Refill   hydrOXYzine (ATARAX) 10 MG tablet Take 1 tablet (10 mg total) by mouth 3 (three) times daily as needed. 90 tablet 3   risperiDONE ER (UZEDY) 125 MG/0.35ML SUSY Inject 0.35 mLs (125 mg total) into the skin every 28 (twenty-eight) days. 0.35 mL 11   traZODone (DESYREL) 100 MG tablet Take 1 tablet (100 mg total) by mouth at bedtime as needed for sleep. 30 tablet 3   Current Facility-Administered Medications  Medication Dose Route Frequency Provider Last Rate Last Admin   risperiDONE ER (UZEDY) SUSY 125 mg  125 mg Subcutaneous Q28 days          Musculoskeletal: Strength & Muscle Tone: within normal limits Gait & Station: normal Patient leans: N/A  Psychiatric Specialty Exam: Review of Systems  There were no vitals taken for this visit.There is no height or weight on file to calculate BMI.  General Appearance: Well  Groomed  Eye Contact:  Good  Speech:  Clear and Coherent but rapid  Volume:  Normal  Mood:  Depressed  Affect:  Appropriate and Congruent  Thought Process:  Coherent, Goal Directed, and Linear  Orientation:  Full (Time, Place, and Person)  Thought Content: Logical and Delusions   Suicidal Thoughts:  No  Homicidal Thoughts:  No  Memory:  Immediate;   Good Recent;   Good Remote;   Good  Judgement:  Good  Insight:  Good  Psychomotor Activity:  Normal  Concentration:  Concentration: Good and Attention Span: Good  Recall:  Good  Fund of Knowledge: Good  Language: Good  Akathisia:  No  Handed:  Right  AIMS (if indicated): done, 0  Assets:  Communication Skills Desire for Improvement Financial Resources/Insurance Housing Leisure Time Physical Health Social Support Transportation  ADL's:  Intact  Cognition: WNL  Sleep:  Poor   Screenings: Geneticist, molecular Office Visit from 12/05/2023 in [facility] Office Visit from 05/23/2023 in [facility] Office Visit from 12/25/2022 in [facility]) [facility]) [facility]  AIMS Total Score 0 0 0 0 0      AUDIT    Flowsheet Row Admission (Discharged) from [facility] (Dis[facility]) [facility] Ad[facility] (Discharged) [facility] (Dis[facility]) [facility]  Alcohol Use Disorder Identification Test Final Score (AUDIT) 0 0 0 3      GAD-7    Flowsheet Row Office Visit from 12/05/2023 in [facility] Counselor from 09/09/2023 in [facility] Office Visit from 05/23/2023 in [facility] Clinical Support from 02/28/2023 in [facility] Office Visit from 12/25/2022 in [facility]  Total GAD-7 Score 5 12 0 9 8      PHQ2-9    Flowsheet Row Office Visit from 12/05/2023 in [facility] Most recent reading at 12/05/2023 11:58 AM ED from  09/25/2023 in [facility] Most recent reading at 09/28/2023  3:43 PM ED from 09/25/2023 in [facility] Most recent reading at 09/25/2023  9:10 PM Counselor from 09/09/2023 in [facility] Most recent reading at 09/09/2023  1:27 PM Nutrition from 06/28/2023 in [facility] Diab [facility]  - [facility] [place] Most recent reading at 06/28/2023  9:29 AM  PHQ-2 Total Score 3 0 3 5 2   PHQ-9 Total Score 13 0 8 13 6       Flowsheet Row Office Visit from 12/05/2023 in [facility] Most recent reading at 12/05/2023 11:58 AM ED from 09/25/2023 in [facility] Most recent reading at 09/28/2023  3:43 PM ED from 09/25/2023 in [facility] Most recent reading at 09/25/2023  5:14 PM  C-SSRS RISK CATEGORY Error: Q7 should not be populated when Q6 is No Error: Question 6 not populated No Risk        Assessment and Plan: Patient notes that he has been experiencing mania, insomnia, and increased depression. He is also concerned about his weight. Today Uzedy 100 mg increased to 125 mg to help manage mania. Trazodone 50 mg increased to 100 mg to help managed sleep. Patient will continue diet and exercise as he is not interested in starting metformin to help manage weight. Patient also agreeable to starting hydroxyzine 10 mg three times daily as needed to help manage anxiety and allergies.   1. Schizoaffective disorder, bipolar type  (HCC)  Start- hydrOXYzine (ATARAX) 10 MG tablet; Take 1 tablet (10 mg total) by mouth 3 (three) times daily as needed.  Dispense: 90 tablet; Refill: 3 Increased- risperiDONE ER (UZEDY) SUSY 125 mg Increased- risperiDONE ER (UZEDY) 125 MG/0.35ML SUSY; Inject 0.35 mLs (125 mg total) into the skin every 28 (twenty-eight) days.  Dispense: 0.35 mL; Refill: 11  2. Generalized anxiety disorder (Primary)  Increased- traZODone (DESYREL) 100 MG tablet; Take 1 tablet (100 mg total) by mouth at bedtime as needed for sleep.  Dispense: 30 tablet; Refill: 3 Start- hydrOXYzine (ATARAX) 10 MG tablet; Take 1 tablet (10 mg total) by mouth 3 (three) times daily as needed.  Dispense: 90 tablet; Refill: 3       Collaboration of Care: Collaboration of Care: Other provider involved in patient's care AEB Shot clinic staff and PCP  Patient/Guardian was advised Release of Information must be obtained prior to any record release in order to collaborate their care with an outside provider. Patient/Guardian was advised if they have not already done so to contact the registration department to sign all necessary forms in order for us  to release information regarding their care.   Consent: Patient/Guardian gives verbal consent for treatment and assignment of benefits for services provided during this visit. Patient/Guardian expressed understanding and agreed to proceed.   Follow up in 2.5 months Follow up with shot clinic in a month Arlyne Bering, NP 12/05/2023, 12:19 PM

## 2023-12-05 NOTE — Progress Notes (Cosign Needed)
 Pt presents today for injection of Uzedy given in Left arm with no complains. Pt denies all AVH, SI, and HI. Pt states that he is doing good overall. Pt does have worries about his weight gain since starting Uzedy which was addressed by provider to PT. Pt will be returning in 28 days.

## 2023-12-05 NOTE — Progress Notes (Signed)
 I have spent 5 minutes in review of e-visit questionnaire, review and updating patient chart, medical decision making and response to patient.   Piedad Climes, PA-C

## 2023-12-05 NOTE — Progress Notes (Signed)

## 2024-01-02 ENCOUNTER — Ambulatory Visit (INDEPENDENT_AMBULATORY_CARE_PROVIDER_SITE_OTHER): Payer: MEDICAID

## 2024-01-02 VITALS — BP 123/70 | HR 78 | Ht 72.0 in | Wt 248.0 lb

## 2024-01-02 DIAGNOSIS — F250 Schizoaffective disorder, bipolar type: Secondary | ICD-10-CM

## 2024-01-02 NOTE — Progress Notes (Cosign Needed Addendum)
 Pt presents today for injection of Uzedy  125mg  given in right arm with no complains. Pt denies all AVH, SI, and HI. Pt states that he is doing good overall. Pt does have worries about his weight gain since starting Uzedy  which was addressed by provider to PT. Pt will be returning in 28 days.

## 2024-01-07 ENCOUNTER — Ambulatory Visit (INDEPENDENT_AMBULATORY_CARE_PROVIDER_SITE_OTHER): Payer: MEDICAID | Admitting: Clinical

## 2024-01-07 DIAGNOSIS — F250 Schizoaffective disorder, bipolar type: Secondary | ICD-10-CM | POA: Diagnosis not present

## 2024-01-07 NOTE — Progress Notes (Signed)
 THERAPIST PROGRESS NOTE Virtual Visit via Video Note  I connected with Lawrence [name] on 01/07/24 at  9:00 AM EDT by a video enabled telemedicine application and verified that I am speaking with the correct person using two identifiers.  Location: Patient: home Provider: office   I discussed the limitations of evaluation and management by telemedicine and the availability of in person appointments. The patient expressed understanding and agreed to proceed.   Follow Up Instructions: I discussed the assessment and treatment plan with the patient. The patient was provided an opportunity to ask questions and all were answered. The patient agreed with the plan and demonstrated an understanding of the instructions.   The patient was advised to call back or seek an in-person evaluation if the symptoms worsen or if the condition fails to improve as anticipated.   Session Time: 25 minutes  Participation Level: Active  Behavioral Response: CasualAlertEuthymic  Type of Therapy: Individual Therapy  Treatment Goals addressed:  Reduce frequency, intensity, and duration of depression symptoms so that daily functioning is improved as self report 1x per session   ProgressTowards Goals: Progressing  Interventions: CBT and Supportive  Summary:  Lawrence [name] is a 28 y.o. male who presents for the scheduled appointment oriented x 5, appropriately dressed, and friendly.  Client denied hallucinations and delusions. Clients grandmother was present for the appointment. Client reported on today he is feeling better.  Client reported his medication management appointment for his shot went well and he experiences no side effects.  Client reported he has continued to do a lot of reflection and has been thinking about his overall therapist.  Client reported 3 things that he is proud about is his self-love, doing being possible, and helping others. Client reported he has also been working on his  depression. Client reported he has been walking a lot more. Client reported he wants to be able to work through his usual feelings of depression. Client reported wanting more tips on how to combat that. Client reported his sleep is improving that he can fall in a deeper sleep but not for lin. Client reported he has also been mindfully eating. Client reported since he has stopped using marijuana he feels mentally stronger although he is hoping his motivation will progress as time goes on. Client reported today he has had thoughts of going to the gym but he has not made it to going yet. Evidence of progress towards goal:  client reported 1 positive of which is using behavioral activation to improve his depression.  Suicidal/Homicidal: Nowithout intent/plan  Therapist Response:  Therapist began the appointment asking the client how he has been doing. Therapist used cbt to engage with active listening and positive emotional support. Therapist used cbt to engage and ask the client about his response to medication management. Therapist used cbt to engage and ask the client about current severity of his depression and teach him skills to help manage it. Therapist used cbt to positively reinforce the clients work to improve his cognitions. Therapist used CBT ask the client to identify his progress with frequency of use with coping skills with continued practice in his daily activity.    Therapist assigned the client homework to practice the skills discussed.   Plan: Return again in 4 weeks.  Diagnosis: schizoaffective disorder, bipolar type  Collaboration of Care: Patient refused AEB none requested by the client.  Patient/Guardian was advised Release of Information must be obtained prior to any record release in order to  collaborate their care with an outside provider. Patient/Guardian was advised if they have not already done so to contact the registration department to sign all necessary forms in  order for us  to release information regarding their care.   Consent: Patient/Guardian gives verbal consent for treatment and assignment of benefits for services provided during this visit. Patient/Guardian expressed understanding and agreed to proceed.   Jumar Greenstreet Y Dempsey Ahonen, LCSW 01/07/2024

## 2024-01-14 ENCOUNTER — Ambulatory Visit (INDEPENDENT_AMBULATORY_CARE_PROVIDER_SITE_OTHER): Payer: MEDICAID | Admitting: Family Medicine

## 2024-01-14 ENCOUNTER — Encounter: Payer: Self-pay | Admitting: Family Medicine

## 2024-01-14 VITALS — BP 112/70 | HR 82 | Temp 97.9°F | Ht 72.0 in | Wt 255.4 lb

## 2024-01-14 DIAGNOSIS — Z13220 Encounter for screening for lipoid disorders: Secondary | ICD-10-CM | POA: Diagnosis not present

## 2024-01-14 DIAGNOSIS — Z0000 Encounter for general adult medical examination without abnormal findings: Secondary | ICD-10-CM | POA: Diagnosis not present

## 2024-01-14 DIAGNOSIS — Z5181 Encounter for therapeutic drug level monitoring: Secondary | ICD-10-CM

## 2024-01-14 DIAGNOSIS — R7989 Other specified abnormal findings of blood chemistry: Secondary | ICD-10-CM | POA: Diagnosis not present

## 2024-01-14 DIAGNOSIS — R7303 Prediabetes: Secondary | ICD-10-CM

## 2024-01-14 LAB — LIPID PANEL
Cholesterol: 157 mg/dL (ref 0–200)
HDL: 35.2 mg/dL — ABNORMAL LOW (ref >39.00)
LDL Cholesterol: 85 mg/dL (ref 0–99)
NonHDL: 121.87
Total CHOL/HDL Ratio: 4
Triglycerides: 183 mg/dL — ABNORMAL HIGH (ref 0.0–149.0)
VLDL: 36.6 mg/dL (ref 0.0–40.0)

## 2024-01-14 LAB — POCT GLYCOSYLATED HEMOGLOBIN (HGB A1C): Hemoglobin A1C: 5.8 % — AB (ref 4.0–5.6)

## 2024-01-14 NOTE — Patient Instructions (Signed)
 Pepcid AC -- 1 tablet up to twice a day as needed   Hydroxyzine  will help not only with sleep but also allergy symptoms

## 2024-01-14 NOTE — Progress Notes (Signed)
 Complete physical exam  Patient: Lawrence [name]   DOB: 1996-01-25   28 y.o. Male  MRN: [id number]  Subjective:     Chief Complaint  Patient presents with   Medical Management of Chronic Issues    Lawrence [name] is a 28 y.o. male who presents today for a complete physical exam. He reports consuming a general diet. The patient does not participate in regular exercise at present. He generally feels well. He reports sleeping poorly. States he is having random nighttime awakenings. States that it takes him about 1-2 hours to fall back asleep. States that he has been given medication to help him sleep but he hasn't tried taking  He does not have additional problems to discuss today.    Pt takes MVI daily, no other herbal supplements.   Pt had questions about allergies and heart burn. I counseled the patient on both of these topics.    Most recent fall risk assessment:    06/28/2023    9:29 AM  Fall Risk   Falls in the past year? 0  Number falls in past yr: 0  Injury with Fall? 0     Most recent depression screenings:    01/14/2024   10:25 AM 12/05/2023   11:58 AM  PHQ 2/9 Scores  PHQ - 2 Score 3   PHQ- 9 Score 6      Information is confidential and restricted. Go to Review Flowsheets to unlock data.    Vision:Not within last year  and no vision issues and Dental: No current dental problems and Receives regular dental care  Patient Active Problem List   Diagnosis Date Noted   Substance abuse (HCC) 09/25/2023   Use of cannabis 09/12/2023   Prediabetes 04/12/2023   Well adult exam 02/28/2023   Schizophrenia (HCC) 11/11/2019   Schizoaffective disorder, bipolar type (HCC) 01/09/2017   Intermittent explosive disorder 05/13/2016   Intoxication by drug (HCC) 07/12/2014   Intoxication 07/12/2014   Depression 07/12/2014   Overdose of antitussive        Patient Care Team: Aida House, MD as PCP - General (Family Medicine)   Outpatient Medications Prior to Visit   Medication Sig   risperiDONE  ER (UZEDY ) 125 MG/0.35ML SUSY Inject 0.35 mLs (125 mg total) into the skin every 28 (twenty-eight) days.   [DISCONTINUED] amoxicillin -clavulanate (AUGMENTIN ) 875-125 MG tablet Take 1 tablet by mouth 2 (two) times daily.   [DISCONTINUED] hydrOXYzine  (ATARAX ) 10 MG tablet Take 1 tablet (10 mg total) by mouth 3 (three) times daily as needed.   [DISCONTINUED] traZODone  (DESYREL ) 100 MG tablet Take 1 tablet (100 mg total) by mouth at bedtime as needed for sleep.   Facility-Administered Medications Prior to Visit  Medication Dose Route Frequency Provider   risperiDONE  ER (UZEDY ) SUSY 125 mg  125 mg Subcutaneous Q28 days     Review of Systems  HENT:  Negative for hearing loss.   Eyes:  Negative for blurred vision.  Respiratory:  Negative for shortness of breath.   Cardiovascular:  Negative for chest pain.  Gastrointestinal: Negative.   Genitourinary: Negative.   Musculoskeletal:  Negative for back pain.  Neurological:  Negative for headaches.  Psychiatric/Behavioral:  Negative for depression.        Objective:     BP 112/70   Pulse 82   Temp 97.9 F (36.6 C) (Oral)   Ht 6' (1.829 m)   Wt 255 lb 6.4 oz (115.8 kg)   BMI 34.64 kg/m    Physical  Exam Vitals reviewed.  Constitutional:      Appearance: Normal appearance. He is well-groomed and overweight.  HENT:     Right Ear: Tympanic membrane and ear canal normal.     Left Ear: Tympanic membrane and ear canal normal.     Mouth/Throat:     Mouth: Mucous membranes are moist.     Pharynx: No posterior oropharyngeal erythema.  Eyes:     Extraocular Movements: Extraocular movements intact.     Conjunctiva/sclera: Conjunctivae normal.  Neck:     Thyroid : No thyromegaly.  Cardiovascular:     Rate and Rhythm: Normal rate and regular rhythm.     Heart sounds: S1 normal and S2 normal. No murmur heard. Pulmonary:     Effort: Pulmonary effort is normal.     Breath sounds: Normal breath sounds and air  entry. No rales.  Abdominal:     General: Abdomen is flat. Bowel sounds are normal.  Musculoskeletal:     Right lower leg: No edema.     Left lower leg: No edema.  Lymphadenopathy:     Cervical: No cervical adenopathy.  Neurological:     General: No focal deficit present.     Mental Status: He is alert and oriented to person, place, and time.     Gait: Gait is intact.  Psychiatric:        Mood and Affect: Mood and affect normal.      Results for orders placed or performed in visit on 01/14/24  POC HgB A1c  Result Value Ref Range   Hemoglobin A1C 5.8 (A) 4.0 - 5.6 %   HbA1c POC (<> result, manual entry)     HbA1c, POC (prediabetic range)     HbA1c, POC (controlled diabetic range)         Assessment & Plan:    Routine Health Maintenance and Physical Exam  Immunization History  Administered Date(s) Administered   DTaP 10/13/1996, 03/11/1997, 10/12/1997, 07/03/2001   Fluzone Influenza virus vaccine,trivalent (IIV3), split virus 06/15/2010, 05/23/2011   HIB (PRP-OMP) 10/13/1996, 12/03/1996, 03/11/1997, 10/12/1997   HIB, Unspecified 10/13/1996, 12/03/1996, 03/11/1997, 10/12/1997   HPV Quadrivalent 03/04/2011, 04/20/2011, 09/12/2011   Hepatitis A, Ped/Adol-2 Dose 06/15/2010, 03/04/2011   Hepatitis B, PED/ADOLESCENT 02[birth year]14[birth year]96, 09/10/1996, 10/12/1997, 07/03/2001   IPV 10/13/1996, 12/03/1996, 03/11/1997, 07/03/2001   Influenza, Seasonal, Injecte, Preservative Fre 08/21/2011, 07/15/2023   Influenza,inj,Quad PF,6+ Mos 07/13/2014   Influenza,inj,quad, With Preservative 06/15/2010, 05/28/2013   MMR 10/12/1997, 07/03/2001   Meningococcal Acwy, Unspecified 06/15/2010   Meningococcal polysaccharide vaccine (MPSV4) 06/15/2010   Tdap 04/09/2008, 07/15/2023   Varicella 07/03/2001, 06/15/2010    Health Maintenance  Topic Date Due   Pneumococcal Vaccine 92-1 Years old (1 of 2 - PCV) Never done   COVID-19 Vaccine (1 - 2024-25 season) Never done   Hepatitis C Screening  07/14/2024  (Originally 08/07/2014)   HIV Screening  07/14/2024 (Originally 08/08/2011)   INFLUENZA VACCINE  03/20/2024   DTaP/Tdap/Td (7 - Td or Tdap) 07/14/2033   HPV VACCINES  Completed   Meningococcal B Vaccine  Aged Out    Discussed health benefits of physical activity, and encouraged him to engage in regular exercise appropriate for his age and condition.  Routine adult health maintenance  Prediabetes -     POCT glycosylated hemoglobin (Hb A1C) -     Collection capillary blood specimen  Lipid screening -     Lipid panel; Future  Elevated TSH -     Thyroid  peroxidase antibody; Future -  Thyroid  stimulating immunoglobulin; Future -     TSH + free T4; Future  Medication monitoring encounter -     Prolactin; Future  Normal physical exam findings today. Reviewed health maintenance and he Is UTD. I also reviewed the labs from his UC visit and his TSH was elevated, I will recheck this lab and add the autoimmune labs to rule out autoimmune inflammation. I counseled the patient on increasing exercise to 2-3 hours per week at a moderate intensity. I also counseled the patient on using the PRN hydroxyzine  the [facility] provider gave him previously.  Handouts given on healthy sleep habits and healthy eating and exercise. Labs ordered for surveillance of cardiac risk and general organ health.   Return in about 6 months (around 07/16/2024) for prediabetes.     Aida House, MD

## 2024-01-17 ENCOUNTER — Encounter: Payer: Self-pay | Admitting: Family Medicine

## 2024-01-17 ENCOUNTER — Ambulatory Visit: Payer: Self-pay | Admitting: Family Medicine

## 2024-01-20 LAB — THYROID PEROXIDASE ANTIBODY: Thyroperoxidase Ab SerPl-aCnc: <1 [IU]/mL (ref <9)

## 2024-01-20 LAB — PROLACTIN: Prolactin: 52.9 ng/mL — ABNORMAL HIGH (ref 2.0–18.0)

## 2024-01-20 LAB — TSH+FREE T4: TSH W/REFLEX TO FT4: 2.19 m[IU]/L (ref 0.40–4.50)

## 2024-01-20 LAB — THYROID STIMULATING IMMUNOGLOBULIN: TSI: <89 %{baseline} (ref <140)

## 2024-01-30 ENCOUNTER — Other Ambulatory Visit (HOSPITAL_BASED_OUTPATIENT_CLINIC_OR_DEPARTMENT_OTHER): Payer: Self-pay

## 2024-01-30 ENCOUNTER — Ambulatory Visit (INDEPENDENT_AMBULATORY_CARE_PROVIDER_SITE_OTHER): Payer: MEDICAID

## 2024-01-30 ENCOUNTER — Encounter (HOSPITAL_COMMUNITY): Payer: Self-pay

## 2024-01-30 ENCOUNTER — Ambulatory Visit (HOSPITAL_COMMUNITY): Payer: MEDICAID | Admitting: Psychiatry

## 2024-01-30 ENCOUNTER — Other Ambulatory Visit: Payer: Self-pay

## 2024-01-30 VITALS — BP 123/78 | HR 70 | Temp 97.8°F | Ht 72.0 in | Wt 256.0 lb

## 2024-01-30 DIAGNOSIS — F250 Schizoaffective disorder, bipolar type: Secondary | ICD-10-CM | POA: Diagnosis not present

## 2024-01-30 DIAGNOSIS — F411 Generalized anxiety disorder: Secondary | ICD-10-CM

## 2024-01-30 DIAGNOSIS — G4700 Insomnia, unspecified: Secondary | ICD-10-CM

## 2024-01-30 DIAGNOSIS — F209 Schizophrenia, unspecified: Secondary | ICD-10-CM | POA: Diagnosis not present

## 2024-01-30 MED ORDER — HYDROXYZINE HCL 10 MG PO TABS
10.0000 mg | ORAL_TABLET | Freq: Three times a day (TID) | ORAL | 3 refills | Status: DC | PRN
  Filled 2024-01-30: qty 30, 10d supply, fill #0

## 2024-01-30 MED ORDER — UZEDY 125 MG/0.35ML ~~LOC~~ SUSY
125.0000 mg | PREFILLED_SYRINGE | SUBCUTANEOUS | 11 refills | Status: DC
  Filled 2024-01-30 – 2024-03-20 (×4): qty 0.35, 28d supply, fill #0
  Filled 2024-04-17 – 2024-05-11 (×4): qty 0.35, 28d supply, fill #1

## 2024-01-30 MED ORDER — TRAZODONE HCL 100 MG PO TABS
100.0000 mg | ORAL_TABLET | Freq: Every day | ORAL | 3 refills | Status: DC
  Filled 2024-01-30: qty 30, 30d supply, fill #0

## 2024-01-30 NOTE — Progress Notes (Cosign Needed)
 Pt presents today for injection of Uzedy  125mg  given in LEFT arm with no complains. Pt denies all AVH, SI, and HI. Pt states that he is doing good overall. Pt does have worries about his weight gain since starting Uzedy  which was addressed by provider to PT. Pt will be returning in 28 days.    JNL,CMA

## 2024-01-30 NOTE — Progress Notes (Signed)
 Specialty Pharmacy Initial Fill Coordination Note  Lawrence [name] is a 28 y.o. male contacted today regarding initial fill of specialty medication(s) risperiDONE  (Uzedy )   Patient requested Courier to Provider Office   Delivery date: 02/13/24   Verified address: [facility] [address]   Medication will be filled on 6/25.   Patient is aware of $4 copayment.    Appointment is 7/10

## 2024-01-30 NOTE — Progress Notes (Signed)
 [facility] MD/PA/NP OP Progress Note  01/30/2024 12:36 PM Lawrence [name]  MRN:  [id number]  Chief Complaint: I am doing okay Per Grandmother He talks less  HPI: 28 year old male seen today for follow-up psychiatric evaluation.  He has a psychiatric history of depression, intermittent explosive disorder, schizoaffective disorder bipolar type, and schizophrenia.  Currently he is managed on Uzedy  125 mg monthly and Trazodone  100 mg nightly as needed.  He informed Clinical research associate that his medication are effective in managing his psychiatric conditions.   Today patient is well groomed, pleasant, cooperative and engaged in conversation. He informed Clinical research associate that he is doing okay.  Since increasing trazodone  and Uzedy  patient notes that he is now sleeping better.  He does continue to have some increased religious ideology.  He continues to believe that he is God but notes that he believes that he is a little G not a big G (God Designer, fashion/clothing)  .  He also notes that he feels that he has healing powers.  Patient and his family are Jehovah's Witnesses.  His mother reports that this is not part of their religious beliefs.  Patient mother does note that he is less talkative and more engaged with the family since increasing his Uzedy .   Since his last his last visit her reports that his anxiety and depression has reduced. Today provider conducted GAD-7 and patient scored a 2, at his last visit he scored a 5.  Provider also conducted PHQ-9 of patient 6, at last visit he scored a 13.  Today he denies SI/HI/AVH.   Patient endorses increased having an appetite. He notes that his weight is now maintained now.  Prior to starting Uzedy  he was 180 pounds.  Patient now 248 pounds. He was 250 pounds at his last visit. On 05/23/2023 her was 231, 04/12/2023 he was 210 and on 04/24/2022 her was 218. He has been seen by a nutritionist and his PCP. Provider discussed metabolic symptom with patient. At his last visit provider discussed starting  Wellbutrin or metformin combat weight. Todays provider discussed Birch.Brandt. At this time he is not interested in Metformin.    No medication changes made today. Patient agreeable to continue medications as prescribed. Patient will continue diet and exercise to help manage weight. No other concerns noted at this time.  Visit Diagnosis:    ICD-10-CM   1. Generalized anxiety disorder  F41.1 hydrOXYzine  (ATARAX ) 10 MG tablet    2. Schizoaffective disorder, bipolar type (HCC)  F25.0 risperiDONE  ER (UZEDY ) 125 MG/0.35ML SUSY    3. Insomnia, unspecified type  G47.00 traZODone  (DESYREL ) 100 MG tablet    hydrOXYzine  (ATARAX ) 10 MG tablet          Past Psychiatric History: depression, intermittent explosive disorder, schizoaffective disorder bipolar type, and schizophrenia  Past Medical History:  Past Medical History:  Diagnosis Date   ADD (attention deficit disorder)    Asthma    Depressed    Schizoaffective disorder (HCC)    No past surgical history on file.  Family Psychiatric History: Patient denies family history of psychiatric illness; however, patient's grandmother states that the patient's mother saw a psychologist during her 12th grade year of high school.  Patient denies any family members being on medications at this time.   Family History: No family history on file.  Social History:  Social History   Socioeconomic History   Marital status: Single    Spouse name: Not on file   Number of children: Not on file  Years of education: Not on file   Highest education level: 12th grade  Occupational History   Occupation: Unemployed  Tobacco Use   Smoking status: Former    Current packs/day: 0.00    Types: Cigarettes    Quit date: 04/11/2023    Years since quitting: 0.8   Smokeless tobacco: Never  Vaping Use   Vaping status: Never Used  Substance and Sexual Activity   Alcohol use: Not Currently   Drug use: Not Currently    Frequency: 2.0 times per week    Types:  Marijuana   Sexual activity: Never  Other Topics Concern   Not on file  Social History Narrative   Pt is unemployed; lives with grandmother   Social Drivers of Health   Financial Resource Strain: High Risk (01/13/2024)   Overall Financial Resource Strain (CARDIA)    Difficulty of Paying Living Expenses: Very hard  Food Insecurity: No Food Insecurity (01/13/2024)   Hunger Vital Sign    Worried About Running Out of Food in the Last Year: Never true    Ran Out of Food in the Last Year: Never true  Transportation Needs: No Transportation Needs (01/13/2024)   PRAPARE - Administrator, Civil Service (Medical): No    Lack of Transportation (Non-Medical): No  Physical Activity: Unknown (01/13/2024)   Exercise Vital Sign    Days of Exercise per Week: 0 days    Minutes of Exercise per Session: Not on file  Stress: Stress Concern Present (01/13/2024)   [employer] of Occupational Health - Occupational Stress Questionnaire    Feeling of Stress : Rather much  Social Connections: Socially Isolated (01/13/2024)   Social Connection and Isolation Panel    Frequency of Communication with Friends and Family: Never    Frequency of Social Gatherings with Friends and Family: Never    Attends Religious Services: Never    Database administrator or Organizations: No    Attends Engineer, structural: Not on file    Marital Status: Never married    Allergies:  Allergies  Allergen Reactions   Bee Venom Anaphylaxis   Apple Juice Other (See Comments)    Gums itch or hurt   Corn-Containing Products Cough   Lactose Other (See Comments)    GI upset   Milk (Cow) Itching and Other (See Comments)   Other Rash and Other (See Comments)    Green Beans: Asthma   Peanut Butter Flavoring Agent (Non-Screening) Cough    Metabolic Disorder Labs: Lab Results  Component Value Date   HGBA1C 5.8 (A) 01/14/2024   MPG 120 08/13/2022   MPG 114.02 11/14/2019   Lab Results  Component Value  Date   PROLACTIN 52.9 (H) 01/14/2024   PROLACTIN 64.5 (H) 03/26/2023   Lab Results  Component Value Date   CHOL 157 01/14/2024   TRIG 183.0 (H) 01/14/2024   HDL 35.20 (L) 01/14/2024   CHOLHDL 4 01/14/2024   VLDL 36.6 01/14/2024   LDLCALC 85 01/14/2024   LDLCALC 61 08/13/2022   Lab Results  Component Value Date   TSH 5.681 (H) 09/25/2023   TSH 1.960 03/26/2023    Therapeutic Level Labs: No results found for: LITHIUM No results found for: VALPROATE Lab Results  Component Value Date   CBMZ 8.1 11/14/2019    Current Medications: Current Outpatient Medications  Medication Sig Dispense Refill   hydrOXYzine  (ATARAX ) 10 MG tablet Take 1 tablet (10 mg total) by mouth 3 (three) times daily as needed. 30  tablet 3   traZODone  (DESYREL ) 100 MG tablet Take 1 tablet (100 mg total) by mouth at bedtime. 30 tablet 3   risperiDONE  ER (UZEDY ) 125 MG/0.35ML SUSY Inject 0.35 mLs (125 mg total) into the skin every 28 (twenty-eight) days. 0.35 mL 11   Current Facility-Administered Medications  Medication Dose Route Frequency Provider Last Rate Last Admin   risperiDONE  ER (UZEDY ) SUSY 125 mg  125 mg Subcutaneous Q28 days    125 mg at 01/30/24 1040     Musculoskeletal: Strength & Muscle Tone: within normal limits Gait & Station: normal Patient leans: N/A  Psychiatric Specialty Exam: Review of Systems  There were no vitals taken for this visit.There is no height or weight on file to calculate BMI.  General Appearance: Well Groomed  Eye Contact:  Good  Speech:  Clear and Coherent   Volume:  Normal  Mood:  Depressed  Affect:  Appropriate and Congruent  Thought Process:  Coherent, Goal Directed, and Linear  Orientation:  Full (Time, Place, and Person)  Thought Content: WDL and Logical   Suicidal Thoughts:  No  Homicidal Thoughts:  No  Memory:  Immediate;   Good Recent;   Good Remote;   Good  Judgement:  Good  Insight:  Good  Psychomotor Activity:  Normal  Concentration:   Concentration: Good and Attention Span: Good  Recall:  Good  Fund of Knowledge: Good  Language: Good  Akathisia:  No  Handed:  Right  AIMS (if indicated): not done,   Assets:  Communication Skills Desire for Improvement Financial Resources/Insurance Housing Leisure Time Physical Health Social Support Transportation  ADL's:  Intact  Cognition: WNL  Sleep:  Good   Screenings: AIMS    Flowsheet Row Office Visit from 12/05/2023 in [facility] Office Visit from 05/23/2023 in [facility] Office Visit from 12/25/2022 [facility]  AIMS Total Score 0 0 0 0 0   AUDIT    Flowsheet Row Admission (Discharged) from [facility] (Discharged) from [facility] (Dis[facility]) [facility]  Alcohol Use Disorder Identification Test Final Score (AUDIT) 0 0 0 3   GAD-7    Flowsheet Row Office Visit from 01/30/2024 in [facility] Office Visit from 12/05/2023 in [facility] Counselor from 09/09/2023 in [facility] Office Visit from 05/23/2023 in [facility] Clinical Support from 02/28/2023 in [facility]  Total GAD-7 Score 2 5 12  0 9   PHQ2-9    Flowsheet Row Office Visit from 01/30/2024 in [facility] Most recent reading at 01/30/2024 12:00 PM Office Visit from 01/14/2024 in [facility] Most  recent reading at 01/14/2024 10:25 AM Office Visit from 12/05/2023 in [facility] Most recent reading at 12/05/2023 11:58 AM ED from 09/25/2023 in [facility] Most recent reading at 09/28/2023  3:43 PM ED from 09/25/2023 in [facility] Most recent reading at 09/25/2023  9:10 PM  PHQ-2 Total Score 3 3 3  0 3  PHQ-9 Total Score 6  6 13 0 8   Flowsheet Row Office Visit from 01/30/2024 in [facility] Office Visit from 12/05/2023 in [facility] ED from 09/25/2023 in [facility]  C-SSRS RISK CATEGORY Error: Q7 should not be populated when Q6 is No Error: Q7 should not be populated when Q6 is No Error: Question 6 not populated     Assessment and Plan: Patient notes that he his anxiety, depression, sleep, and mood has improved since his last visit. No medication changes made today. Patient agreeable to continue medications as prescribed. Patient will continue diet and exercise to help manage weight.  1. Schizoaffective disorder, bipolar type (HCC)  Continue- risperiDONE  ER (UZEDY ) 125 MG/0.35ML SUSY; Inject 0.35 mLs (125 mg total) into the skin every 28 (twenty-eight) days.  Dispense: 0.35 mL; Refill: 11  2. Generalized anxiety disorder (Primary)  Continue- hydrOXYzine  (ATARAX ) 10 MG tablet; Take 1 tablet (10 mg total) by mouth 3 (three) times daily as needed.  Dispense: 30 tablet; Refill: 3  3. Insomnia, unspecified type  Continue- traZODone  (DESYREL ) 100 MG tablet; Take 1 tablet (100 mg total) by mouth at bedtime.  Dispense: 30 tablet; Refill: 3 Continue- hydrOXYzine  (ATARAX ) 10 MG tablet; Take 1 tablet (10 mg total) by mouth 3 (three) times daily as needed.  Dispense: 30 tablet; Refill: 3        Collaboration of Care: Collaboration of Care: Other provider involved in patient's care [facility] staff and PCP  Patient/Guardian was  advised Release of Information must be obtained prior to any record release in order to collaborate their care with an outside provider. Patient/Guardian was advised if they have not already done so to contact the registration department to sign all necessary forms in order for us  to release information regarding their care.   Consent: Patient/Guardian gives verbal consent for treatment and assignment of benefits for services provided during this visit. Patient/Guardian expressed understanding and agreed to proceed.   Follow up in 2.5 months Follow up with shot clinic in a month Arlyne Bering, NP 01/30/2024, 12:36 PM

## 2024-02-01 ENCOUNTER — Other Ambulatory Visit (HOSPITAL_BASED_OUTPATIENT_CLINIC_OR_DEPARTMENT_OTHER): Payer: Self-pay

## 2024-02-04 ENCOUNTER — Other Ambulatory Visit: Payer: Self-pay

## 2024-02-05 ENCOUNTER — Other Ambulatory Visit: Payer: Self-pay

## 2024-02-08 ENCOUNTER — Other Ambulatory Visit (HOSPITAL_BASED_OUTPATIENT_CLINIC_OR_DEPARTMENT_OTHER): Payer: Self-pay

## 2024-02-08 ENCOUNTER — Other Ambulatory Visit (HOSPITAL_COMMUNITY): Payer: Self-pay

## 2024-02-10 ENCOUNTER — Other Ambulatory Visit (HOSPITAL_BASED_OUTPATIENT_CLINIC_OR_DEPARTMENT_OTHER): Payer: Self-pay

## 2024-02-17 ENCOUNTER — Other Ambulatory Visit (HOSPITAL_COMMUNITY): Payer: Self-pay

## 2024-02-27 ENCOUNTER — Ambulatory Visit (HOSPITAL_COMMUNITY): Payer: MEDICAID

## 2024-02-27 VITALS — BP 121/84 | HR 90 | Ht 72.0 in | Wt 252.0 lb

## 2024-02-27 DIAGNOSIS — F250 Schizoaffective disorder, bipolar type: Secondary | ICD-10-CM

## 2024-02-27 NOTE — Progress Notes (Cosign Needed)
 Pt presents today for injection of Uzedy  125mg  given in RIGHT- arm with no complains. Pt denies all AVH, SI, and HI. Pt states that he is doing good overall. Pt does have worries about his weight gain since starting Uzedy  which was addressed by provider to PT. Pt will be returning in 28 days.      JNL,CMA

## 2024-03-18 ENCOUNTER — Other Ambulatory Visit: Payer: Self-pay

## 2024-03-20 ENCOUNTER — Other Ambulatory Visit: Payer: Self-pay

## 2024-03-20 NOTE — Progress Notes (Signed)
 Specialty Pharmacy Refill Coordination Note  Lawrence [name] is a 28 y.o. male assessed today regarding refills of clinic administered specialty medication(s) risperiDONE  (Uzedy )   Clinic requested Courier to Provider Office   Delivery date: 03/24/24   Verified address: [facility] [address]   Medication will be filled on 08.04.25.   Appointment on 03/26/24

## 2024-03-23 ENCOUNTER — Other Ambulatory Visit: Payer: Self-pay

## 2024-03-23 NOTE — Progress Notes (Signed)
 Need Payment info for Rx of copay $4. Left voicemail and sent Mychart message to patient. Will AR for now but place try to get payment info.

## 2024-03-24 ENCOUNTER — Other Ambulatory Visit: Payer: Self-pay

## 2024-03-26 ENCOUNTER — Ambulatory Visit (INDEPENDENT_AMBULATORY_CARE_PROVIDER_SITE_OTHER): Payer: MEDICAID

## 2024-03-26 ENCOUNTER — Ambulatory Visit (HOSPITAL_COMMUNITY): Payer: MEDICAID | Admitting: Physician Assistant

## 2024-03-26 ENCOUNTER — Encounter (HOSPITAL_COMMUNITY): Payer: Self-pay | Admitting: Physician Assistant

## 2024-03-26 VITALS — BP 141/80 | HR 87 | Ht 72.0 in | Wt 260.0 lb

## 2024-03-26 DIAGNOSIS — F250 Schizoaffective disorder, bipolar type: Secondary | ICD-10-CM | POA: Diagnosis not present

## 2024-03-26 DIAGNOSIS — F411 Generalized anxiety disorder: Secondary | ICD-10-CM | POA: Diagnosis not present

## 2024-03-26 DIAGNOSIS — F209 Schizophrenia, unspecified: Secondary | ICD-10-CM

## 2024-03-26 DIAGNOSIS — G4700 Insomnia, unspecified: Secondary | ICD-10-CM

## 2024-03-26 MED ORDER — TRAZODONE HCL 100 MG PO TABS
100.0000 mg | ORAL_TABLET | Freq: Every day | ORAL | 1 refills | Status: DC
  Filled 2024-03-26: qty 30, 30d supply, fill #0

## 2024-03-26 MED ORDER — HYDROXYZINE HCL 10 MG PO TABS
10.0000 mg | ORAL_TABLET | Freq: Three times a day (TID) | ORAL | 1 refills | Status: DC | PRN
  Filled 2024-03-26: qty 30, 10d supply, fill #0

## 2024-03-26 NOTE — Progress Notes (Cosign Needed)
 Pt presents today for injection of Uzedy  125mg  given in Left- arm with no complains. Pt denies all AVH, SI, and HI. Pt states that he is doing good overall. Pt does have worries about his weight gain since starting Uzedy  which was addressed by provider to PT. Pt will be returning in 28 days.      JNL,CMA

## 2024-03-26 NOTE — Progress Notes (Signed)
 [facility] MD/PA/NP OP Progress Note  03/26/2024 10:38 PM Lawrence [name]  MRN:  [id number]  Chief Complaint:  Chief Complaint  Patient presents with   Follow-up   HPI:   Lawrence [name] is a 28 year old male with a past psychiatric history significant for schizoaffective disorder (bipolar type), generalized anxiety disorder, and insomnia who presents to [facility] for follow-up and medication management.  Patient was last seen by Dr. Harl on 01/30/2024.  During his last encounter, patient was being managed on the following psychiatric medications:  Hydroxyzine  10 mg 3 times daily as needed Trazodone  100 mg at bedtime as needed Risperidone  ER (Uzedy )) 25 mg / 0.35 mL every 28 days  Patient reports no issues or concerns regarding his current medication regimen. Patient denies experiencing any adverse side effects at this time. Patient denies overt depressive symptoms nor does he endorse anxiety.  Patient denies changes in his behavior or paranoia.  He further denies auditory or visual hallucinations and does not appear to be responding to internal/external stimuli.  A PHQ-9 screen was performed with the patient scoring a 6.  A GAD-7 screen was also performed with the patient scoring a 1.  Patient reports that his weight has still been steadily increasing.  He reports that his weight for today was 260 pounds.  Last time he presented to this facility, he weighed 252 pounds.  Patient reports that he currently has a primary care provider and will be seeing them in November.  Provider discussed options for managing patient's weight; however, patient refused medication options at this time.  Patient informed provider that he would try working out to help with his weight.  Patient is alert and oriented x 4, calm, cooperative, and fully engaged in conversation during the encounter.  Patient endorses good mood.  Patient exhibits euthymic mood with appropriate affect.   Patient denies suicidal or homicidal ideations.  He further denies auditory or visual hallucinations and does not appear to be responding to internal/external stimuli.  Patient endorses fair sleep and states that he has been waking up a few times throughout the night.  He reports that last night was the only time where he recalls getting a full night's rest.  Patient endorses good appetite and eats on average 2-3 meals per day.  Patient endorses alcohol consumption on occasion.  Patient denies tobacco use or illicit drug use.  Visit Diagnosis:    ICD-10-CM   1. Schizoaffective disorder, bipolar type (HCC) [F25.0]  F25.0     2. Generalized anxiety disorder  F41.1 hydrOXYzine  (ATARAX ) 10 MG tablet    3. Insomnia, unspecified type  G47.00 hydrOXYzine  (ATARAX ) 10 MG tablet    traZODone  (DESYREL ) 100 MG tablet      Past Psychiatric History:  Depression Intermittent explosive disorder Schizoaffective disorder bipolar type Schizophrenia   Past Medical History:  Past Medical History:  Diagnosis Date   ADD (attention deficit disorder)    Asthma    Depressed    Schizoaffective disorder (HCC)    History reviewed. No pertinent surgical history.  Family Psychiatric History:  Patient denies family history of psychiatric illness; however, patient's grandmother states that the patient's mother saw a psychologist during her 12th grade year of high school.  Patient denies any family members being on medications at this time.   Family History: History reviewed. No pertinent family history.  Social History:  Social History   Socioeconomic History   Marital status: Single    Spouse name: Not  on file   Number of children: Not on file   Years of education: Not on file   Highest education level: 12th grade  Occupational History   Occupation: Unemployed  Tobacco Use   Smoking status: Former    Current packs/day: 0.00    Types: Cigarettes    Quit date: 04/11/2023    Years since quitting: 0.9    Smokeless tobacco: Never  Vaping Use   Vaping status: Never Used  Substance and Sexual Activity   Alcohol use: Not Currently   Drug use: Not Currently    Frequency: 2.0 times per week    Types: Marijuana   Sexual activity: Never  Other Topics Concern   Not on file  Social History Narrative   Pt is unemployed; lives with grandmother   Social Drivers of Health   Financial Resource Strain: High Risk (01/13/2024)   Overall Financial Resource Strain (CARDIA)    Difficulty of Paying Living Expenses: Very hard  Food Insecurity: No Food Insecurity (01/13/2024)   Hunger Vital Sign    Worried About Running Out of Food in the Last Year: Never true    Ran Out of Food in the Last Year: Never true  Transportation Needs: No Transportation Needs (01/13/2024)   PRAPARE - Administrator, Civil Service (Medical): No    Lack of Transportation (Non-Medical): No  Physical Activity: Unknown (01/13/2024)   Exercise Vital Sign    Days of Exercise per Week: 0 days    Minutes of Exercise per Session: Not on file  Stress: Stress Concern Present (01/13/2024)   [employer] of Occupational Health - Occupational Stress Questionnaire    Feeling of Stress : Rather much  Social Connections: Socially Isolated (01/13/2024)   Social Connection and Isolation Panel    Frequency of Communication with Friends and Family: Never    Frequency of Social Gatherings with Friends and Family: Never    Attends Religious Services: Never    Database administrator or Organizations: No    Attends Engineer, structural: Not on file    Marital Status: Never married    Allergies:  Allergies  Allergen Reactions   Bee Venom Anaphylaxis   Apple Juice Other (See Comments)    Gums itch or hurt   Corn-Containing Products Cough   Lactose Other (See Comments)    GI upset   Milk (Cow) Itching and Other (See Comments)   Other Rash and Other (See Comments)    Green Beans: Asthma   Peanut Butter Flavoring Agent  (Non-Screening) Cough    Metabolic Disorder Labs: Lab Results  Component Value Date   HGBA1C 5.8 (A) 01/14/2024   MPG 120 08/13/2022   MPG 114.02 11/14/2019   Lab Results  Component Value Date   PROLACTIN 52.9 (H) 01/14/2024   PROLACTIN 64.5 (H) 03/26/2023   Lab Results  Component Value Date   CHOL 157 01/14/2024   TRIG 183.0 (H) 01/14/2024   HDL 35.20 (L) 01/14/2024   CHOLHDL 4 01/14/2024   VLDL 36.6 01/14/2024   LDLCALC 85 01/14/2024   LDLCALC 61 08/13/2022   Lab Results  Component Value Date   TSH 5.681 (H) 09/25/2023   TSH 1.960 03/26/2023    Therapeutic Level Labs: No results found for: LITHIUM No results found for: VALPROATE Lab Results  Component Value Date   CBMZ 8.1 11/14/2019    Current Medications: Current Outpatient Medications  Medication Sig Dispense Refill   hydrOXYzine  (ATARAX ) 10 MG tablet Take 1 tablet (  10 mg total) by mouth 3 (three) times daily as needed. 30 tablet 1   risperiDONE  ER (UZEDY ) 125 MG/0.35ML SUSY Inject 0.35 mLs (125 mg total) into the skin every 28 (twenty-eight) days. 0.35 mL 11   traZODone  (DESYREL ) 100 MG tablet Take 1 tablet (100 mg total) by mouth at bedtime. 30 tablet 1   Current Facility-Administered Medications  Medication Dose Route Frequency Provider Last Rate Last Admin   risperiDONE  ER (UZEDY ) SUSY 125 mg  125 mg Subcutaneous Q28 days    125 mg at 03/26/24 1614     Musculoskeletal: Strength & Muscle Tone: within normal limits Gait & Station: normal Patient leans: N/A  Psychiatric Specialty Exam: Review of Systems  Psychiatric/Behavioral:  Positive for sleep disturbance. Negative for decreased concentration, dysphoric mood, hallucinations, self-injury and suicidal ideas. The patient is not nervous/anxious and is not hyperactive.     There were no vitals taken for this visit.There is no height or weight on file to calculate BMI.  General Appearance: Well Groomed  Eye Contact:  Good  Speech:  Clear and  Coherent and Normal Rate  Volume:  Normal  Mood:  Euthymic  Affect:  Appropriate  Thought Process:  Coherent, Goal Directed, and Descriptions of Associations: Intact  Orientation:  Full (Time, Place, and Person)  Thought Content: WDL   Suicidal Thoughts:  No  Homicidal Thoughts:  No  Memory:  Immediate;   Good Recent;   Good Remote;   Good  Judgement:  Good  Insight:  Good  Psychomotor Activity:  Normal  Concentration:  Concentration: Good and Attention Span: Good  Recall:  Good  Fund of Knowledge: Good  Language: Good  Akathisia:  No  Handed:  Right  AIMS (if indicated): done; 0  Assets:  Communication Skills Desire for Improvement Financial Resources/Insurance Housing Leisure Time Physical Health Social Support Transportation  ADL's:  Intact  Cognition: WNL  Sleep:  Fair   Screenings: AIMS    Flowsheet Row Clinical Support from 03/26/2024 in [facility] Office Visit from 12/05/2023 in [facility] Office Visit from 05/23/2023 in [facility] Office Visit from 12/25/2022 in [facility]  AIMS Total Score 0 0 0 0 0   AUDIT    Flowsheet Row Admission (Discharged) from OP [facility] (Dis[facility] (Discharged) from [facility] (Discharged) [facility]  Alcohol Use Disorder Identification Test Final Score (AUDIT) 0 0 0 3   GAD-7    Flowsheet Row Clinical Support from 03/26/2024 in [facility] Office Visit from 01/30/2024 in [facility] Office Visit from 12/05/2023 in [facility] Counselor from 09/09/2023 in [facility] Office Visit from 05/23/2023 in [facility]  Total GAD-7 Score 1 2 5 12  0   PHQ2-9    Flowsheet Row Clinical Support from 03/26/2024 in [facility] Office Visit from 01/30/2024 in [facility] Office Visit from 01/14/2024 in [facility] Office Visit from 12/05/2023 in [facility] from 09/25/2023 in [facility]  PHQ-2 Total Score 2 3 3 3  0  PHQ-9 Total Score 6 6 6 13  0   Flowsheet Row Clinical Support from 03/26/2024 in [facility] Office Visit from 01/30/2024 in [facility] Office Visit from 12/05/2023 in [facility]  C-SSRS RISK CATEGORY No Risk Error: Q7 should not be populated when Q6 is No Error: Q7 should not be populated when Q6 is No     Assessment and Plan:   Lawrence [name] is a 28 year old male with a past psychiatric history significant for schizoaffective disorder (bipolar type), generalized anxiety disorder, and insomnia who presents to [facility] for follow-up and medication management.  Patient reports that he continues to take his medications regularly and denies experiencing adverse side effects.  An aims assessment was performed with the patient scoring of 0.  Patient reports that his weight continues to steadily increase.  His weight during this encounter was 260 pounds while his weight during his last encounter was 252 pounds.  Provider discussed medication options for managing patient's weight, but patient refused.  Patient informed provider that he would try to manage his weight to diet and exercise.  Patient denies overt depressive symptoms nor does he endorse anxiety at this time.  A  PHQ-9 screen was performed the patient scoring a 6.  A GAD-7 screen was also performed with the patient scoring a 1.  Patient denies changes in his behavior nor does she endorse paranoia.  Patient does not appear to be responding to internal/external stimuli.  Patient endorses stability of her brother to continue taking his medications as prescribed.  Patient's medications to be e-prescribed to pharmacy of choice.  A [place] Suicide Severity Rating Scale was performed with the patient being considered no risk.  Patient denies suicidal ideations and is able to contract for safety following the conclusion of the encounter.  Collaboration of Care: Collaboration of Care: Medication Management AEB provider managing patient's psychiatric medications, Primary Care Provider AEB patient being seen by a family medicine provider, and Psychiatrist AEB patient being followed by mental health provider at this facility  Patient/Guardian was advised Release of Information must be obtained prior to any record release in order to collaborate their care with an outside provider. Patient/Guardian was advised if they have not already done so to contact the registration department to sign all necessary forms in order for us  to release information regarding their care.   Consent: Patient/Guardian gives verbal consent for treatment and assignment of benefits for services provided during this visit. Patient/Guardian expressed understanding and agreed to proceed.   1. Schizoaffective disorder, bipolar type (HCC) [F25.0] (Primary) Patient to continue receiving Risperidone  ER (Uzedy ) 125 mg / 0.35 mL every 28 days for the management of the schizoaffective disorder (bipolar type)  2. Generalized anxiety disorder  - hydrOXYzine  (ATARAX ) 10 MG tablet; Take 1 tablet (10 mg total) by mouth 3 (three) times daily as needed.  Dispense: 30 tablet; Refill: 1  3. Insomnia, unspecified type  - hydrOXYzine  (ATARAX ) 10 MG tablet; Take 1  tablet (10 mg total) by mouth 3 (three) times daily as needed.  Dispense: 30 tablet; Refill: 1 - traZODone  (DESYREL ) 100 MG tablet; Take 1 tablet (100 mg total) by mouth at bedtime.  Dispense: 30 tablet; Refill: 1  Patient to follow up in 2 months Provider spent a total of 15 minutes with the patient/reviewing patient's chart  Reginia FORBES Bolster, PA 03/26/2024, 10:38 PM

## 2024-03-27 ENCOUNTER — Other Ambulatory Visit (HOSPITAL_BASED_OUTPATIENT_CLINIC_OR_DEPARTMENT_OTHER): Payer: Self-pay

## 2024-04-06 ENCOUNTER — Other Ambulatory Visit (HOSPITAL_BASED_OUTPATIENT_CLINIC_OR_DEPARTMENT_OTHER): Payer: Self-pay

## 2024-04-10 ENCOUNTER — Other Ambulatory Visit (HOSPITAL_COMMUNITY): Payer: Self-pay

## 2024-04-16 ENCOUNTER — Other Ambulatory Visit: Payer: Self-pay

## 2024-04-17 ENCOUNTER — Other Ambulatory Visit: Payer: Self-pay | Admitting: Pharmacy Technician

## 2024-04-17 ENCOUNTER — Other Ambulatory Visit: Payer: Self-pay

## 2024-04-17 NOTE — Progress Notes (Signed)
 Last filled with [facility] on 8/22. Adjusting next call date. Next appointment date after Sept. Is October 2nd.

## 2024-04-23 ENCOUNTER — Ambulatory Visit (INDEPENDENT_AMBULATORY_CARE_PROVIDER_SITE_OTHER): Payer: MEDICAID

## 2024-04-23 ENCOUNTER — Encounter (HOSPITAL_COMMUNITY): Payer: Self-pay

## 2024-04-23 VITALS — BP 119/75 | HR 80 | Wt 265.6 lb

## 2024-04-23 DIAGNOSIS — G4700 Insomnia, unspecified: Secondary | ICD-10-CM

## 2024-04-23 DIAGNOSIS — F411 Generalized anxiety disorder: Secondary | ICD-10-CM | POA: Diagnosis not present

## 2024-04-23 DIAGNOSIS — F200 Paranoid schizophrenia: Secondary | ICD-10-CM

## 2024-04-23 NOTE — Progress Notes (Signed)
 Pt presents today for injection of Uzedy  125mg  given in RIGHT- arm with no complains. Pt denies all AVH, SI, and HI. Pt states that he is doing good overall. Pt Stated his weight is starting to increase more that he plans to work on it .

## 2024-05-11 ENCOUNTER — Other Ambulatory Visit: Payer: Self-pay

## 2024-05-11 NOTE — Progress Notes (Signed)
 Specialty Pharmacy Refill Coordination Note  Lawrence [name] is a 28 y.o. male contacted today regarding refills of specialty medication(s) risperiDONE  (Uzedy )   Patient requested Courier to Provider Office   Delivery date: 05/25/24   Verified address: [facility] [address]   Medication will be filled on 10/3.

## 2024-05-20 ENCOUNTER — Other Ambulatory Visit (HOSPITAL_COMMUNITY): Payer: Self-pay

## 2024-05-21 ENCOUNTER — Encounter (HOSPITAL_COMMUNITY): Payer: MEDICAID | Admitting: Psychiatry

## 2024-05-21 ENCOUNTER — Ambulatory Visit (HOSPITAL_COMMUNITY): Payer: MEDICAID

## 2024-05-22 ENCOUNTER — Other Ambulatory Visit: Payer: Self-pay

## 2024-05-26 ENCOUNTER — Other Ambulatory Visit: Payer: Self-pay

## 2024-05-26 ENCOUNTER — Ambulatory Visit (INDEPENDENT_AMBULATORY_CARE_PROVIDER_SITE_OTHER): Payer: MEDICAID

## 2024-05-26 ENCOUNTER — Encounter (HOSPITAL_COMMUNITY): Payer: Self-pay

## 2024-05-26 ENCOUNTER — Other Ambulatory Visit (HOSPITAL_BASED_OUTPATIENT_CLINIC_OR_DEPARTMENT_OTHER): Payer: Self-pay

## 2024-05-26 ENCOUNTER — Encounter (HOSPITAL_COMMUNITY): Payer: Self-pay | Admitting: Psychiatry

## 2024-05-26 ENCOUNTER — Ambulatory Visit (INDEPENDENT_AMBULATORY_CARE_PROVIDER_SITE_OTHER): Payer: MEDICAID | Admitting: Psychiatry

## 2024-05-26 VITALS — BP 117/73 | HR 83 | Temp 98.1°F | Ht 73.0 in | Wt 268.6 lb

## 2024-05-26 DIAGNOSIS — G4700 Insomnia, unspecified: Secondary | ICD-10-CM | POA: Diagnosis not present

## 2024-05-26 DIAGNOSIS — F250 Schizoaffective disorder, bipolar type: Secondary | ICD-10-CM | POA: Diagnosis not present

## 2024-05-26 DIAGNOSIS — F411 Generalized anxiety disorder: Secondary | ICD-10-CM

## 2024-05-26 MED ORDER — TRAZODONE HCL 100 MG PO TABS
100.0000 mg | ORAL_TABLET | Freq: Every day | ORAL | 1 refills | Status: AC
  Filled 2024-05-26: qty 30, 30d supply, fill #0

## 2024-05-26 MED ORDER — HYDROXYZINE HCL 10 MG PO TABS
10.0000 mg | ORAL_TABLET | Freq: Three times a day (TID) | ORAL | 1 refills | Status: DC | PRN
  Filled 2024-05-26: qty 30, 10d supply, fill #0

## 2024-05-26 MED ORDER — UZEDY 125 MG/0.35ML ~~LOC~~ SUSY
125.0000 mg | PREFILLED_SYRINGE | SUBCUTANEOUS | 11 refills | Status: AC
  Filled 2024-05-26 – 2024-06-12 (×2): qty 0.35, 28d supply, fill #0
  Filled 2024-07-13 (×3): qty 0.35, 28d supply, fill #1

## 2024-05-26 NOTE — Progress Notes (Signed)
 BH MD/PA/NP OP Progress Note  05/26/2024 9:52 AM Lawrence [name]  MRN:  [id number]  Chief Complaint: Things are the same Per Grandmother He has no motivation  HPI: 28 year old male seen today for follow-up psychiatric evaluation.  He has a psychiatric history of depression, intermittent explosive disorder, schizoaffective disorder bipolar type, and schizophrenia.  Currently he is managed on hydroxyzine  10 mg three times daily as needed, Uzedy  125 mg monthly and Trazodone  100 mg nightly as needed.  He informed Clinical research associate that his medication are effective in managing his psychiatric conditions. He notes that he takes trazodone  and hydroxyzine  as need.   Today patient is well groomed, pleasant, cooperative and engaged in conversation. He informed Clinical research associate that things are the same. He reports that he has been looking for a job. He informed Clinical research associate that he applied but did not get the position. Provider gave patient resources to Vocational Rehab. Patient notes his mood is stable and reports that he has minimal anxiety/depression. Today provider conducted a GAD 7 and patient scored a 3. Provider also conducted a PHQ 9 and patient scored an 8. He endorses adequate sleep and appetite.  Patient was seen with his grandmother who notes that he has little motivation. Patient notes that he is just taking his time but is motivated. At this time he notes that he does not wish to start other medications.   Patient weight continues to increase. He is now 268.6 pounds. He was 260 pounds at his last visit. On 05/23/2023 her was 231, 04/12/2023 he was 210 and on 04/24/2022 her was 218. He has been seen by a nutritionist and his PCP. Provider discussed metabolic symptom with patient. At his last visit provider discussed starting Wellbutrin or metformin combat weight. Todays provider discussed Birch.Brandt. At this time he is not interested in Metformin.    No medication changes made today. Patient agreeable to continue medications as  prescribed. Patient will continue diet and exercise to help manage weight. No other concerns noted at this time.  Visit Diagnosis:  No diagnosis found.       Past Psychiatric History: depression, intermittent explosive disorder, schizoaffective disorder bipolar type, and schizophrenia  Past Medical History:  Past Medical History:  Diagnosis Date   ADD (attention deficit disorder)    Asthma    Depressed    Schizoaffective disorder (HCC)    No past surgical history on file.  Family Psychiatric History: Patient denies family history of psychiatric illness; however, patient's grandmother states that the patient's mother saw a psychologist during her 12th grade year of high school.  Patient denies any family members being on medications at this time.   Family History: No family history on file.  Social History:  Social History   Socioeconomic History   Marital status: Single    Spouse name: Not on file   Number of children: Not on file   Years of education: Not on file   Highest education level: 12th grade  Occupational History   Occupation: Unemployed  Tobacco Use   Smoking status: Former    Current packs/day: 0.00    Types: Cigarettes    Quit date: 04/11/2023    Years since quitting: 1.1   Smokeless tobacco: Never  Vaping Use   Vaping status: Never Used  Substance and Sexual Activity   Alcohol use: Not Currently   Drug use: Not Currently    Frequency: 2.0 times per week    Types: Marijuana   Sexual activity: Never  Other Topics Concern  Not on file  Social History Narrative   Pt is unemployed; lives with grandmother   Social Drivers of Health   Financial Resource Strain: High Risk (01/13/2024)   Overall Financial Resource Strain (CARDIA)    Difficulty of Paying Living Expenses: Very hard  Food Insecurity: No Food Insecurity (01/13/2024)   Hunger Vital Sign    Worried About Running Out of Food in the Last Year: Never true    Ran Out of Food in the Last Year:  Never true  Transportation Needs: No Transportation Needs (01/13/2024)   PRAPARE - Administrator, Civil Service (Medical): No    Lack of Transportation (Non-Medical): No  Physical Activity: Unknown (01/13/2024)   Exercise Vital Sign    Days of Exercise per Week: 0 days    Minutes of Exercise per Session: Not on file  Stress: Stress Concern Present (01/13/2024)   [employer] of Occupational Health - Occupational Stress Questionnaire    Feeling of Stress : Rather much  Social Connections: Socially Isolated (01/13/2024)   Social Connection and Isolation Panel    Frequency of Communication with Friends and Family: Never    Frequency of Social Gatherings with Friends and Family: Never    Attends Religious Services: Never    Database administrator or Organizations: No    Attends Engineer, structural: Not on file    Marital Status: Never married    Allergies:  Allergies  Allergen Reactions   Bee Venom Anaphylaxis   Apple Juice Other (See Comments)    Gums itch or hurt   Corn-Containing Products Cough   Lactose Other (See Comments)    GI upset   Milk (Cow) Itching and Other (See Comments)   Other Rash and Other (See Comments)    Green Beans: Asthma   Peanut Butter Flavoring Agent (Non-Screening) Cough    Metabolic Disorder Labs: Lab Results  Component Value Date   HGBA1C 5.8 (A) 01/14/2024   MPG 120 08/13/2022   MPG 114.02 11/14/2019   Lab Results  Component Value Date   PROLACTIN 52.9 (H) 01/14/2024   PROLACTIN 64.5 (H) 03/26/2023   Lab Results  Component Value Date   CHOL 157 01/14/2024   TRIG 183.0 (H) 01/14/2024   HDL 35.20 (L) 01/14/2024   CHOLHDL 4 01/14/2024   VLDL 36.6 01/14/2024   LDLCALC 85 01/14/2024   LDLCALC 61 08/13/2022   Lab Results  Component Value Date   TSH 5.681 (H) 09/25/2023   TSH 1.960 03/26/2023    Therapeutic Level Labs: No results found for: LITHIUM No results found for: VALPROATE Lab Results  Component  Value Date   CBMZ 8.1 11/14/2019    Current Medications: Current Outpatient Medications  Medication Sig Dispense Refill   hydrOXYzine  (ATARAX ) 10 MG tablet Take 1 tablet (10 mg total) by mouth 3 (three) times daily as needed. 30 tablet 1   risperiDONE  ER (UZEDY ) 125 MG/0.35ML SUSY Inject 0.35 mLs (125 mg total) into the skin every 28 (twenty-eight) days. 0.35 mL 11   traZODone  (DESYREL ) 100 MG tablet Take 1 tablet (100 mg total) by mouth at bedtime. 30 tablet 1   Current Facility-Administered Medications  Medication Dose Route Frequency Provider Last Rate Last Admin   risperiDONE  ER (UZEDY ) SUSY 125 mg  125 mg Subcutaneous Q28 days    125 mg at 03/26/24 1614     Musculoskeletal: Strength & Muscle Tone: within normal limits Gait & Station: normal Patient leans: N/A  Psychiatric Specialty Exam: Review  of Systems  There were no vitals taken for this visit.There is no height or weight on file to calculate BMI.  General Appearance: Well Groomed  Eye Contact:  Good  Speech:  Clear and Coherent   Volume:  Normal  Mood:  Depressed  Affect:  Appropriate and Congruent  Thought Process:  Coherent, Goal Directed, and Linear  Orientation:  Full (Time, Place, and Person)  Thought Content: WDL and Logical   Suicidal Thoughts:  No  Homicidal Thoughts:  No  Memory:  Immediate;   Good Recent;   Good Remote;   Good  Judgement:  Good  Insight:  Good  Psychomotor Activity:  Normal  Concentration:  Concentration: Good and Attention Span: Good  Recall:  Good  Fund of Knowledge: Good  Language: Good  Akathisia:  No  Handed:  Right  AIMS (if indicated): not done,   Assets:  Communication Skills Desire for Improvement Financial Resources/Insurance Housing Leisure Time Physical Health Social Support Transportation  ADL's:  Intact  Cognition: WNL  Sleep:  Good   Screenings: AIMS    Flowsheet Row Clinical Support from 03/26/2024 in [facility] Office  Visit from 12/05/2023 in [facility] Office Visit from 05/23/2023 in [facility] Office Visit from 12/25/2022 in [facility]  AIMS Total Score 0 0 0 0 0   AUDIT    Flowsheet Row Admission (Discharged) from OP [facility] Ad[facility] (Discharged) [facility] Ad[facility] (Discharged) from [facility]09[facility] Ad[facility] (Discharged) [facility]  Alcohol Use Disorder Identification Test Final Score (AUDIT) 0 0 0 3   GAD-7    Flowsheet Row Clinical Support from 03/26/2024 in [facility] Office Visit from 01/30/2024 in [facility] Office Visit from 12/05/2023 in [facility] Counselor from 09/09/2023 in [facility] Office Visit from 05/23/2023 in [facility]  Total GAD-7 Score 1 2 5 12  0   PHQ2-9    Flowsheet Row Clinical Support from 03/26/2024 in [facility] Office Visit from 01/30/2024 in [facility] Office Visit from 01/14/2024 in [facility] Office Visit from 12/05/2023 in [facility] ED from 09/25/2023 in [facility]  PHQ-2 Total Score 2 3 3 3  0  PHQ-9 Total Score 6 6 6 13  0   Flowsheet Row Clinical Support from 03/26/2024 in [facility] Office Visit from 01/30/2024 in [facility] Office Visit from 12/05/2023 in [facility]  C-SSRS RISK CATEGORY No Risk Error: Q7 should not be  populated when Q6 is No Error: Q7 should not be populated when Q6 is No     Assessment and Plan: Patient notes that he his anxiety, depression, sleep, and mood has improved since his last visit. No medication changes made today. Patient agreeable to continue medications as prescribed. Patient will continue diet and exercise to help manage weight.  1. Schizoaffective disorder, bipolar type (HCC)  Continue- risperiDONE  ER (UZEDY ) 125 MG/0.35ML SUSY; Inject 0.35 mLs (125 mg total) into the skin every 28 (twenty-eight) days.  Dispense: 0.35 mL; Refill: 11  2. Generalized anxiety disorder (Primary)  Continue- hydrOXYzine  (ATARAX ) 10 MG tablet; Take 1 tablet (10 mg total) by mouth 3 (three) times daily as needed.  Dispense: 30 tablet; Refill: 3  3. Insomnia, unspecified type  Continue- traZODone  (DESYREL ) 100 MG tablet; Take 1 tablet (100 mg total) by mouth at bedtime.  Dispense: 30 tablet; Refill: 3 Continue- hydrOXYzine  (ATARAX ) 10 MG tablet; Take 1 tablet (10 mg total) by mouth 3 (three) times daily as needed.  Dispense: 30 tablet; Refill: 3        Collaboration of Care: Collaboration of Care: Other provider involved in patient's care [facility] staff and PCP  Patient/Guardian was advised Release of Information must be obtained prior to any record release in order to collaborate their care with an outside provider. Patient/Guardian was advised if they have not already done so to contact the registration department to sign all necessary forms in order for us  to release information regarding their care.   Consent: Patient/Guardian gives verbal consent for treatment and assignment of benefits for services provided during this visit. Patient/Guardian expressed understanding and agreed to proceed.   Follow up in 2.5 months Follow up with shot clinic in a month Zane FORBES Bach, NP 05/26/2024, 9:52 AM

## 2024-05-26 NOTE — Progress Notes (Cosign Needed)
 Patient presented to office for injection of Uzedy  125 mg. Prior to injection patient had visit with provider. Patient recieved injection in left arm. Pateint tolerated injection well. No complaints noted. Pateint will return in 28 days.

## 2024-06-05 ENCOUNTER — Other Ambulatory Visit (HOSPITAL_BASED_OUTPATIENT_CLINIC_OR_DEPARTMENT_OTHER): Payer: Self-pay

## 2024-06-12 ENCOUNTER — Other Ambulatory Visit: Payer: Self-pay

## 2024-06-16 ENCOUNTER — Other Ambulatory Visit: Payer: Self-pay

## 2024-06-16 NOTE — Progress Notes (Signed)
 Specialty Pharmacy Refill Coordination Note  Lawrence [name] is a 28 y.o. male contacted today regarding refills of specialty medication(s) risperiDONE  (Uzedy )   Patient requested Courier to Provider Office   Delivery date: 06/18/24   Verified address: [facility] [address]   Medication will be filled on: 06/17/24     Appointment 06/23/24.

## 2024-06-23 ENCOUNTER — Encounter (HOSPITAL_COMMUNITY): Payer: Self-pay

## 2024-06-23 ENCOUNTER — Ambulatory Visit (INDEPENDENT_AMBULATORY_CARE_PROVIDER_SITE_OTHER): Payer: MEDICAID

## 2024-06-23 VITALS — BP 120/73 | HR 73 | Wt 270.6 lb

## 2024-06-23 DIAGNOSIS — F411 Generalized anxiety disorder: Secondary | ICD-10-CM

## 2024-06-23 DIAGNOSIS — F200 Paranoid schizophrenia: Secondary | ICD-10-CM

## 2024-06-23 DIAGNOSIS — F250 Schizoaffective disorder, bipolar type: Secondary | ICD-10-CM

## 2024-06-23 NOTE — Progress Notes (Signed)
 Pt presents today for injection of Uzedy  125mg  given in RIGHT- arm with no complains. Pt denies all AVH, SI, and HI. Pt states that he is doing good overall.

## 2024-07-07 ENCOUNTER — Other Ambulatory Visit (HOSPITAL_COMMUNITY): Payer: Self-pay

## 2024-07-08 ENCOUNTER — Other Ambulatory Visit (HOSPITAL_COMMUNITY): Payer: Self-pay

## 2024-07-13 ENCOUNTER — Other Ambulatory Visit: Payer: Self-pay

## 2024-07-13 NOTE — Progress Notes (Signed)
 Patient now using [employer]. Dis-enrolling.

## 2024-07-20 ENCOUNTER — Other Ambulatory Visit (HOSPITAL_BASED_OUTPATIENT_CLINIC_OR_DEPARTMENT_OTHER): Payer: Self-pay

## 2024-07-20 ENCOUNTER — Ambulatory Visit (INDEPENDENT_AMBULATORY_CARE_PROVIDER_SITE_OTHER): Payer: MEDICAID | Admitting: Family Medicine

## 2024-07-20 ENCOUNTER — Encounter: Payer: Self-pay | Admitting: Family Medicine

## 2024-07-20 VITALS — BP 112/80 | HR 80 | Temp 97.9°F | Ht 73.0 in | Wt 272.5 lb

## 2024-07-20 DIAGNOSIS — R7989 Other specified abnormal findings of blood chemistry: Secondary | ICD-10-CM

## 2024-07-20 DIAGNOSIS — R7303 Prediabetes: Secondary | ICD-10-CM | POA: Diagnosis not present

## 2024-07-20 DIAGNOSIS — E221 Hyperprolactinemia: Secondary | ICD-10-CM

## 2024-07-20 LAB — POCT GLYCOSYLATED HEMOGLOBIN (HGB A1C): Hemoglobin A1C: 6.1 % — AB (ref 4.0–5.6)

## 2024-07-20 LAB — TSH: TSH: 1.39 u[IU]/mL (ref 0.35–5.50)

## 2024-07-20 MED ORDER — METFORMIN HCL 500 MG PO TABS
500.0000 mg | ORAL_TABLET | Freq: Two times a day (BID) | ORAL | 5 refills | Status: AC
  Filled 2024-07-20: qty 60, 30d supply, fill #0

## 2024-07-20 NOTE — Progress Notes (Signed)
 Established Patient Office Visit  Subjective   Patient ID: Lawrence [name], male    DOB: 01-30-1996  Age: 28 y.o. MRN: [id number]  Chief Complaint  Patient presents with   Medical Management of Chronic Issues    HPI Discussed the use of AI scribe software for clinical note transcription with the patient, who gave verbal consent to proceed.  History of Present Illness   Lawrence [name] is a 28 year old male who presents for monitoring of blood sugars. He lives with his mother, who prefers him to remain on medication.  He is being monitored for blood sugar, with a recent A1c of 6.1, up from prior values of 5.8 to 5.9. He is not on diabetes medication and has no symptoms of hyperglycemia.  He receives monthly risperidone  injections and has gained weight from 210 to 272 pounds over the past few years. His mother prefers he stay on the medication.  He has elevated prolactin and an abnormal thyroid  test that are being monitored. He is seeking a new psychiatrist and continues risperidone  injections at [facility].  He asks about HIV testing. He reports no penetrative sex and remote history of oral sex in high school.       Current Outpatient Medications  Medication Instructions   hydrOXYzine  (ATARAX ) 10 mg, Oral, 3 times daily PRN   metFORMIN (GLUCOPHAGE) 500 mg, Oral, 2 times daily with meals   traZODone  (DESYREL ) 100 mg, Oral, Daily at bedtime   Uzedy  125 mg, Subcutaneous, Every 28 days    Patient Active Problem List   Diagnosis Date Noted   Generalized anxiety disorder 03/26/2024   Insomnia 03/26/2024   Substance abuse (HCC) 09/25/2023   Use of cannabis 09/12/2023   Prediabetes 04/12/2023   Well adult exam 02/28/2023   Schizophrenia (HCC) 11/11/2019   Schizoaffective disorder, bipolar type (HCC) 01/09/2017   Intermittent explosive disorder 05/13/2016   Intoxication by drug (HCC) 07/12/2014   Intoxication 07/12/2014   Depression 07/12/2014    Overdose of antitussive       Review of Systems  All other systems reviewed and are negative.     Objective:     BP 112/80   Pulse 80   Temp 97.9 F (36.6 C) (Oral)   Ht 6' 1 (1.854 m)   Wt 272 lb 8 oz (123.6 kg)   SpO2 97%   BMI 35.95 kg/m    Physical Exam Vitals reviewed.  Constitutional:      Appearance: Normal appearance. He is well-groomed. He is obese.  Cardiovascular:     Rate and Rhythm: Normal rate and regular rhythm.     Heart sounds: S1 normal and S2 normal. No murmur heard. Pulmonary:     Effort: Pulmonary effort is normal.     Breath sounds: Normal air entry.  Abdominal:     General: Abdomen is flat.  Neurological:     General: No focal deficit present.     Mental Status: He is alert and oriented to person, place, and time.     Gait: Gait is intact.  Psychiatric:        Mood and Affect: Mood and affect normal.      Results for orders placed or performed in visit on 07/20/24  POC HgB A1c  Result Value Ref Range   Hemoglobin A1C 6.1 (A) 4.0 - 5.6 %   HbA1c POC (<> result, manual entry)     HbA1c, POC (prediabetic range)     HbA1c, POC (controlled  diabetic range)        The ASCVD Risk score (Arnett DK, et al., 2019) failed to calculate for the following reasons:   The 2019 ASCVD risk score is only valid for ages 87 to 51    Assessment & Plan:  Prediabetes -     POCT glycosylated hemoglobin (Hb A1C) -     Collection capillary blood specimen -     metFORMIN HCl; Take 1 tablet (500 mg total) by mouth 2 (two) times daily with a meal.  Dispense: 60 tablet; Refill: 5  Elevated TSH -     TSH; Future  Hyperprolactinemia -     Prolactin; Future   Assessment and Plan    Prediabetes associated with antipsychotic therapy A1c increased to 6.1, indicating prediabetes, likely due to risperidone  therapy. No need for diabetes medications at this time. - Continue to monitor blood glucose levels regularly. - Started metformin 500 mg twice daily to  manage blood glucose and assist with weight management. - Discussed potential side effects of metformin, including upset stomach and diarrhea, which may resolve over time. - Encouraged communication with psychiatrist regarding medication management and potential alternatives.  Medication-induced weight gain Weight increased from 210 lbs to 272 lbs, likely due to risperidone  therapy. BMI is 35-36, indicating obesity. - Started metformin 500 mg twice daily to assist with weight management. - Encouraged discussion with psychiatrist about potential alternative medications to manage psychiatric symptoms without weight gain. - Continue lifestyle modifications, including diet and exercise, as previously discussed.  Hyperprolactinemia Requires monitoring. - Ordered prolactin level blood test.  Abnormal blood chemistry monitoring Previous abnormal thyroid  test requires re-evaluation. - Ordered thyroid  function tests.        Return in about 6 months (around 01/18/2025) for annual physical exam.    Heron CHRISTELLA Sharper, MD

## 2024-07-21 ENCOUNTER — Ambulatory Visit: Payer: Self-pay | Admitting: Family Medicine

## 2024-07-21 LAB — PROLACTIN: Prolactin: 35.5 ng/mL — ABNORMAL HIGH (ref 2.0–18.0)

## 2024-07-22 ENCOUNTER — Other Ambulatory Visit: Payer: Self-pay

## 2024-07-22 ENCOUNTER — Other Ambulatory Visit (HOSPITAL_BASED_OUTPATIENT_CLINIC_OR_DEPARTMENT_OTHER): Payer: Self-pay

## 2024-07-22 ENCOUNTER — Other Ambulatory Visit (HOSPITAL_COMMUNITY): Payer: Self-pay

## 2024-07-22 MED ORDER — UZEDY 125 MG/0.35ML ~~LOC~~ SUSY
PREFILLED_SYRINGE | SUBCUTANEOUS | 0 refills | Status: AC
  Filled 2024-07-24: qty 0.35, 28d supply, fill #0
  Filled 2024-08-17 (×2): qty 0.35, 28d supply, fill #1

## 2024-07-22 NOTE — Progress Notes (Signed)
 Prescription was sent in by different doctor so we can try to fill. Insurance will pay again on 12/5. Re-enrolling.

## 2024-07-22 NOTE — Progress Notes (Signed)
 A prescription for Uzedy  has been called in for the patient. Per the note dated 07/13/24, the patient is now using [employer]. A message has been routed to Tiffany to verify whether the patient is still using [employer].

## 2024-07-23 ENCOUNTER — Ambulatory Visit (HOSPITAL_COMMUNITY): Payer: MEDICAID

## 2024-07-24 ENCOUNTER — Other Ambulatory Visit: Payer: Self-pay

## 2024-07-24 NOTE — Progress Notes (Signed)
 Pharmacy Patient Advocate Encounter  Insurance verification completed.   The patient is insured through [place] [employer]   Ran test claim for Uzedy . Co-pay is $4.  This test claim was processed through [facility]- copay amounts may vary at other pharmacies due to pharmacy/plan contracts, or as the patient moves through the different stages of their insurance plan.

## 2024-07-24 NOTE — Progress Notes (Signed)
 Specialty Pharmacy Initial Fill Coordination Note  Lawrence [name] is a 28 y.o. male contacted today regarding initial fill of specialty medication(s) risperiDONE  (Uzedy )   Patient requested Courier to Provider Office   Delivery date: 07/28/24   Verified address: [facility] [address]   Medication will be filled on: 07/27/24   Patient is aware of $4 copayment.

## 2024-07-27 ENCOUNTER — Other Ambulatory Visit: Payer: Self-pay

## 2024-07-27 ENCOUNTER — Other Ambulatory Visit (HOSPITAL_COMMUNITY): Payer: Self-pay

## 2024-08-17 ENCOUNTER — Other Ambulatory Visit (HOSPITAL_COMMUNITY): Payer: Self-pay

## 2024-08-18 ENCOUNTER — Other Ambulatory Visit: Payer: Self-pay

## 2024-08-18 NOTE — Progress Notes (Signed)
 Behavorial Health called to tell us  that he will be filling at another pharmacy.  Disenrolling from Specialty Pharmacy services.

## 2024-09-01 ENCOUNTER — Telehealth (HOSPITAL_COMMUNITY): Payer: Self-pay

## 2024-09-01 ENCOUNTER — Encounter (HOSPITAL_COMMUNITY): Payer: Self-pay

## 2024-09-01 NOTE — Telephone Encounter (Signed)
 Called this pt to get injection appointment that was missed on 07/23/24 rescheduled, pt did not answer. I did LVM & sent pt a MyChart msg asking patient to call the office to get rescheduled. Thanks!

## 2024-09-16 ENCOUNTER — Other Ambulatory Visit: Payer: Self-pay

## 2024-09-23 ENCOUNTER — Other Ambulatory Visit (HOSPITAL_BASED_OUTPATIENT_CLINIC_OR_DEPARTMENT_OTHER): Payer: Self-pay

## 2024-09-23 ENCOUNTER — Ambulatory Visit (INDEPENDENT_AMBULATORY_CARE_PROVIDER_SITE_OTHER): Payer: MEDICAID | Admitting: Family

## 2024-09-23 ENCOUNTER — Ambulatory Visit (INDEPENDENT_AMBULATORY_CARE_PROVIDER_SITE_OTHER): Payer: MEDICAID

## 2024-09-23 ENCOUNTER — Encounter (HOSPITAL_COMMUNITY): Payer: Self-pay

## 2024-09-23 VITALS — BP 125/69 | HR 87 | Ht 73.0 in | Wt 274.2 lb

## 2024-09-23 DIAGNOSIS — F250 Schizoaffective disorder, bipolar type: Secondary | ICD-10-CM

## 2024-09-23 DIAGNOSIS — F411 Generalized anxiety disorder: Secondary | ICD-10-CM | POA: Diagnosis not present

## 2024-09-23 DIAGNOSIS — G4700 Insomnia, unspecified: Secondary | ICD-10-CM

## 2024-09-23 MED ORDER — HYDROXYZINE HCL 10 MG PO TABS
10.0000 mg | ORAL_TABLET | Freq: Three times a day (TID) | ORAL | 1 refills | Status: AC | PRN
  Filled 2024-09-23: qty 30, 10d supply, fill #0

## 2024-09-23 NOTE — Progress Notes (Cosign Needed)
 Patient presented to office for injection of Uzedy  125 mg with grandmother. Prior to injection patient had visit with provider. Patient recieved injection in left arm. Pateint tolerated injection well. No complaints noted. Pateint will return in 28 days. Patient left clinic alert and ambulatory.

## 2024-09-23 NOTE — Progress Notes (Cosign Needed)
 BH MD/PA/NP OP Progress Note  09/23/2024 2:34 PM Lawrence [name]  MRN:  [id number]  Chief Complaint:  Long acting injection clinic  HPI:  Lawrence [name] is a 29 year old African-American male presents for long-acting injectable appointment for  Uzedy  125 mg/ 0.68ml.  Q. every 28 days.  Patient is accompanied by his grandmother Lawrence [name].  He provided verbal authorization for her to stay during this assessment.  She reports concerns related to sleep disturbance and medication compliance.  [name] reported missed appointment 07/2024 because his mother wanted him to be followed by therapist psychiatrist closer to their home.  Reports initial intake at [facility] on [address].  Denied medication recommendations/changes at that visit.  [name] denied suicidal or homicidal ideations.  Denied auditory and visual hallucinations.  Does report inability to rest well at night.  States he has been up for 2 or 3 nights consecutively due to ruminations.  Patient encouraged to utilize hydroxyzine  10 to 20 mg nightly for sleep disturbance currently prescribed trazodone  100 mg nightly.   Aims 0, denied restlessness, akathisia does report increased anxiety.  Current diagnoses related to schizoaffective bipolar type, schizophrenia, substance-induced mood disorder, major depressive disorder and generalized anxiety disorder.   Chart review prolactin abnormal 35.5, TSH 1.39 within normal limits A1c 6.1 currently prescribed metformin  denied taking medications as directed.  Patient encouraged to take medication as directed.  No other concerns noted at this visit.  Support encouragement reassurance was provided.  Visit Diagnosis:    ICD-10-CM   1. Generalized anxiety disorder  F41.1 hydrOXYzine  (ATARAX ) 10 MG tablet    2. Insomnia, unspecified type  G47.00 hydrOXYzine  (ATARAX ) 10 MG tablet      Past Psychiatric History: H/O schizophrenia, schizoaffective-bipolar type intermittent explosive  disorder, substance-induced mood disorder and major depressive disorder.  Past Medical History:  Past Medical History:  Diagnosis Date   ADD (attention deficit disorder)    Asthma    Depressed    Schizoaffective disorder (HCC)    No past surgical history on file.  Family Psychiatric History:   Family History: No family history on file.  Social History:  Social History   Socioeconomic History   Marital status: Single    Spouse name: Not on file   Number of children: Not on file   Years of education: Not on file   Highest education level: 12th grade  Occupational History   Occupation: Unemployed  Tobacco Use   Smoking status: Former    Current packs/day: 0.00    Types: Cigarettes    Quit date: 04/11/2023    Years since quitting: 1.4   Smokeless tobacco: Never  Vaping Use   Vaping status: Never Used  Substance and Sexual Activity   Alcohol use: Not Currently   Drug use: Not Currently    Frequency: 2.0 times per week    Types: Marijuana   Sexual activity: Never  Other Topics Concern   Not on file  Social History Narrative   Pt is unemployed; lives with grandmother   Social Drivers of Health   Tobacco Use: Medium Risk (09/23/2024)   Patient History    Smoking Tobacco Use: Former    Smokeless Tobacco Use: Never    Passive Exposure: Not on file  Financial Resource Strain: High Risk (01/13/2024)   Overall Financial Resource Strain (CARDIA)    Difficulty of Paying Living Expenses: Very hard  Food Insecurity: No Food Insecurity (01/13/2024)   Hunger Vital Sign    Worried About Running Out of  Food in the Last Year: Never true    Ran Out of Food in the Last Year: Never true  Transportation Needs: No Transportation Needs (01/13/2024)   PRAPARE - Administrator, Civil Service (Medical): No    Lack of Transportation (Non-Medical): No  Physical Activity: Unknown (01/13/2024)   Exercise Vital Sign    Days of Exercise per Week: 0 days    Minutes of Exercise per  Session: Not on file  Stress: Stress Concern Present (01/13/2024)   [employer] of Occupational Health - Occupational Stress Questionnaire    Feeling of Stress : Rather much  Social Connections: Socially Isolated (01/13/2024)   Social Connection and Isolation Panel    Frequency of Communication with Friends and Family: Never    Frequency of Social Gatherings with Friends and Family: Never    Attends Religious Services: Never    Database Administrator or Organizations: No    Attends Engineer, Structural: Not on file    Marital Status: Never married  Depression (PHQ2-9): Medium Risk (07/20/2024)   Depression (PHQ2-9)    PHQ-2 Score: 7  Alcohol Screen: Not on file  Housing: Unknown (01/13/2024)   Housing Stability Vital Sign    Unable to Pay for Housing in the Last Year: Patient declined    Number of Times Moved in the Last Year: 0    Homeless in the Last Year: No  Utilities: Not At Risk (09/26/2023)   AHC Utilities    Threatened with loss of utilities: No  Health Literacy: Not on file    Allergies: Allergies[1]  Metabolic Disorder Labs: Lab Results  Component Value Date   HGBA1C 6.1 (A) 07/20/2024   MPG 120 08/13/2022   MPG 114.02 11/14/2019   Lab Results  Component Value Date   PROLACTIN 35.5 (H) 07/20/2024   PROLACTIN 52.9 (H) 01/14/2024   Lab Results  Component Value Date   CHOL 157 01/14/2024   TRIG 183.0 (H) 01/14/2024   HDL 35.20 (L) 01/14/2024   CHOLHDL 4 01/14/2024   VLDL 36.6 01/14/2024   LDLCALC 85 01/14/2024   LDLCALC 61 08/13/2022   Lab Results  Component Value Date   TSH 1.39 07/20/2024   TSH 5.681 (H) 09/25/2023    Therapeutic Level Labs: No results found for: LITHIUM No results found for: VALPROATE Lab Results  Component Value Date   CBMZ 8.1 11/14/2019    Current Medications: Current Outpatient Medications  Medication Sig Dispense Refill   hydrOXYzine  (ATARAX ) 10 MG tablet Take 1 tablet (10 mg total) by mouth 3 (three)  times daily as needed. 30 tablet 1   metFORMIN  (GLUCOPHAGE ) 500 MG tablet Take 1 tablet (500 mg total) by mouth 2 (two) times daily with a meal. 60 tablet 5   risperiDONE  ER (UZEDY ) 125 MG/0.35ML SUSY Inject 0.35 mLs (125 mg total) into the skin every 28 (twenty-eight) days. 0.35 mL 11   risperiDONE  ER (UZEDY ) 125 MG/0.35ML SUSY Inject sub Q every 4 weeks 1 mL 0   traZODone  (DESYREL ) 100 MG tablet Take 1 tablet (100 mg total) by mouth at bedtime. 30 tablet 1   Current Facility-Administered Medications  Medication Dose Route Frequency Provider Last Rate Last Admin   risperiDONE  ER (UZEDY ) SUSY 125 mg  125 mg Subcutaneous Q28 days    125 mg at 09/23/24 1423     Musculoskeletal: Strength & Muscle Tone: within normal limits Gait & Station: normal Patient leans: N/A  Psychiatric Specialty Exam: Review of Systems  There  were no vitals taken for this visit.There is no height or weight on file to calculate BMI.  General Appearance: Casual  Eye Contact:  Good  Speech:  Clear and Coherent  Volume:  Normal  Mood:  Anxious and Depressed  Affect:  Congruent  Thought Process:  Coherent  Orientation:  Full (Time, Place, and Person)  Thought Content: Logical   Suicidal Thoughts:  No  Homicidal Thoughts:  No  Memory:  Immediate;   Good Recent;   Good  Judgement:  Good  Insight:  Good  Psychomotor Activity:  Normal  Concentration:  Concentration: Good  Recall:  Good  Fund of Knowledge: Good  Language: Good  Akathisia:  No  Handed:  Right  AIMS (if indicated): not done  Assets:  Communication Skills Desire for Improvement  ADL's:  Intact  Cognition: WNL  Sleep:  Poor   Screenings: AIMS    Flowsheet Row Clinical Support from 03/26/2024 in [facility] Office Visit from 12/05/2023 in [facility] Office Visit from 05/23/2023 in [facility] Office Visit from 12/25/2022 in [facility]Dis[facility]  AIMS Total Score 0 0 0 0 0   AUDIT    Flowsheet Row Admission (Discharged) from OP Visit [facility] Ad[facility] (Discharged) from [facility] Ad[facility] (Discharged[facility] from [facility] Ad[facility] (Discharged[facility] from [facility]  Alcohol Use Disorder Identification Test Final Score (AUDIT) 0 0 0 3   GAD-7    Flowsheet Row Office Visit from 07/20/2024 in [facility] [place] Clinical Support from 05/26/2024 in [facility] Clinical Support from 03/26/2024 in [facility] Office Visit from 01/30/2024 in [facility] Office Visit from 12/05/2023 in [facility]  Total GAD-7 Score 1 3 1 2 5    PHQ2-9    Flowsheet Row Office Visit from 07/20/2024 in [facility] [place] Clinical Support from 05/26/2024 in [facility] Clinical Support from 03/26/2024 in [facility] Office Visit from 01/30/2024 in [facility] Office Visit from 01/14/2024 in [facility] [place]  PHQ-2 Total Score 2 4 2 3 3   PHQ-9 Total Score 7 8 6 6 6    Flowsheet Row Clinical Support from 03/26/2024 in [facility] Office Visit from 01/30/2024 in [facility] Office Visit from 12/05/2023 in [facility]  C-SSRS RISK CATEGORY No Risk Error: Q7 should not be populated when Q6 is No Error: Q7 should not be populated when Q6 is No     Assessment and Plan:  Milos Milligan 29 year old African-American male carries a psychiatric  diagnoses related to schizophrenia, schizoaffective-bipolar type intermittent explosive disorder, substance-induced mood disorder and major depressive disorder.  - Currently prescribed risperidone  ER  Uzedy  125/035 ML q. every 28 days - Per chart review elevated prolactin levels  - Consideration for medication adjustment to include starting initiating Abilify maintain.   Collaboration of Care: Collaboration of Care: Medication Management AEB continue Uzedy  125 mg consideration for medication taper due to elevated prolactin level follow-up, co considerations for initiating Abilify   Patient/Guardian was advised Release of Information must be obtained prior  to any record release in order to collaborate their care with an outside provider. Patient/Guardian was advised if they have not already done so to contact the registration department to sign all necessary forms in order for us  to release information regarding their care.   Consent: Patient/Guardian gives verbal consent for treatment and assignment of benefits for services provided during this visit. Patient/Guardian expressed understanding and agreed to proceed.    Staci LOISE Kerns, NP 09/23/2024, 2:34 PM     [1]  Allergies Allergen Reactions   Bee Venom Anaphylaxis   Apple Juice Other (See Comments)    Gums itch or hurt   Corn-Containing Products Cough   Lactose Other (See Comments)    GI upset   Milk (Cow) Itching and Other (See Comments)   Other Rash and Other (See Comments)    Green Beans: Asthma   Peanut Butter Flavoring Agent (Non-Screening) Cough

## 2024-09-24 NOTE — Progress Notes (Unsigned)
 BH MD/PA/NP OP Progress Note  09/24/2024 4:15 PM Lawrence [name]  MRN:  [id number]  Assessment and Plan: Patient presents for a follow up appointment, previously seen by Zane Bach NP most recently on 05/2024 in which he was prescribed Uzedy  125 mg monthly, hydroxyzine  10 mg TID PRN, and trazodone  100 mg at bedtime. Today, ***.   # Schizoaffective disorder, bipolar type - Uzedy  125 MG/0.35 mL every 28 days  - Antipsychotic monitoring labs last resulted 07/2024 for TSH (WNL), A1c (6.1), lipids on 12/2023 (elevated triglycerides)  - Previously declined metformin   # Generalized anxiety disorder  - hydroxyzine  10 mg TID PRN  # Insomnia - trazodone  100 mg at bedtime  Subjective:  Chief Complaint: med mgmt  Interval History:  Patient seen ***.  Patient reports feeling *** today. Regarding psychiatric symptoms, ***. Patient reports the medications are ***. Patient reports the following adverse effects: ***.  Patient reports *** sleep, ***. Patient reports *** appetite, ***.   Since the previous visit, ***. Stressors include ***.   Patient denies current SI, HI, and AVH. ***   Past Psychiatric History: depression, intermittent explosive disorder, schizoaffective disorder bipolar type, and schizophrenia  Past Medical History:  Past Medical History:  Diagnosis Date   ADD (attention deficit disorder)    Asthma    Depressed    Schizoaffective disorder (HCC)    No past surgical history on file.  Family Psychiatric History: Patient denies family history of psychiatric illness; however, patient's grandmother states that the patient's mother saw a psychologist during her 12th grade year of high school.  Patient denies any family members being on medications at this time.   Family History: No family history on file.  Social History:  Social History   Socioeconomic History   Marital status: Single    Spouse name: Not on file   Number of children: Not on file   Years of  education: Not on file   Highest education level: 12th grade  Occupational History   Occupation: Unemployed  Tobacco Use   Smoking status: Former    Current packs/day: 0.00    Types: Cigarettes    Quit date: 04/11/2023    Years since quitting: 1.4   Smokeless tobacco: Never  Vaping Use   Vaping status: Never Used  Substance and Sexual Activity   Alcohol use: Not Currently   Drug use: Not Currently    Frequency: 2.0 times per week    Types: Marijuana   Sexual activity: Never  Other Topics Concern   Not on file  Social History Narrative   Pt is unemployed; lives with grandmother   Social Drivers of Health   Tobacco Use: Medium Risk (09/23/2024)   Patient History    Smoking Tobacco Use: Former    Smokeless Tobacco Use: Never    Passive Exposure: Not on file  Financial Resource Strain: High Risk (01/13/2024)   Overall Financial Resource Strain (CARDIA)    Difficulty of Paying Living Expenses: Very hard  Food Insecurity: No Food Insecurity (01/13/2024)   Hunger Vital Sign    Worried About Running Out of Food in the Last Year: Never true    Ran Out of Food in the Last Year: Never true  Transportation Needs: No Transportation Needs (01/13/2024)   PRAPARE - Administrator, Civil Service (Medical): No    Lack of Transportation (Non-Medical): No  Physical Activity: Unknown (01/13/2024)   Exercise Vital Sign    Days of Exercise per Week: 0 days  Minutes of Exercise per Session: Not on file  Stress: Stress Concern Present (01/13/2024)   [employer] of Occupational Health - Occupational Stress Questionnaire    Feeling of Stress : Rather much  Social Connections: Socially Isolated (01/13/2024)   Social Connection and Isolation Panel    Frequency of Communication with Friends and Family: Never    Frequency of Social Gatherings with Friends and Family: Never    Attends Religious Services: Never    Database Administrator or Organizations: No    Attends Museum/gallery Exhibitions Officer: Not on file    Marital Status: Never married  Depression (PHQ2-9): Medium Risk (07/20/2024)   Depression (PHQ2-9)    PHQ-2 Score: 7  Alcohol Screen: Not on file  Housing: Unknown (01/13/2024)   Housing Stability Vital Sign    Unable to Pay for Housing in the Last Year: Patient declined    Number of Times Moved in the Last Year: 0    Homeless in the Last Year: No  Utilities: Not At Risk (09/26/2023)   AHC Utilities    Threatened with loss of utilities: No  Health Literacy: Not on file    Allergies:  Allergies  Allergen Reactions   Bee Venom Anaphylaxis   Apple Juice Other (See Comments)    Gums itch or hurt   Corn-Containing Products Cough   Lactose Other (See Comments)    GI upset   Milk (Cow) Itching and Other (See Comments)   Other Rash and Other (See Comments)    Green Beans: Asthma   Peanut Butter Flavoring Agent (Non-Screening) Cough    Metabolic Disorder Labs: Lab Results  Component Value Date   HGBA1C 6.1 (A) 07/20/2024   MPG 120 08/13/2022   MPG 114.02 11/14/2019   Lab Results  Component Value Date   PROLACTIN 35.5 (H) 07/20/2024   PROLACTIN 52.9 (H) 01/14/2024   Lab Results  Component Value Date   CHOL 157 01/14/2024   TRIG 183.0 (H) 01/14/2024   HDL 35.20 (L) 01/14/2024   CHOLHDL 4 01/14/2024   VLDL 36.6 01/14/2024   LDLCALC 85 01/14/2024   LDLCALC 61 08/13/2022   Lab Results  Component Value Date   TSH 1.39 07/20/2024   TSH 5.681 (H) 09/25/2023    Therapeutic Level Labs: No results found for: LITHIUM No results found for: VALPROATE Lab Results  Component Value Date   CBMZ 8.1 11/14/2019    Current Medications: Current Outpatient Medications  Medication Sig Dispense Refill   hydrOXYzine  (ATARAX ) 10 MG tablet Take 1 tablet (10 mg total) by mouth 3 (three) times daily as needed. 30 tablet 1   metFORMIN  (GLUCOPHAGE ) 500 MG tablet Take 1 tablet (500 mg total) by mouth 2 (two) times daily with a meal. 60 tablet 5    risperiDONE  ER (UZEDY ) 125 MG/0.35ML SUSY Inject 0.35 mLs (125 mg total) into the skin every 28 (twenty-eight) days. 0.35 mL 11   risperiDONE  ER (UZEDY ) 125 MG/0.35ML SUSY Inject sub Q every 4 weeks 1 mL 0   traZODone  (DESYREL ) 100 MG tablet Take 1 tablet (100 mg total) by mouth at bedtime. 30 tablet 1   Current Facility-Administered Medications  Medication Dose Route Frequency Provider Last Rate Last Admin   risperiDONE  ER (UZEDY ) SUSY 125 mg  125 mg Subcutaneous Q28 days    125 mg at 09/23/24 1423     Musculoskeletal: Strength & Muscle Tone: within normal limits Gait & Station: normal Patient leans: N/A  Psychiatric Specialty Exam: Review of Systems  There were no vitals taken for this visit.There is no height or weight on file to calculate BMI.  General Appearance: Well Groomed  Eye Contact:  Good  Speech:  Clear and Coherent   Volume:  Normal  Mood:  Depressed  Affect:  Appropriate and Congruent  Thought Process:  Coherent, Goal Directed, and Linear  Orientation:  Full (Time, Place, and Person)  Thought Content: WDL and Logical   Suicidal Thoughts:  No  Homicidal Thoughts:  No  Memory:  Immediate;   Good Recent;   Good Remote;   Good  Judgement:  Good  Insight:  Good  Psychomotor Activity:  Normal  Concentration:  Concentration: Good and Attention Span: Good  Recall:  Good  Fund of Knowledge: Good  Language: Good  Akathisia:  No  Handed:  Right  AIMS (if indicated): not done,   Assets:  Communication Skills Desire for Improvement Financial Resources/Insurance Housing Leisure Time Physical Health Social Support Transportation  ADL's:  Intact  Cognition: WNL  Sleep:  Good   Screenings: AIMS    Flowsheet Row Clinical Support from 03/26/2024 in [facility] Office Visit from 12/05/2023 in [facility] Office Visit from 05/23/2023 in [facility] Office Visit from 12/25/2022 in  [facility] Admission (Discharged) [facility]  AIMS Total Score 0 0 0 0 0   AUDIT    Flowsheet Row Admission (Discharged) from OP Visit from 11[facility]11[facility] Admission (Discharged) from 09[facility]20[facility]2017 [facility] Admission (Discharged) from 01/09/2017 [facility] Admission (Discharged) from 05[facility]13/2016 [facility]  Alcohol Use Disorder Identification Test Final Score (AUDIT) 0 0 0 3   GAD-7    Flowsheet Row Office Visit from 07/20/2024 in [facility] [place] [place] Clinical Support from 05/26/2024 in [name] Clinical Support from 03/26/2024 in [facility] Office Visit from 01/30/2024 in [facility] Office Visit from 12/05/2023 in [facility]  Total GAD-7 Score 1 3 1 2 5    PHQ2-9    Flowsheet Row Office Visit from 07/20/2024 in [facility] [place] Clinical Support from 05/26/2024 in [facility] Clinical Support from 03/26/2024 in [facility] Office Visit from 01/30/2024 in [facility] Office Visit from 01/14/2024 in [facility] [place]  PHQ-2 Total Score 2 4 2 3 3   PHQ-9 Total Score 7 8 6 6 6    Flowsheet Row Clinical Support from 03/26/2024 in [facility] Office Visit from 01/30/2024 in [facility] Office Visit from 12/05/2023 in [facility]  C-SSRS RISK CATEGORY No Risk Error: Q7 should not be populated when Q6 is No Error: Q7 should not be populated when Q6 is No   Collaboration of Care: Collaboration of Care: Other provider  involved in patient's care AEB Shot clinic staff and PCP  Patient/Guardian was advised Release of Information must be obtained prior to any record release in order to collaborate their care with an outside provider. Patient/Guardian was advised if they have not already done so to contact the registration department to sign all necessary forms in order for us  to release information regarding their care.   Consent: Patient/Guardian  gives verbal consent for treatment and assignment of benefits for services provided during this visit. Patient/Guardian expressed understanding and agreed to proceed.    Ismael Franco, MD PGY-3 Psychiatry Resident

## 2024-10-01 ENCOUNTER — Encounter (HOSPITAL_COMMUNITY): Payer: MEDICAID | Admitting: Psychiatry

## 2024-10-20 ENCOUNTER — Ambulatory Visit (HOSPITAL_COMMUNITY): Payer: MEDICAID
